# Patient Record
Sex: Female | Born: 1945 | Race: Black or African American | Hispanic: No | Smoking: Former smoker
Health system: Southern US, Community
[De-identification: ages and names within clinical notes are randomized; demographics above are authoritative.]

## PROBLEM LIST (undated history)

## (undated) DIAGNOSIS — F22 Delusional disorders: Secondary | ICD-10-CM

## (undated) DIAGNOSIS — K219 Gastro-esophageal reflux disease without esophagitis: Secondary | ICD-10-CM

## (undated) DIAGNOSIS — B370 Candidal stomatitis: Secondary | ICD-10-CM

## (undated) DIAGNOSIS — M79642 Pain in left hand: Secondary | ICD-10-CM

## (undated) DIAGNOSIS — F0392 Unspecified dementia, unspecified severity, with psychotic disturbance: Secondary | ICD-10-CM

## (undated) DIAGNOSIS — G4700 Insomnia, unspecified: Secondary | ICD-10-CM

## (undated) DIAGNOSIS — I739 Peripheral vascular disease, unspecified: Secondary | ICD-10-CM

## (undated) DIAGNOSIS — E559 Vitamin D deficiency, unspecified: Secondary | ICD-10-CM

## (undated) DIAGNOSIS — H3320 Serous retinal detachment, unspecified eye: Secondary | ICD-10-CM

## (undated) DIAGNOSIS — I10 Essential (primary) hypertension: Secondary | ICD-10-CM

## (undated) DIAGNOSIS — N3281 Overactive bladder: Secondary | ICD-10-CM

## (undated) DIAGNOSIS — F0390 Unspecified dementia without behavioral disturbance: Secondary | ICD-10-CM

## (undated) DIAGNOSIS — M1990 Unspecified osteoarthritis, unspecified site: Secondary | ICD-10-CM

## (undated) DIAGNOSIS — M549 Dorsalgia, unspecified: Secondary | ICD-10-CM

## (undated) DIAGNOSIS — E785 Hyperlipidemia, unspecified: Secondary | ICD-10-CM

## (undated) DIAGNOSIS — H547 Unspecified visual loss: Secondary | ICD-10-CM

## (undated) DIAGNOSIS — G8929 Other chronic pain: Secondary | ICD-10-CM

## (undated) DIAGNOSIS — I129 Hypertensive chronic kidney disease with stage 1 through stage 4 chronic kidney disease, or unspecified chronic kidney disease: Secondary | ICD-10-CM

## (undated) DIAGNOSIS — R0989 Other specified symptoms and signs involving the circulatory and respiratory systems: Secondary | ICD-10-CM

## (undated) DIAGNOSIS — D638 Anemia in other chronic diseases classified elsewhere: Secondary | ICD-10-CM

## (undated) DIAGNOSIS — D692 Other nonthrombocytopenic purpura: Secondary | ICD-10-CM

## (undated) DIAGNOSIS — R443 Hallucinations, unspecified: Secondary | ICD-10-CM

## (undated) HISTORY — DX: Insomnia, unspecified: G47.00

## (undated) HISTORY — DX: Morbid (severe) obesity due to excess calories: E66.01

## (undated) HISTORY — DX: Unspecified dementia, unspecified severity, with psychotic disturbance: F03.92

## (undated) HISTORY — DX: Unspecified dementia without behavioral disturbance: F03.90

## (undated) HISTORY — DX: Hypertensive chronic kidney disease with stage 1 through stage 4 chronic kidney disease, or unspecified chronic kidney disease: I12.9

## (undated) HISTORY — DX: Candidal stomatitis: B37.0

## (undated) HISTORY — DX: Dorsalgia, unspecified: M54.9

## (undated) HISTORY — DX: Serous retinal detachment, unspecified eye: H33.20

## (undated) HISTORY — DX: Gastro-esophageal reflux disease without esophagitis: K21.9

## (undated) HISTORY — DX: Other specified symptoms and signs involving the circulatory and respiratory systems: R09.89

## (undated) HISTORY — PX: CATARACT EXTRACTION, BILATERAL: SHX1313

## (undated) HISTORY — DX: Hyperlipidemia, unspecified: E78.5

## (undated) HISTORY — DX: Pain in left hand: M79.642

## (undated) HISTORY — DX: Anemia in other chronic diseases classified elsewhere: D63.8

## (undated) HISTORY — DX: Other nonthrombocytopenic purpura: D69.2

## (undated) HISTORY — DX: Hallucinations, unspecified: R44.3

## (undated) HISTORY — DX: Other chronic pain: G89.29

## (undated) HISTORY — DX: Vitamin D deficiency, unspecified: E55.9

## (undated) HISTORY — DX: Peripheral vascular disease, unspecified: I73.9

## (undated) HISTORY — DX: Unspecified osteoarthritis, unspecified site: M19.90

## (undated) HISTORY — DX: Unspecified dementia without behavioral disturbance: F22

## (undated) HISTORY — DX: Overactive bladder: N32.81

---

## 2004-12-14 ENCOUNTER — Ambulatory Visit (HOSPITAL_COMMUNITY): Admission: RE | Admit: 2004-12-14 | Discharge: 2004-12-14 | Payer: Self-pay | Admitting: Internal Medicine

## 2004-12-28 ENCOUNTER — Encounter
Admission: RE | Admit: 2004-12-28 | Discharge: 2004-12-28 | Payer: Self-pay | Admitting: Physical Medicine and Rehabilitation

## 2005-08-16 ENCOUNTER — Emergency Department (HOSPITAL_COMMUNITY): Admission: EM | Admit: 2005-08-16 | Discharge: 2005-08-17 | Payer: Self-pay | Admitting: Emergency Medicine

## 2005-08-17 ENCOUNTER — Ambulatory Visit (HOSPITAL_COMMUNITY): Admission: RE | Admit: 2005-08-17 | Discharge: 2005-08-17 | Payer: Self-pay | Admitting: Emergency Medicine

## 2006-02-10 ENCOUNTER — Inpatient Hospital Stay (HOSPITAL_COMMUNITY): Admission: EM | Admit: 2006-02-10 | Discharge: 2006-02-12 | Payer: Self-pay | Admitting: Internal Medicine

## 2006-02-11 ENCOUNTER — Ambulatory Visit: Payer: Self-pay | Admitting: Cardiology

## 2006-02-11 ENCOUNTER — Encounter: Payer: Self-pay | Admitting: Vascular Surgery

## 2006-02-11 ENCOUNTER — Encounter: Payer: Self-pay | Admitting: Cardiology

## 2007-03-08 ENCOUNTER — Inpatient Hospital Stay (HOSPITAL_COMMUNITY): Admission: EM | Admit: 2007-03-08 | Discharge: 2007-03-13 | Payer: Self-pay | Admitting: Emergency Medicine

## 2007-12-18 ENCOUNTER — Ambulatory Visit: Payer: Self-pay | Admitting: Gastroenterology

## 2008-01-18 ENCOUNTER — Encounter: Payer: Self-pay | Admitting: Gastroenterology

## 2008-01-18 ENCOUNTER — Ambulatory Visit: Payer: Self-pay | Admitting: Gastroenterology

## 2008-01-19 ENCOUNTER — Encounter: Payer: Self-pay | Admitting: Gastroenterology

## 2009-06-05 IMAGING — CR DG ELBOW COMPLETE 3+V*R*
4 series · 4 of 4 positions shown · non-contrast
Comparison: none

CLINICAL DATA: Right arm pain following a fall.

RIGHT ELBOW - 4 VIEW

[view not recorded (1 of 4)]
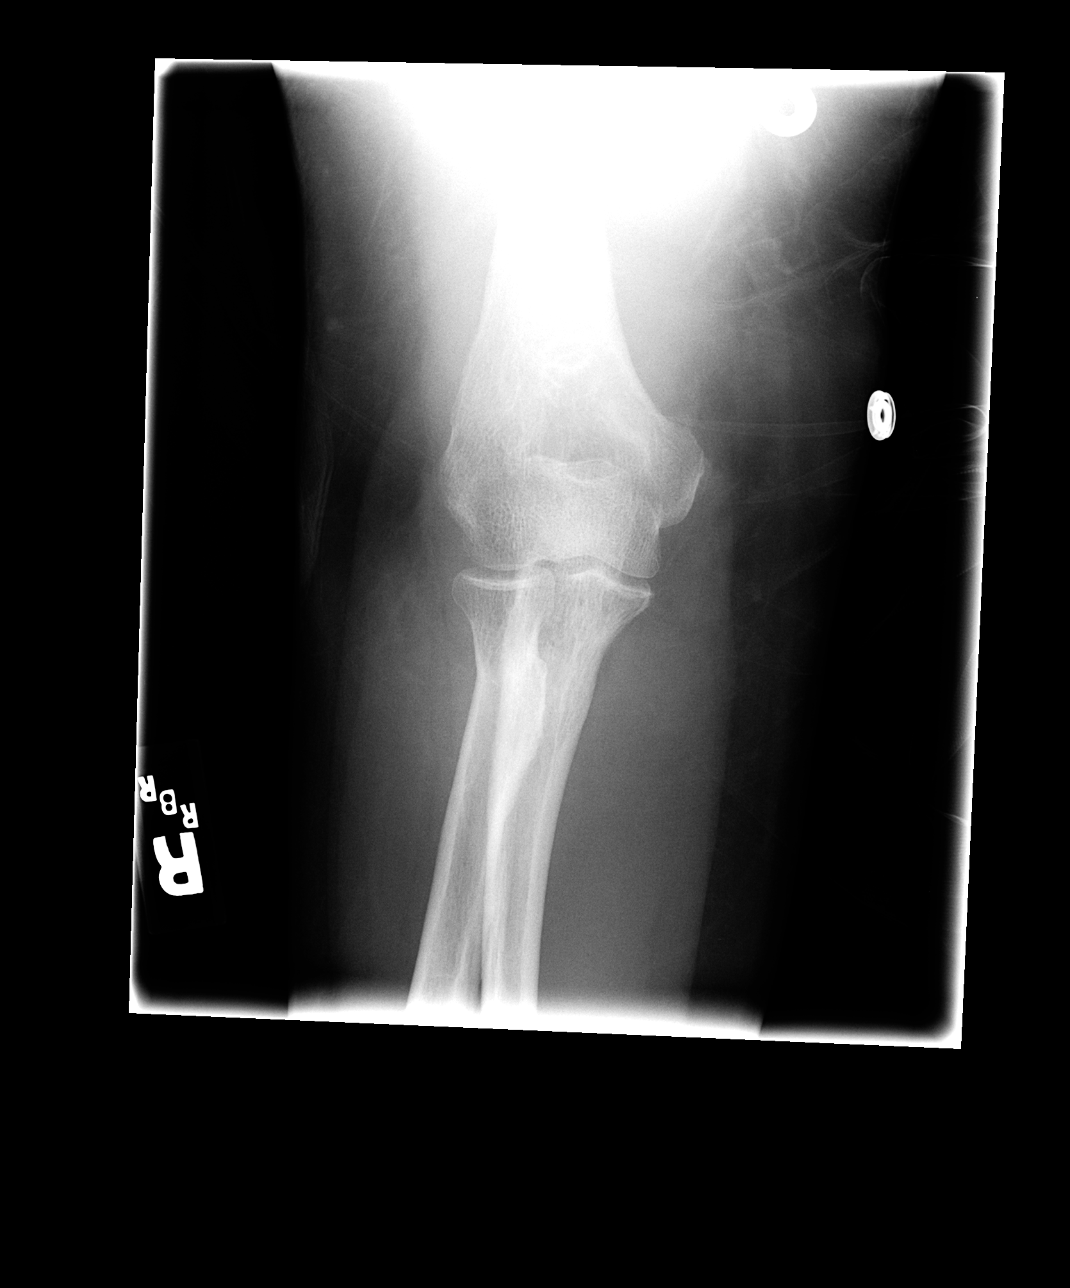

[view not recorded (2 of 4)]
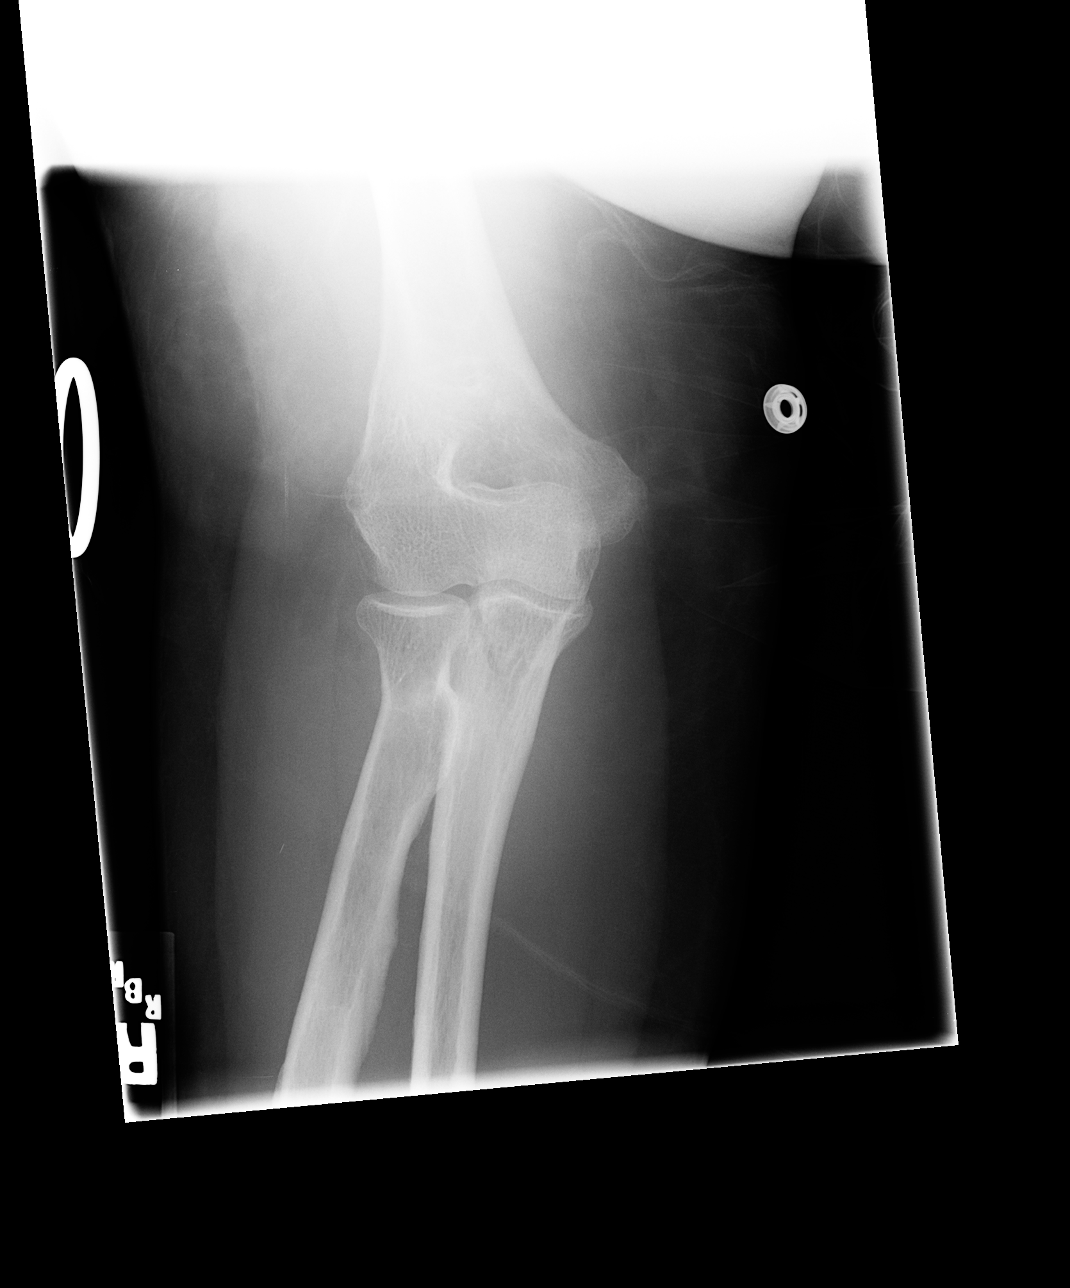

[view not recorded (3 of 4)]
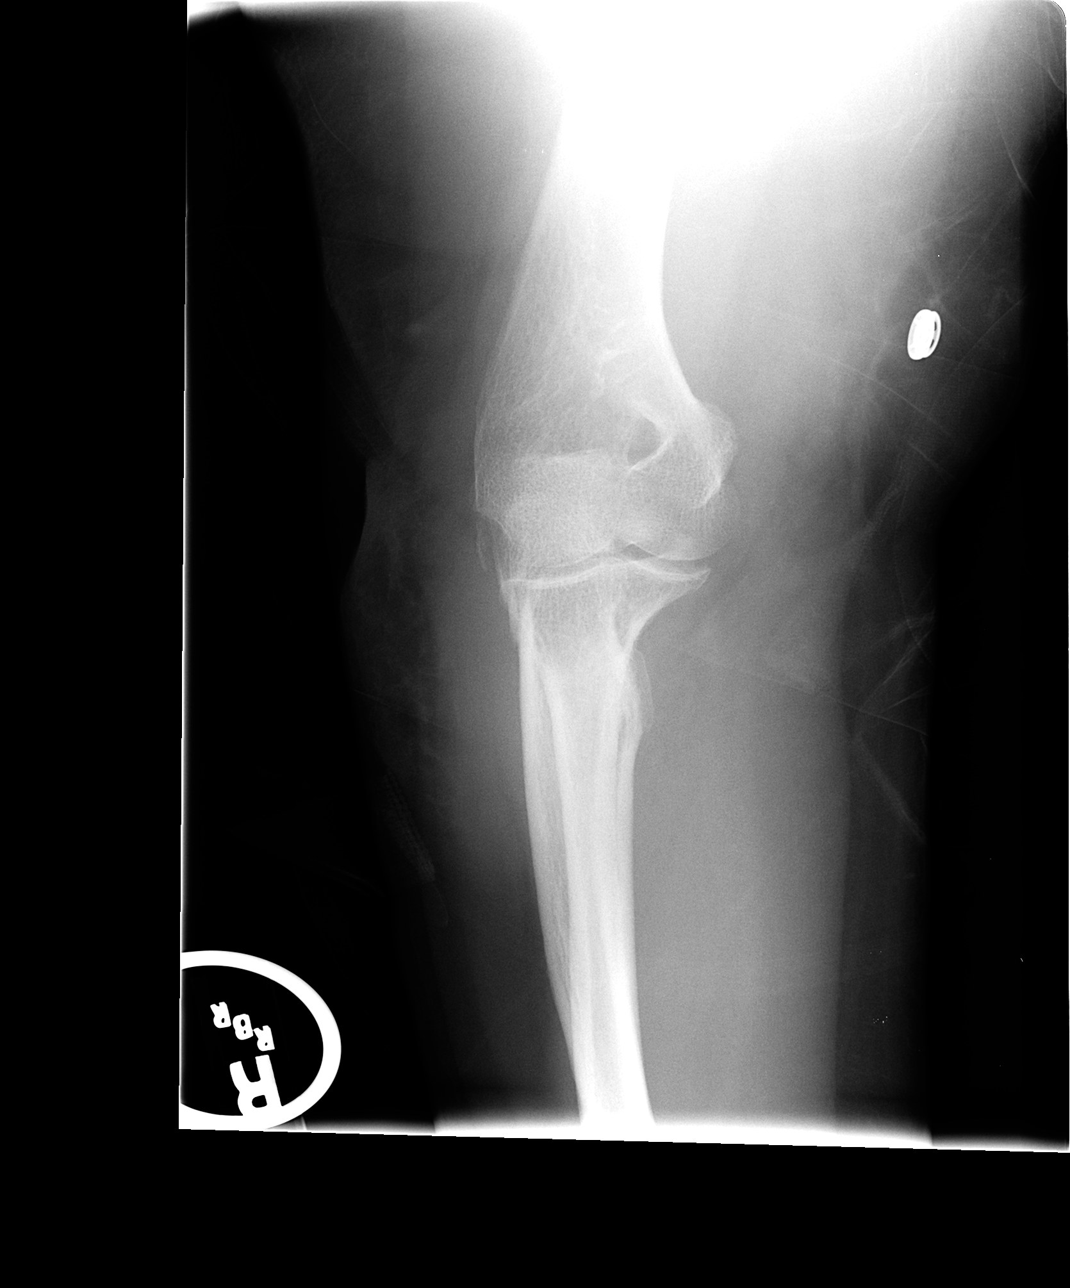

[view not recorded (4 of 4)]
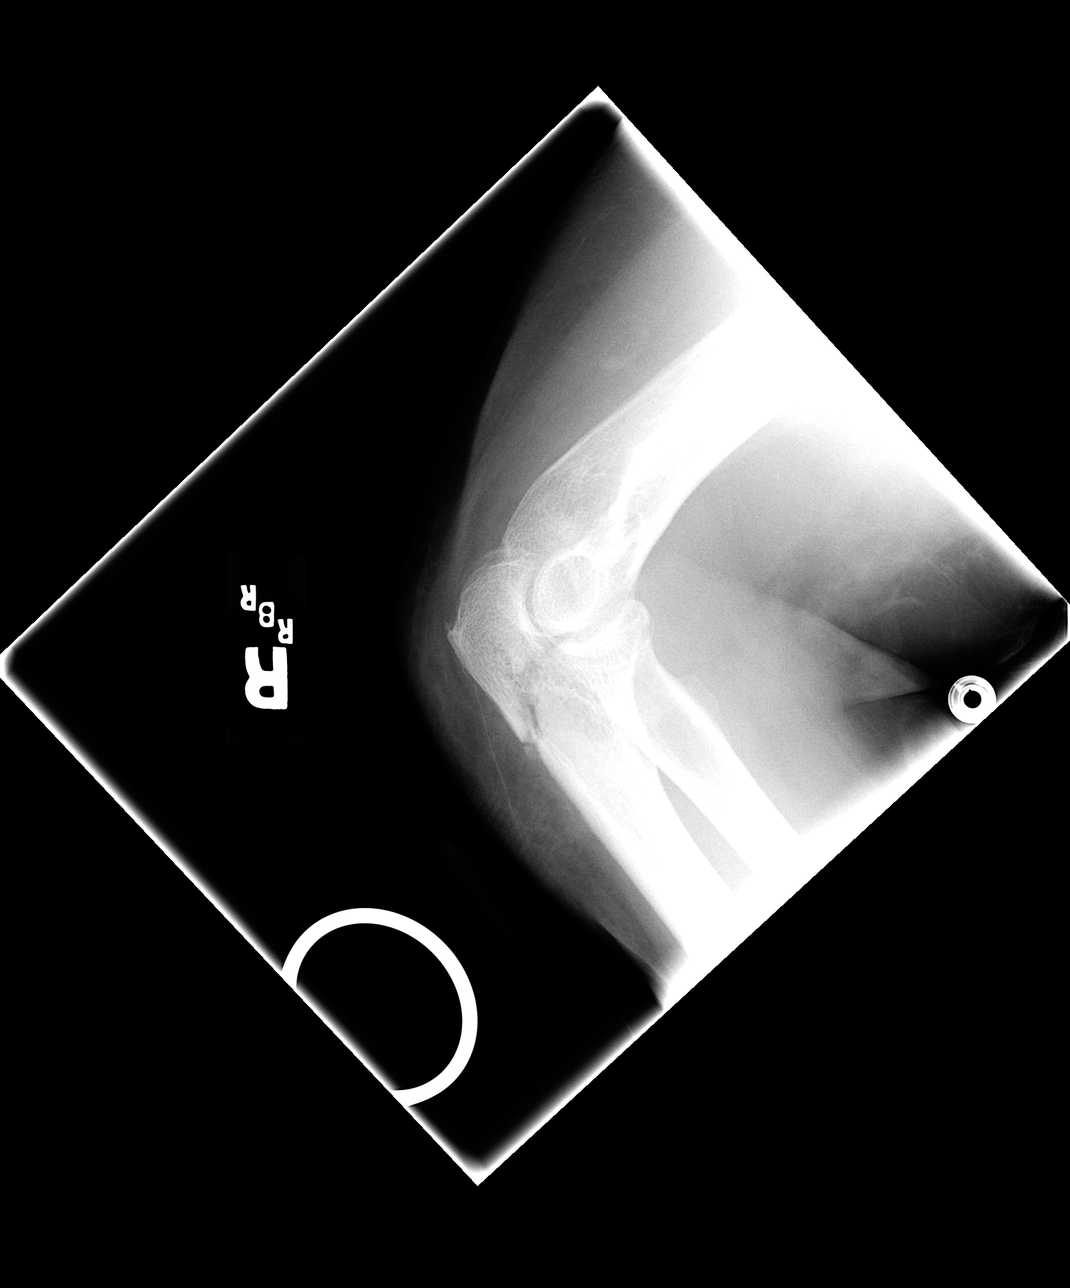

[4 of 4 positions shown; findings below may reference images not displayed]

FINDINGS: Fracture through the proximal olecranon. This is mildly comminuted
with minimal posterior displacement of the distal fragment. An associated elbow
joint effusion is noted.

IMPRESSION

Olecranon fracture with an associated elbow joint effusion.

## 2009-06-05 IMAGING — CR DG HUMERUS 2V *R*
2 series · 2 of 2 positions shown · non-contrast
Comparison: none

CLINICAL DATA: Severe right arm pain following a fall.

RIGHT HUMERUS - 2 VIEW

[view not recorded (1 of 2)]
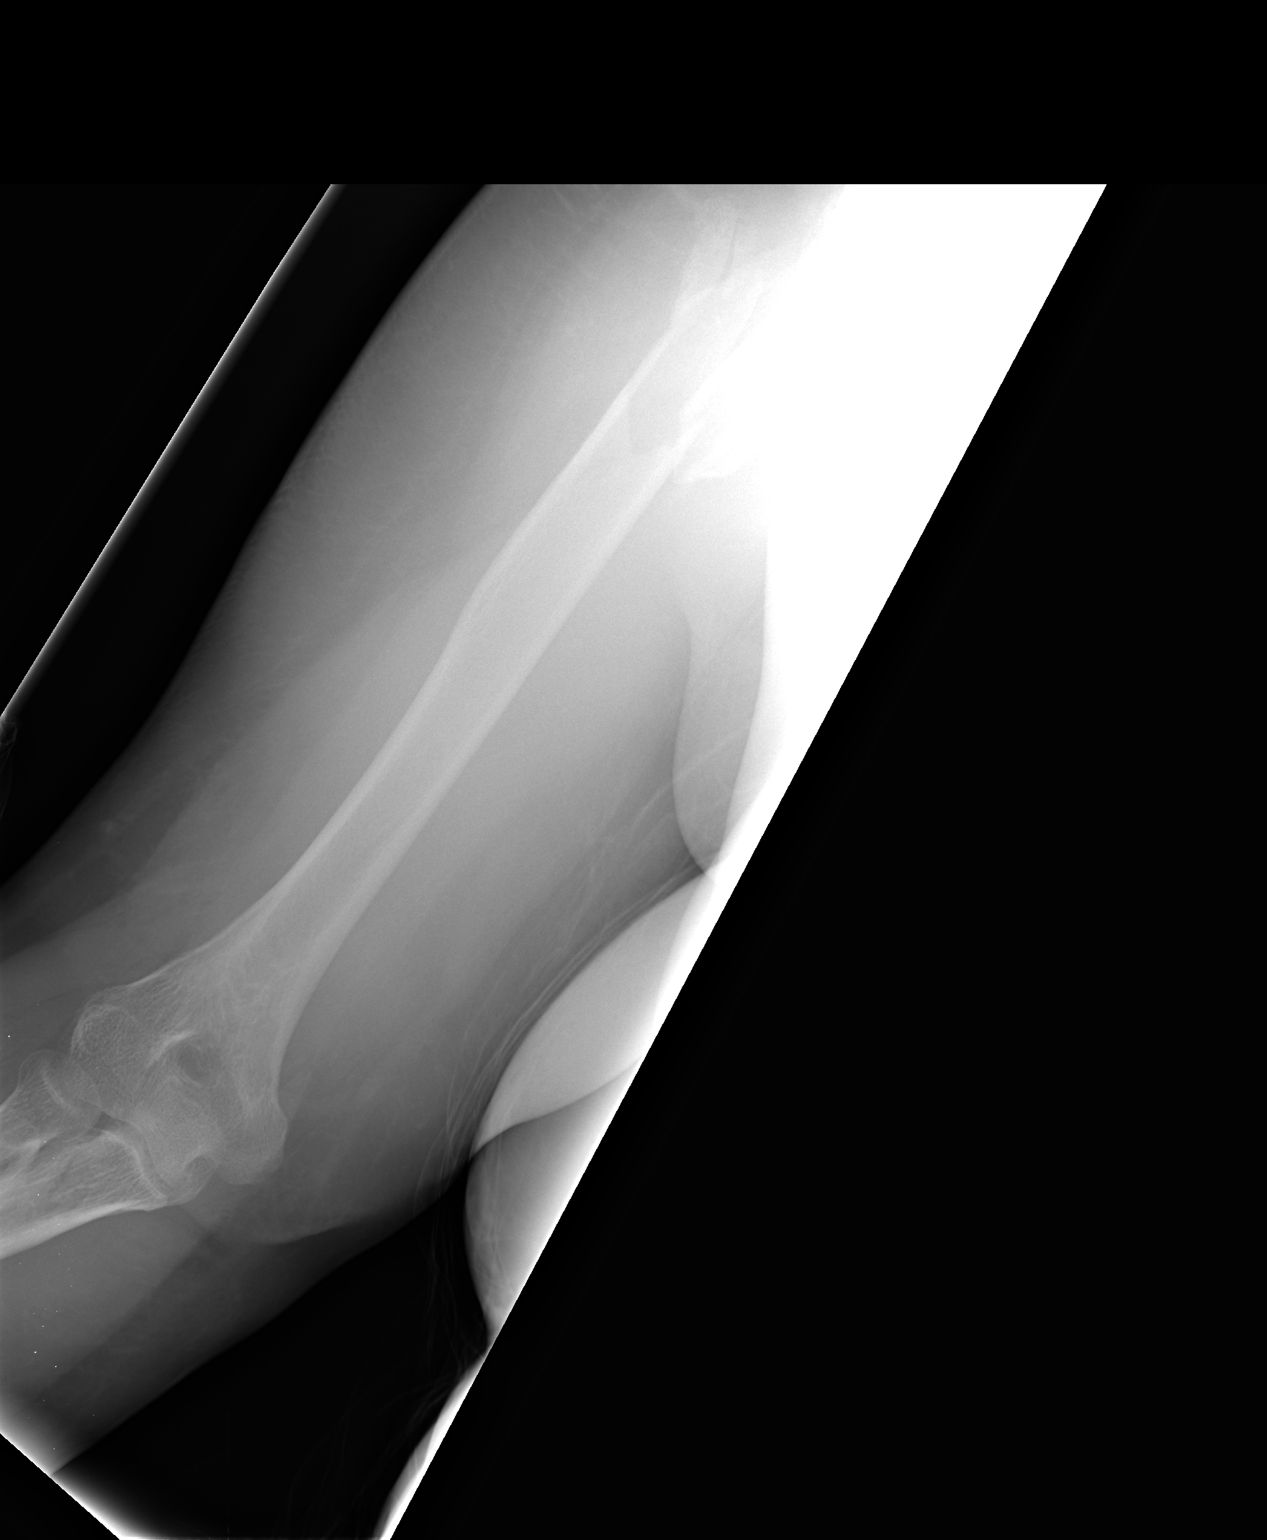

[view not recorded (2 of 2)]
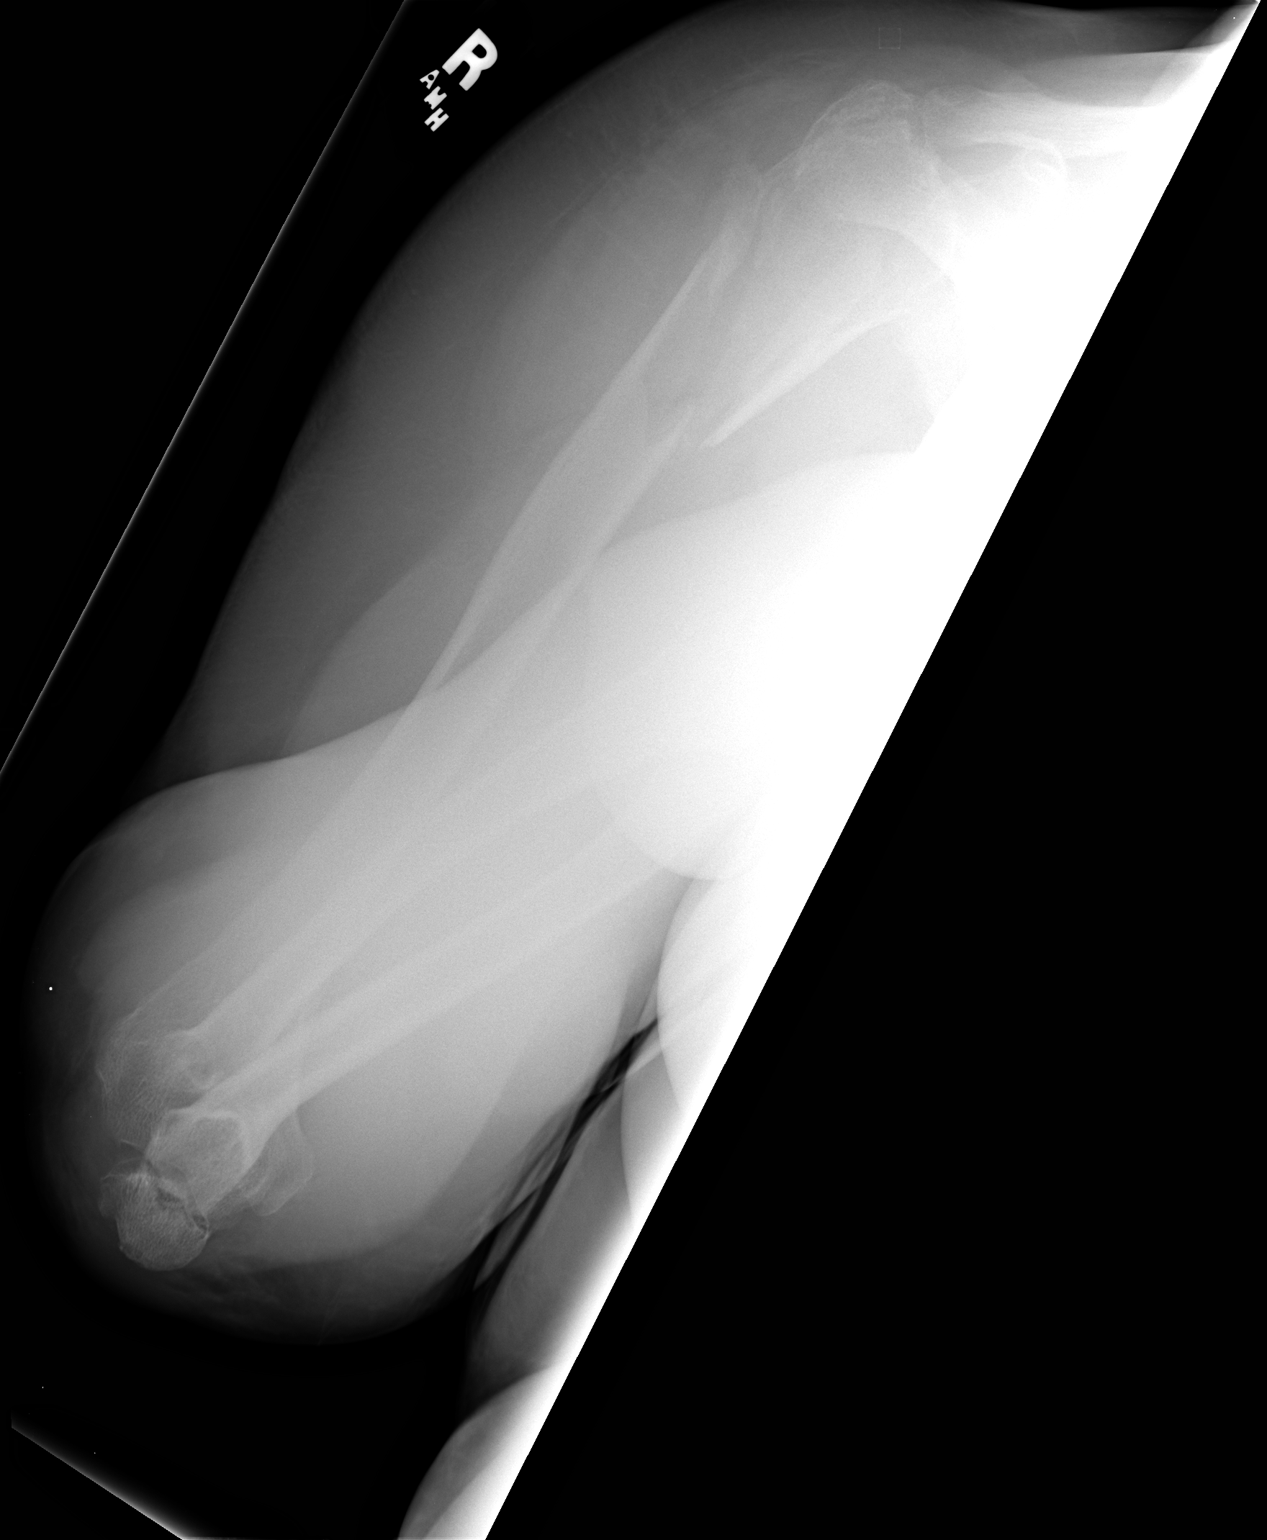

[2 of 2 positions shown; findings below may reference images not displayed]

FINDINGS: Comminuted proximal right humerus fracture, including the shaft, neck
and head of the humerus. There is three-fourths shaft width of the lateral
displacement of the distal fragment with mild medial angulation of the distal
fragment. There is also one-third shaft width of posterior displacement and mild
anterior angulation of the distal fragment.

IMPRESSION

Comminuted proximal humerus fracture, as described above.

## 2010-06-08 ENCOUNTER — Encounter: Payer: Self-pay | Admitting: Cardiovascular Disease

## 2010-06-08 DIAGNOSIS — I739 Peripheral vascular disease, unspecified: Secondary | ICD-10-CM | POA: Insufficient documentation

## 2010-06-11 ENCOUNTER — Ambulatory Visit: Payer: Self-pay

## 2010-09-11 NOTE — Miscellaneous (Signed)
Summary: Orders Update  Clinical Lists Changes  Problems: Added new problem of PVD (ICD-443.9) Orders: Added new Test order of Arterial Duplex Lower Extremity (Arterial Duplex Low) - Signed 

## 2010-12-25 NOTE — Assessment & Plan Note (Signed)
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  She is referred today through the courtesy of Dr. Wylene Simmer for screening  colonoscopy.   [name] really denies any GI complaints such as early satiety,  nausea and vomiting, reflux symptoms, bowel irregularity, melena or  hematochezia.  She actually follows a fairly regular diet, is having  normal bowel movements and no abdominal pain.  She did have a  colonoscopy some 20 years ago in [place] which was apparently  normal.  She does not give any history of food intolerances or lactose  intolerance.  Her appetite is good and her weight is stable.   PAST MEDICAL HISTORY:  Remarkable for adult onset insulin-dependent  diabetes with associated peripheral vascular disease consisting mostly  of pain in her left leg.  She is on aspirin and Pletal.  She was  hospitalized in July of 2008 with a fracture of her right upper  extremity requiring surgical intervention.  At that time she also was  noticed to have hyperlipidemia, obesity, glaucoma, chronic renal  insufficiency, and chronic anemia requiring 2 unit transfusion.  I  cannot see at that time where any specific gastrointestinal evaluation  was completed.  She is followed by Dr. Wylene Simmer and a recent hemoglobin  A1c was 6.9% with a normal metabolic profile but I do not have  a CBC  with her blood work.  Other medical problems have included obesity and  hypertensive cardiovascular disease but the patient has never had an MI  or a CVA.  She does have pain in her left thigh area related to  peripheral vascular disease.   MEDICATIONS:  1. Lantus  insulin 30 units at bedtime.  2. Catapres refills every Saturday.  3. Amaryl 4 mg twice a day.  4. Demadex 20 mg a day.  5. Exforge 10/320 one a day.  6. Pletal 50 mg at bedtime.  7. Omeprazole 20 mg a day.  8. Labetalol 100 mg twice a day.  9. Colace 100 mg twice a day.  10.Aspirin 81 mg a day.  11.Os-Cal with Vitamin D twice a day.  12.Prednisone eye drops four times a day.  13.Vitamin D 2000 mg a day.  14.Metformin 1000 mg twice a day.  15.Januvia daily.  16.She does use p.r.n. MiraLax for mild constipation.   She denies drug allergies.   FAMILY HISTORY:  Remarkable for a brother with colon cancer.  Several  family members have atherosclerosis and diabetes.   SOCIAL HISTORY:  She is single and is retired, lives with her daughter.  She denies abuse of ethanol or cigarettes.   REVIEW OF SYSTEMS:  Apparently noncontributory without any current  cardiovascular, pulmonary, genitourinary, or neuropsychiatric problems.   She is an elderly-appearing white female in no acute distress.  She is 5  feet 5 inches and weighs 275 pounds.  Blood pressure is 120/62 and pulse  was 84 and regular.  I could not appreciate definite stigmata of chronic  liver disease.  CHEST:  Clear and she appeared to be in a regular rhythm without  murmurs, gallops or rubs.  ABDOMEN:  Showed rather marked obesity without definite organomegaly,  masses or tenderness.  Bowel sounds were normal.  PERIPHERAL EXTREMITIES:  Unremarkable.  MENTAL STATUS:  Clear.  RECTAL EXAM:  Deferred.   ASSESSMENT:  1. Mild chronic constipation with a history of iron deficiency anemia      in a 65 year old diabetic female with a family history of colon      carcinoma in her brother.  Of course, need to exclude underlying      colonic polyposis or carcinoma.  2. 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  3. Marked obesity.  4. Hypertensive cardiovascular disease.  5.  Peripheral vascular disease.  6. Hyperlipidemia.  7. Chronic acid reflux on omeprazole.  8. History of chronic low back pain.  9. History of glaucoma.   RECOMMENDATIONS:  1. Outpatient colonoscopy exam with adjustment in her anticoagulants      and her diabetic medications.  2. Continue all other medications per Dr. Wylene Simmer.  3. We will order Pletal and aspirin 5 days before procedures and do      our standard diabetic medication control changes for her      procedures.    [name]. Catherine Motto, MD, Caleen Essex, FAGA  Electronically Signed   DRP/MedQ  DD: 12/18/2007  DT: 12/18/2007  Job #: [id number]   cc:   Gaspar Garbe, M.D.

## 2010-12-25 NOTE — H&P (Signed)
NAME:  [name], [name]             ACCOUNT NO.:  [id number]   MEDICAL RECORD NO.:  [id number]           PATIENT TYPE:   LOCATION:                                 FACILITY:   PHYSICIAN:  Jene Every, M.D.         DATE OF BIRTH:   DATE OF ADMISSION:  DATE OF DISCHARGE:                              HISTORY & PHYSICAL   CHIEF COMPLAINT:  Right arm pain.   HISTORY:  This is a 65 year old female who fell this morning down  stairs, no loss of consciousness, onto her right arm.  She had  complaints of arm pain.  She in the emergency room was diagnosed  initially with a fracture of the proximal humerus.  This was noted to be  comminuted.  With subsequent work-up she was found to have an intra-  articular nondisplaced fracture of the olecranon as well as an impacted  fracture of the distal radius and ulna.  The patient is a previous  patient of Dr. Ethelene Hal and therefore I was consulted for evaluation.   PAST MEDICAL HISTORY:  Her past medical history is significant for:   1. Insulin-dependent diabetes.  2. Hypertension.  3. She is legally blind.   SOCIAL HISTORY:  Tobacco negative.  Alcoholic beverages negative.   PHYSICAL EXAMINATION:  GENERAL APPEARANCE:  Morbidly obese female in no  distress.  Mood and affect are appropriate.  ARM:  Inspection of the extremity reveals some ecchymosis noted in the  medial proximal arm.  Moderate swelling is noted.  Compartments were  soft.  FOREARM:  Minimal swelling.  Compartments soft.  Some tenderness to  palpation distal radius and ulna and over the olecranon.  The skin was  intact.  She has good grip strength distally; 1+ radial and ulnar  pulses, this was confirmed with Doppler.  Good capillary refill was  noted distally.  BACK:  Nontender over the cervical spine.  Thoracic spine nontender.  ABDOMEN:  Soft, nontender.  EXTREMITIES:  Lower extremities and left upper extremity were  unremarkable.   RADIOLOGIC FINDINGS:  Radiographs of the  elbow demonstrated a  nondisplaced olecranon fracture.   Radiographs of the radius and ulna demonstrated an impacted nondisplaced  fracture of the radius and ulna.   Radiographs and CT scan of the humerus demonstrate a comminuted surgical  neck fracture with some displacement, and a fracture line extending into  the humeral head was noted as well.  The __________ was located.   IMPRESSION:  1. Closed fractures of the left upper extremity including a surgical      neck fracture with moderate overlapping and intra-articular humeral      head fracture with a located glenohumeral joint.  2. Nondisplaced intra-articular olecranon fracture.  3. Impacted fracture of the distal radius and ulna, neurologically      intact, good pulses distally, moderate swelling of the shoulder, in      an insulin-dependent diabetic with hypertension, legally blind,      morbidly obese.   PLAN AND RECOMMENDATIONS:  1. Provisionally place her into a coaptation posterior splint and a  sling.  Will elevate the extremity to her side and above her heart      for hemic control, ice to the affected area.  2. Consult Dr. Edwyna Shell for medical management and discuss the situation      with Dr. Edwyna Shell directly including medical management for possible      osteoporosis.  3. Discuss with the family and the patient the possibility of ORIF of      the fracture of the humerus and of the olecranon, though may choose      to treat this conservatively given her comorbid medical factors.      Admit her for pain control, __________  control.  4. Discuss this with Dr. Melvyn Novas with decision upon ORIF pending.      Jene Every, M.D.  Electronically Signed     JB/MEDQ  D:  03/08/2007  T:  03/08/2007  Job:  [id number]

## 2010-12-25 NOTE — Consult Note (Signed)
[name], [name]             ACCOUNT NO.:  [id number]   MEDICAL RECORD NO.:  [id number]          PATIENT TYPE:  EMS   LOCATION:  [facility]                         FACILITY:  [facility]   PHYSICIAN:  [name], M.D.   DATE OF BIRTH:  05-05-1946   DATE OF CONSULTATION:  03/08/2007  DATE OF DISCHARGE:                                 CONSULTATION   REFERRING PHYSICIAN:  Jene Walls, M.D., orthopedic surgery.   CHIEF COMPLAINT:  Right arm fracture, asked to see for medical issues.   HISTORY OF PRESENT ILLNESS:  [name] is a 65 year old female with  past medical history as listed below.  She is completely blind due to  diabetic retinopathy.  She walked out of her bedroom and instead of  turning the proper way, she stepped the wrong way and actually fell down  a flight of stairs.  She denies any syncope or neck pain or headache or  lower extremity weakness.  However, she has suffered multiple fractures  to her right upper extremity including a comminuted proximal humerus  fracture, distal, radius and ulnar fractures, proximal carpal dorsal  avulsion fracture and an olecranon fracture with an elbow joint  effusion.   PAST HISTORY:  1. Complete blindness, presumed due to diabetic retinopathy.  She has      had previous laser procedures.  2. Long standing diabetes since 1985.  3. Hypertension.  4. History of edema and cellulitis in 2007.  At that time her Norvasc      and Actos were stopped due to swelling.  Since then, her Norvasc      has been reinstituted.  5. Hyperlipidemia.  6. Obesity.  7. Peripheral arterial disease since 2002.  8. Chronic back pain.  9. Status post cataract removal and glaucoma.  10.She had a two-dimensional echocardiogram done in July 2007, showing      a normal ejection fraction of 60% with normal left ventricular      size, mild increased aortic valve thickness and trivial pulmonic      regurgitation.  She reports she has seen a cardiologist in  the      past, perhaps a year ago.  She is not exactly clear which group,      but she states that she may have had a stress test about a year ago      which was reported as okay.   ALLERGIES:  NONE.   MEDICATIONS:  1. Catapres TTS III changed once a week.  2. Exforge unknown dose.  3. Lantus 75 units twice a day.  4. Glucophage XR 2000 mg daily.  5. Amaryl 4 mg twice a day.  6. Byetta 10 mcg twice a day.  7. Pletal twice a day.  8. Demadex 20 mg daily.  9. Cosopt eye drops as well as two other eye drops which she cannot      recall at the time.  10.In the emergency room she had been given morphine 4 mg, Zofran 4      mg, Dilaudid 1 mg on two separate doses.   SOCIAL HISTORY:  She lives  in [place], [place].  She lives  at home with her son and daughter-in-law and their three children.  There is no alcohol, drug or tobacco use.  She is divorced.  She has a  high school education.  She is a retired Agricultural engineer.  She had a  remote 15 pack-year smoking history but quit in 1978.   FAMILY HISTORY:  Father died at age 90 of a lumberjack accident.  Mother  died at age 73 of diabetes and stroke.   Her last A1c is she states with 6.9 in May 2008.  She denies any chest  pains.  She does have chronic dyspnea on exertion which is unchanged.  She denies any orthopnea or paroxysmal nocturnal dyspnea.  She sleeps on  three pillows but occasionally wakes up lying flat and says that she  does not get short of breath with this.  She denies any tachy  palpitations.  Her edema is stable and basically nonexistent if she  stays on her Demadex.  She denies any blood from above or below or  recent fevers.   PHYSICAL EXAMINATION:  VITAL SIGNS:  Temperature 98.4, blood pressure  150/77, then 103/67, pulse was initially 94, now 71.  There is a 97%  saturation on 2 liters of oxygen, respiratory 16.  GENERAL APPEARANCE:  She is sitting in bed in no acute distress.  She is  alert and  oriented x4.  HEENT:  She is blind.  SKIN:  Moist.  LUNGS:  Clear to auscultation bilaterally with no wheezes, rales or  rhonchi.  HEART:  Regular rate and rhythm with a 1-2/6 murmur in systole at the  left and right sternal border.  ABDOMEN:  Soft and nontender.  There is no significant edema.  She  grossly has good sensation to her feet.  She moves extremities x3 but  her right arm is basically lying limp to her right side.  There is  bruising noted at the shoulder area and over the dorsal surface of the  hand.   LABORATORY DATA:  Chest x-ray shows stable cardiac enlargement but no  acute findings.  Again, plane film showed comminuted proximal humerus  fracture and distal radius and ulnar fracture and proximal carpal dorsal  avulsion fracture as well as an olecranon fracture with an elbow joint  effusion.   ASSESSMENT/PLAN:  Unfortunate 65 year old female with severe fractures  to her right upper extremity, admission per orthopedics.  I suggest  telemetry monitoring, would continue oxygen as well as she will be  requiring significant pain medication.  I will defer deep venous  thrombosis prophylaxis to the orthopedic team.  I will check an  electrocardiogram.  We will continue her home medications.  However, I  will suspend her Amaryl and Byetta and use sliding scale of NovoLog  insulin in place of this.  If any surgery is planned, I would want for  her to have cardiac clearance first by her cardiology group.  We will  follow with you.  Dr. Wylene Walls is out this week and I will follow this  patient.           ______________________________  [name], M.D.     MAP/MEDQ  D:  03/08/2007  T:  03/09/2007  Job:  [id number]

## 2010-12-25 NOTE — Discharge Summary (Signed)
NAMEHAYLY, [name]             ACCOUNT NO.:  [id number]   MEDICAL RECORD NO.:  [id number]          PATIENT TYPE:  INP   LOCATION:  4743                         FACILITY:  [facility]   PHYSICIAN:  Mark A. Perini, M.D.   DATE OF BIRTH:  [birth year]   DATE OF ADMISSION:  03/08/2007  DATE OF DISCHARGE:  03/13/2007                               DISCHARGE SUMMARY   DISCHARGE DIAGNOSES:  1. Closed fractures of the right upper extremity described as a      surgical neck fracture with moderate overlapping and intra-      articular humeral head fracture with a located glenohumeral joint.      Nondisplaced intra-articular olecranon fracture and impacted      fracture of the distal radius and ulna, neurologically intact.  2. Blindness.  3. Type 2 diabetes, insulin dependent.  4. Chronic edema.  5. Hyperlipidemia.  6. Morbid obesity.  7. Peripheral arterial disease.  8. Chronic back pain.  9. Glaucoma.  10.Acute renal insufficiency in the setting of chronic kidney disease,      improved with hydration.  11.Anemia requiring 2 units of packed red cells and one dose of      Aranesp this admission. She does have some iron deficiency noted on      her iron studies. No obvious GI bleeding noted.  12.Urinary retention, resolved.   PROCEDURE:  Orthopedic consultation.   CT scan of the right upper extremity showing the above noted fractures.   DISCHARGE MEDICATIONS:  1. Catapres patch 0.3 mg per day patch, change once a week.  2. Glucophage XR 2000 mg once daily with food.  3. Pletal 500 mg twice daily.  4. Protonix 40 mg daily.  5. Colace 100 mg twice a day.  6. Sliding scale of NovoLog insulin before meals; if sugar is less      than 60 - hypoglycemia protocol, if sugar is 60 to 100 - no units,      sugar 101 to 150 - 2 units, 151 to 200 - 3 units, sugar 201 to 250      - 5 units, CBG 251 to 300 - 7 units, CBG 301 to 350 - 9 units, CBG      greater than 350 - 11 units, and CBG greater than 400  - call M.D.      She is also to be on Lantus insulin 50 units subcutaneous twice a      day.  7. Aspirin 81 mg daily.  8. MiraLax 17 grams in 8 ounces of water once daily.  9. Cosopt eye drops 1 drop to right eye twice a day.  10.TobraDex eye drops 1 drop to right eye 3 times a day.  11.Prednisolone forte 1% 1 drop to right eye 4 times a day.  12.Diovan 160 mg once daily.  13.Percocet 5/325 one to two tablets every 6 hours as needed for pain.  14.Senokot S 1 tablet every night p.r.n.  15.Robaxin 500 mg p.o. q.6h. as needed for muscle pain or spasm.   At the current time, we are not continuing her Norvasc or her  Amaryl or  Byetta. Furthermore, her Demadex is on hold right now. She was somewhat  dehydrated this admission, and she has had no swelling currently. If she  starts to have increased swelling, she may need her Demadex  reinstituted.   HISTORY OF PRESENT ILLNESS:  [name] is a pleasant 65 year old  female who fell down her stairs and had multiple fractures to her right  upper extremity. She was admitted for further care.   HOSPITAL COURSE:  [name] had her right upper extremity splinted.  It was felt that it would be too difficult to do any surgical  manipulation at the current time. She may need surgery at a later point  when the swelling improves. She develop some acute renal insufficiency  which improved with some gentle hydration. She also was anemia and  required a couple of units of red cells and one dose of Aranesp. Her  sugars remained stable. In fact, she required a reduced dose of insulin  during this stay. A vitamin D level is pending. If she is found to not  have osteomalacia, Forteo may be instituted. By March 13, 2007, she was  deemed stable for discharge to a rehabilitation facility until her  function could improve where she could return home.   DISCHARGE PHYSICAL EXAMINATION:  Temperature 97.5 - afebrile, pulse 84,  respiratory rate 20, blood  pressure 123/72. Blood sugars ranged from 70  to 125, saturation 100% on room air. She was in no acute distress,  somewhat supine in bed.  LUNGS:  Were clear to auscultation bilaterally.  HEART:  Was regular rate and rhythm with no murmur.  ABDOMEN:  Was obese but soft and nontender, and there was no peripheral  edema.   DISCHARGE LABORATORY DATA:  White count 5.6, hemoglobin 10, platelet  count 244,000. There were 69% segs, 22% lymphocytes, 6% monocytes.  Sodium 140, potassium 4.6, chloride 106, CO2 27, BUN 24, creatinine  1.03, glucose 72. GFR estimated at 54 mL per minute. Albumin 2.7,  calcium 9.6, phosphorus 2.8. TSH was 1.65. Iron level was slightly low  at 25%, saturation of iron was low at 10%, transferrin was low at 182,  B12 level was normal at 371, ferritin was 146. Her lowest hemoglobin was  8.1, and her worst BUN and creatinine were 48 and 2.98 on March 11, 2007.   DISCHARGE INSTRUCTIONS:  [name] is to work with physical therapy  and occupational therapy to improve her mobility. She is to have her  right upper extremity splinted. She is to followup with Dr. Bradly Bienenstock  at [facility] in one week. Today, she is having upright x-  rays of her right shoulder and two views of her right elbow which are  not done at the time of this dictation. She is to follow up with Dr.  Wylene Simmer in two to three weeks for further medical care. She is a full  code status. Her A1c this admission was 7.4%. It is my feeling that if  she is to proceed to a surgery, she may require cardiologist's  clearance. However, she has not had any chest pains or shortness of  breath noted this admission.           ______________________________  Redge Gainer Waynard Edwards, M.D.     MAP/MEDQ  D:  03/13/2007  T:  03/13/2007  Job:  [id number]   cc:   Jene Every, M.D.  Gaspar Garbe, M.D.

## 2010-12-28 NOTE — H&P (Signed)
NAME:  [name], [name]             ACCOUNT NO.:  [id number]   MEDICAL RECORD NO.:  [id number]          PATIENT TYPE:  OUT   LOCATION:  VASC                         FACILITY:  [facility]   PHYSICIAN:  Mark A. Perini, M.D.   DATE OF BIRTH:  1946/08/09   DATE OF ADMISSION:  02/10/2006  DATE OF DISCHARGE:                                HISTORY & PHYSICAL   CHIEF COMPLAINT:  Left greater than right lower extremity swelling, weight  gain.   HISTORY OF PRESENT ILLNESS:  Aeriel is a pleasant 65 year old female with  history of long-standing diabetes with significant microvascular  complications including diabetic retinopathy for which he is legally blind  and history of peripheral artery disease dating back to 2002.  She was last  seen in the office on Dec 25, 2005.  At that time, her Lantus was increased  to 45 units b.i.d.  She did not tolerate this due to some low sugar  reactions, and she is now taking Lantus 40 units b.i.d.  She also takes  Actos and Metformin and Amaryl for her diabetes.  She reports two weeks ago  she fell and has a scrape on her left knee.  She has, over the last few  days, noted markedly increased swelling of her left lower extremity and  calf.  She denies any chest pains or stroke-like symptoms.  She is on  Demadex 80 mg daily, but only takes it every three days because it makes her  go to the bathroom too much.  She denies any fevers.  However, she reports  gaining 50 pound since she was switched to Avandia to Actos last year and  notably had gained 21 pounds in the last month and a half.  On exam, she was  found to have JVD and an S3 gallop.  She was also noted to have markedly  increased swelling of her left lower extremity versus her right with some  warmth.  She is admitted for further evaluation.   PAST MEDICAL HISTORY:  1.  Type 2 diabetes in 1985.  2.  Hypertension.  3.  Peripheral arterial disease since 2002.  4.  Chronic back pain.  5.  Legally blind due  to diabetic retinopathy for which he has had 2 or 3      laser procedures.   ALLERGIES:  None.   MEDICATIONS:  1.  Lantus insulin 40 units subcu b.i.d.  2.  Catapres 3 patch changed to every Saturday.  3.  Amaryl 4 mg b.i.d.  4.  Metformin XR 2000 mg each evening.  5.  Diovan 320 mg daily.  6.  Demadex 80 mg daily, but she only takes about every three days.  7.  Pletal 100 mg b.i.d.  8.  Loprox gel 0.77%  b.i.d.  9.  Actos 45 mg once daily.   SOCIAL HISTORY:  She is single.  She has a high school education.  She is a  retired Agricultural engineer.  She had a 15 pack year smoking history but quit  in 1978.   FAMILY HISTORY:  Father died at age 61 in a  lumbar jack accident.  Mother  died at age 53 of diabetes and stroke.   REVIEW OF SYSTEMS:  As per the history of present illness.  She denies any  blood from above or below.  She denies any chest pain.  She sleeps on two  pillows normally and has had no definite increase in orthopnea.  She has had  some trouble swallowing over the last couple of weeks.   PHYSICAL EXAMINATION:  VITAL SIGNS:  Weight 333.5 pounds which is up 21  pounds, 98% saturation on room air, blood pressure 184/66, pulse 108.  GENERAL APPEARANCE:  No acute distress.  Morbidly obese female.  She does  have JVD noted to the angle of the jaw.  She has an S3 gallop noted on heart  exam.  LUNGS:  Clear to auscultation bilaterally.  EXTREMITIES:  She has 2+ edema at the left calf and foot and 1+ tense edema  of the right foot.  She has an abrasion which seems to be healing on the  anterior portion of the left knee.  She has some warmth of the left lower  extremity but no definite redness.  ABDOMEN:  Obese, soft and nontender.   LABORATORY DATA:  Pending.   ASSESSMENT/PLAN:  A 65 year old female with long-standing diabetes with  greater than 20 pound weight gain in six weeks with worsening lower  extremity edema and S3 gallop and JVD on physical exam.  I feel as if  she  has new onset congestive heart failure which could possibly be exacerbated  by her Actos.  Furthermore, she has asymmetric lower extremity edema with  the left side more edematous than the right.  She could have a lower  extremity DVT, and she could also have some lower extremity cellulitis.  Will admit her to a telemetry bed.  Will evaluate her lab work.  Will check  a lower extremity Doppler.  We will empirically start Lovenox at treatment  doses and we will empirically place on Keflex orally for possible  cellulitis.  She may require cardiac consultation.  We will obtain a 2D  echocardiogram as well as a chest x-ray and EKG.  Will place the patient on  oxygen and telemetry bed.  We will rule out for myocardial infarction as  well.  We will discontinue Actos and Metformin indefinitely, and she may  need to be managed entirely with insulin for her diabetes.           ______________________________  Redge Gainer Waynard Edwards, M.D.     MAP/MEDQ  D:  02/10/2006  T:  02/10/2006  Job:  [id number]   cc:   Gaspar Garbe, M.D.  Fax: [phone]

## 2010-12-28 NOTE — Discharge Summary (Signed)
[name], Walls             ACCOUNT NO.:  [id number]   MEDICAL RECORD NO.:  [id number]          PATIENT TYPE:  INP   LOCATION:  3710                         FACILITY:  [facility]   PHYSICIAN:  Mark A. Perini, M.D.   DATE OF BIRTH:  08-27-1945   DATE OF ADMISSION:  02/10/2006  DATE OF DISCHARGE:  02/12/2006                                 DISCHARGE SUMMARY   DISCHARGE DIAGNOSES:  1.  Asymmetric lower extremity edema due to Norvasc and Actos with      noncompliance with diuretic pill.  2.  Left lower extremity cellulitis.  3.  Longstanding type-2 diabetes.  4.  Hypertension.  5.  Hyperlipidemia.  6.  Obesity.  7.  Blindness.   PROCEDURES:  1.  Lower extremity Doppler ultrasound showing no evidence of DVT, SVT or      Baker's cyst.  2.  Two-dimensional echocardiogram showing a normal ejection fraction of 60%      with normal left ventricular size; wall motion could not be assessed;      there was mild increased aortic valve thickness and trivial pulmonic      regurgitation.   DISCHARGE MEDICATIONS:  1.  She is to only take one-half of her 10 mg Norvasc pill daily.  2.  She is to continue Catapres-3 patch changed every Saturday.  3.  She may resume her metformin XR 2000 mg each evening.  4.  She is to continue Diovan 320 mg daily as before.  5.  She is to take Demadex 20 mg once a day.  6.  Pletal 100 mg twice a day.  7.  She is to continue her eye drops as before.  8.  She is to discontinue Actos.  9.  She is to continue Lantus 40 units subcutaneously twice daily as before.  10. She is to continue Amaryl 4 mg twice a day as before.  11. She is to take Keflex 500 mg three times a day for eight further days.  12. Tylenol as needed.   HISTORY OF PRESENT ILLNESS:  [name] is a pleasant 65 year old female who  presented with significant lower extremity edema bilaterally with markedly  worse edema of the left lower extremity.  She did fall and have a scrape on  her knee  approximately two weeks ago and has noted some redness and warmth  of her left lower extremity as well.  She was noted to have a 21-pound  weight gain in the last six weeks.  She is admitted for further evaluation.   HOSPITAL COURSE:  [name] was admitted to a telemetry bed.  Fortunately her  BNP was less than 30 and her x-ray showed no evidence of pulmonary edema, so  it was not felt that she had congestive heart failure.  She remained in  normal sinus rhythm on telemetry.  Doppler ultrasound showed no DVT, and she  was treated empirically with Keflex for a possible cellulitis.  Her Actos  was discontinued and her Norvasc dose was reduced.  She was diuresed with  Lasix gently.  By February 12, 2006, her edema was improved to  a significant  degree although she still had trace to 1+ edema of both lower extremities at  that time.  She had no other symptomatology and was felt stable for  discharge home on February 12, 2006.   DISCHARGE PHYSICAL EXAM:  Temperature 98.0, afebrile, pulse 78, respiratory  20, blood sugars range from 70 to 204, although she did have two low sugars  noted on February 11, 2006.  Blood pressure was 136/59 and 158/62 on two previous  checks before discharge, 95% saturation on room air.  She was in no acute  distress, obese.  Lungs were clear to auscultation bilaterally.  Heart was  regular rate and rhythm with no murmur.  Abdomen was soft and nontender.  There was 1+ edema of both lower extremities.  No significant redness or  warmth of the left lower extremity at the time of discharge.   DISCHARGE LABORATORY DATA:  White count 4.1, hemoglobin 11.4, platelet count  201,000; there were 43% segs, 47% lymphocytes, 6% monocytes.  Sodium was  141, potassium 4.3, chloride 109, CO2 29, BUN 22, creatinine 1.0, glucose  103, calcium 9.5.  Other notable laboratory data at the time of discharge  included two BNPs which were less than 30, three sets of troponin-I which  were all normal, CK  enzyme was slightly elevated at 497 and 515 with a  normal MB fraction.  Coagulation studies on admission were normal.   DISCHARGE INSTRUCTIONS:  [name] is to follow a low-salt diabetic diet.  She  is to check her sugars at home twice a day before breakfast and before  supper and record these numbers and bring them to Dr. Wylene Simmer.  She is to  call for a visit with Dr. Wylene Simmer in one week.  She is to call if there are  any recurrent problems.  Furthermore, she should have a BMP and CBC  rechecked at her follow-up visit.           ______________________________  Redge Gainer. Waynard Edwards, M.D.     MAP/MEDQ  D:  02/12/2006  T:  02/12/2006  Job:  [id number]   cc:   Gaspar Garbe, M.D.  Fax: [phone]

## 2011-02-21 ENCOUNTER — Ambulatory Visit: Payer: Medicare HMO | Attending: Orthopaedic Surgery | Admitting: Physical Therapy

## 2011-02-21 DIAGNOSIS — M25519 Pain in unspecified shoulder: Secondary | ICD-10-CM | POA: Insufficient documentation

## 2011-02-21 DIAGNOSIS — IMO0001 Reserved for inherently not codable concepts without codable children: Secondary | ICD-10-CM | POA: Insufficient documentation

## 2011-03-06 ENCOUNTER — Encounter: Payer: Managed Care, Other (non HMO) | Admitting: Physical Therapy

## 2011-03-07 ENCOUNTER — Ambulatory Visit: Payer: Medicare HMO | Admitting: Physical Therapy

## 2011-03-12 ENCOUNTER — Encounter: Payer: Managed Care, Other (non HMO) | Admitting: Physical Therapy

## 2011-03-14 ENCOUNTER — Ambulatory Visit: Payer: Medicare HMO | Admitting: Physical Therapy

## 2011-03-21 ENCOUNTER — Ambulatory Visit: Payer: Medicare HMO | Attending: Orthopaedic Surgery | Admitting: Physical Therapy

## 2011-03-21 DIAGNOSIS — IMO0001 Reserved for inherently not codable concepts without codable children: Secondary | ICD-10-CM | POA: Insufficient documentation

## 2011-03-21 DIAGNOSIS — M25519 Pain in unspecified shoulder: Secondary | ICD-10-CM | POA: Insufficient documentation

## 2011-04-02 ENCOUNTER — Ambulatory Visit: Payer: Medicare HMO | Admitting: Physical Therapy

## 2011-04-04 ENCOUNTER — Ambulatory Visit: Payer: Medicare HMO | Admitting: Physical Therapy

## 2011-04-10 ENCOUNTER — Ambulatory Visit: Payer: Medicare HMO | Admitting: Physical Therapy

## 2011-04-17 ENCOUNTER — Encounter: Payer: Managed Care, Other (non HMO) | Admitting: Physical Therapy

## 2011-04-17 ENCOUNTER — Ambulatory Visit: Payer: Medicare HMO | Attending: Orthopaedic Surgery | Admitting: Physical Therapy

## 2011-04-17 DIAGNOSIS — IMO0001 Reserved for inherently not codable concepts without codable children: Secondary | ICD-10-CM | POA: Insufficient documentation

## 2011-04-17 DIAGNOSIS — M25519 Pain in unspecified shoulder: Secondary | ICD-10-CM | POA: Insufficient documentation

## 2011-04-19 ENCOUNTER — Ambulatory Visit: Payer: Medicare HMO | Admitting: Physical Therapy

## 2011-05-27 LAB — BASIC METABOLIC PANEL
BUN: 34 — ABNORMAL HIGH
BUN: 43 — ABNORMAL HIGH
CO2: 24
CO2: 26
Calcium: 8.7
Creatinine, Ser: 1.9 — ABNORMAL HIGH
Creatinine, Ser: 2.96 — ABNORMAL HIGH
GFR calc Af Amer: 32 — ABNORMAL LOW
Potassium: 3.9
Potassium: 4.4
Sodium: 132 — ABNORMAL LOW
Sodium: 139

## 2011-05-27 LAB — FOLATE RBC: RBC Folate: 686 — ABNORMAL HIGH

## 2011-05-27 LAB — RENAL FUNCTION PANEL
Albumin: 2.7 — ABNORMAL LOW
Albumin: 2.7 — ABNORMAL LOW
Albumin: 2.8 — ABNORMAL LOW
CO2: 27
CO2: 26
CO2: 27
Calcium: 8.3 — ABNORMAL LOW
Chloride: 106
Chloride: 103
Creatinine, Ser: 1.72 — ABNORMAL HIGH
Creatinine, Ser: 2.98 — ABNORMAL HIGH
GFR calc Af Amer: 19 — ABNORMAL LOW
GFR calc Af Amer: 36 — ABNORMAL LOW
GFR calc non Af Amer: 54 — ABNORMAL LOW
GFR calc non Af Amer: 16 — ABNORMAL LOW
GFR calc non Af Amer: 30 — ABNORMAL LOW
Phosphorus: 2.8
Phosphorus: 5 — ABNORMAL HIGH
Potassium: 4.6
Sodium: 140
Sodium: 133 — ABNORMAL LOW
Sodium: 136

## 2011-05-27 LAB — CBC
Hemoglobin: 10 — ABNORMAL LOW
Hemoglobin: 9.5 — ABNORMAL LOW
MCHC: 33.6
MCHC: 33.8
MCV: 88
MCV: 88.4
Platelets: 191
Platelets: 207
Platelets: 232
RBC: 3.34 — ABNORMAL LOW
RBC: 3.18 — ABNORMAL LOW
RDW: 14.1 — ABNORMAL HIGH
RDW: 14.2 — ABNORMAL HIGH
RDW: 14.4 — ABNORMAL HIGH
WBC: 5.6
WBC: 6.3
WBC: 9.2

## 2011-05-27 LAB — DIFFERENTIAL
Basophils Absolute: 0
Basophils Absolute: 0
Basophils Absolute: 0
Basophils Relative: 0
Basophils Relative: 0
Eosinophils Absolute: 0.2
Eosinophils Absolute: 0.2
Eosinophils Relative: 3
Eosinophils Relative: 2
Eosinophils Relative: 2
Eosinophils Relative: 3
Lymphocytes Relative: 30
Lymphocytes Relative: 31
Lymphs Abs: 1.9
Lymphs Abs: 2.2
Monocytes Absolute: 0.3
Neutro Abs: 3.8
Neutro Abs: 3.8
Neutro Abs: 4.6
Neutrophils Relative %: 60
Neutrophils Relative %: 67

## 2011-05-27 LAB — CARDIAC PANEL(CRET KIN+CKTOT+MB+TROPI)
CK, MB: 3.5
CK, MB: 7 — ABNORMAL HIGH
Relative Index: 0.9
Relative Index: 1
Total CK: 480 — ABNORMAL HIGH
Troponin I: 0.02
Troponin I: 0.02

## 2011-05-27 LAB — TYPE AND SCREEN: ABO/RH(D): O POS

## 2011-05-27 LAB — PREPARE RBC (CROSSMATCH)

## 2011-05-27 LAB — VITAMIN B12: Vitamin B-12: 371 (ref 211–911)

## 2011-05-27 LAB — ABO/RH: ABO/RH(D): O POS

## 2011-05-27 LAB — URINE MICROSCOPIC-ADD ON

## 2011-05-27 LAB — TRANSFERRIN: Transferrin: 182 — ABNORMAL LOW

## 2011-05-27 LAB — URINALYSIS, ROUTINE W REFLEX MICROSCOPIC
Glucose, UA: NEGATIVE
Ketones, ur: NEGATIVE
Protein, ur: NEGATIVE
Urobilinogen, UA: 1

## 2011-05-27 LAB — RETICULOCYTES
Retic Count, Absolute: 95.6
Retic Ct Pct: 2.7

## 2011-05-27 LAB — IRON AND TIBC
Saturation Ratios: 10 — ABNORMAL LOW
TIBC: 253

## 2011-05-27 LAB — TSH: TSH: 1.653

## 2011-11-17 ENCOUNTER — Encounter (HOSPITAL_COMMUNITY): Payer: Self-pay | Admitting: *Deleted

## 2011-11-17 ENCOUNTER — Inpatient Hospital Stay (HOSPITAL_COMMUNITY)
Admission: EM | Admit: 2011-11-17 | Discharge: 2011-11-25 | DRG: 872 | Disposition: A | Discharge status: Home / Self Care | Payer: Medicare HMO | Attending: Internal Medicine | Admitting: Internal Medicine

## 2011-11-17 ENCOUNTER — Emergency Department (HOSPITAL_COMMUNITY): Payer: Medicare HMO

## 2011-11-17 DIAGNOSIS — Z7982 Long term (current) use of aspirin: Secondary | ICD-10-CM

## 2011-11-17 DIAGNOSIS — IMO0002 Reserved for concepts with insufficient information to code with codable children: Secondary | ICD-10-CM | POA: Diagnosis present

## 2011-11-17 DIAGNOSIS — E669 Obesity, unspecified: Secondary | ICD-10-CM | POA: Insufficient documentation

## 2011-11-17 DIAGNOSIS — H548 Legal blindness, as defined in USA: Secondary | ICD-10-CM | POA: Diagnosis present

## 2011-11-17 DIAGNOSIS — K219 Gastro-esophageal reflux disease without esophagitis: Secondary | ICD-10-CM | POA: Diagnosis present

## 2011-11-17 DIAGNOSIS — I10 Essential (primary) hypertension: Secondary | ICD-10-CM | POA: Diagnosis present

## 2011-11-17 DIAGNOSIS — E1165 Type 2 diabetes mellitus with hyperglycemia: Secondary | ICD-10-CM | POA: Diagnosis present

## 2011-11-17 DIAGNOSIS — N179 Acute kidney failure, unspecified: Secondary | ICD-10-CM | POA: Diagnosis present

## 2011-11-17 DIAGNOSIS — M25519 Pain in unspecified shoulder: Secondary | ICD-10-CM | POA: Diagnosis present

## 2011-11-17 DIAGNOSIS — A419 Sepsis, unspecified organism: Principal | ICD-10-CM | POA: Diagnosis present

## 2011-11-17 DIAGNOSIS — L03314 Cellulitis of groin: Secondary | ICD-10-CM

## 2011-11-17 DIAGNOSIS — E118 Type 2 diabetes mellitus with unspecified complications: Secondary | ICD-10-CM

## 2011-11-17 DIAGNOSIS — D649 Anemia, unspecified: Secondary | ICD-10-CM | POA: Diagnosis present

## 2011-11-17 DIAGNOSIS — M19019 Primary osteoarthritis, unspecified shoulder: Secondary | ICD-10-CM | POA: Insufficient documentation

## 2011-11-17 DIAGNOSIS — K612 Anorectal abscess: Secondary | ICD-10-CM | POA: Diagnosis present

## 2011-11-17 HISTORY — DX: Unspecified visual loss: H54.7

## 2011-11-17 HISTORY — DX: Essential (primary) hypertension: I10

## 2011-11-17 LAB — URINALYSIS, ROUTINE W REFLEX MICROSCOPIC
Bilirubin Urine: NEGATIVE
Glucose, UA: NEGATIVE mg/dL
Ketones, ur: NEGATIVE mg/dL
pH: 6 (ref 5.0–8.0)

## 2011-11-17 LAB — CBC
HCT: 34.2 % — ABNORMAL LOW (ref 36.0–46.0)
Hemoglobin: 11.6 g/dL — ABNORMAL LOW (ref 12.0–15.0)
MCH: 28.3 pg (ref 26.0–34.0)
MCHC: 33.9 g/dL (ref 30.0–36.0)
MCHC: 32.8 g/dL (ref 30.0–36.0)
MCV: 86.2 fL (ref 78.0–100.0)
Platelets: 217 10*3/uL (ref 150–400)
RBC: 3.99 MIL/uL (ref 3.87–5.11)
RDW: 14.7 % (ref 11.5–15.5)

## 2011-11-17 LAB — POCT I-STAT, CHEM 8
Calcium, Ion: 1.35 mmol/L — ABNORMAL HIGH (ref 1.12–1.32)
Creatinine, Ser: 1.2 mg/dL — ABNORMAL HIGH (ref 0.50–1.10)
Hemoglobin: 12.2 g/dL (ref 12.0–15.0)
Sodium: 141 mEq/L (ref 135–145)
TCO2: 25 mmol/L (ref 0–100)

## 2011-11-17 LAB — GLUCOSE, CAPILLARY: Glucose-Capillary: 171 mg/dL — ABNORMAL HIGH (ref 70–99)

## 2011-11-17 LAB — DIFFERENTIAL
Basophils Relative: 0 % (ref 0–1)
Lymphocytes Relative: 14 % (ref 12–46)
Monocytes Absolute: 0.4 10*3/uL (ref 0.1–1.0)
Monocytes Relative: 4 % (ref 3–12)
Neutro Abs: 8.2 10*3/uL — ABNORMAL HIGH (ref 1.7–7.7)

## 2011-11-17 LAB — HEMOGLOBIN A1C: Hgb A1c MFr Bld: 7.6 % — ABNORMAL HIGH (ref <5.7)

## 2011-11-17 MED ORDER — CALCIUM CARBONATE 1250 (500 CA) MG PO TABS
1.0000 | ORAL_TABLET | Freq: Every evening | ORAL | Status: DC
  Administered 2011-11-17 – 2011-11-24 (×7): 500 mg via ORAL
  Filled 2011-11-17 (×9): qty 1

## 2011-11-17 MED ORDER — ACETAMINOPHEN 325 MG PO TABS
ORAL_TABLET | ORAL | Status: AC
  Administered 2011-11-17: 650 mg via ORAL
  Filled 2011-11-17: qty 2

## 2011-11-17 MED ORDER — METFORMIN HCL 500 MG PO TABS
1000.0000 mg | ORAL_TABLET | Freq: Two times a day (BID) | ORAL | Status: DC
  Administered 2011-11-17 – 2011-11-18 (×3): 1000 mg via ORAL
  Filled 2011-11-17 (×6): qty 2

## 2011-11-17 MED ORDER — FENTANYL CITRATE 0.05 MG/ML IJ SOLN
50.0000 ug | INTRAMUSCULAR | Status: DC | PRN
  Administered 2011-11-17 (×2): 50 ug via INTRAVENOUS
  Filled 2011-11-17 (×2): qty 2

## 2011-11-17 MED ORDER — MORPHINE SULFATE 2 MG/ML IJ SOLN
1.0000 mg | INTRAMUSCULAR | Status: DC | PRN
Start: 2011-11-17 — End: 2011-11-25

## 2011-11-17 MED ORDER — INSULIN ASPART 100 UNIT/ML ~~LOC~~ SOLN
0.0000 [IU] | Freq: Three times a day (TID) | SUBCUTANEOUS | Status: DC
  Administered 2011-11-17: 8 [IU] via SUBCUTANEOUS
  Administered 2011-11-17: 3 [IU] via SUBCUTANEOUS
  Administered 2011-11-18: 13:00:00 via SUBCUTANEOUS
  Administered 2011-11-18: 3 [IU] via SUBCUTANEOUS
  Administered 2011-11-18: 2 [IU] via SUBCUTANEOUS
  Administered 2011-11-20 – 2011-11-22 (×2): 3 [IU] via SUBCUTANEOUS
  Administered 2011-11-22: 2 [IU] via SUBCUTANEOUS
  Administered 2011-11-22 – 2011-11-23 (×2): 5 [IU] via SUBCUTANEOUS
  Administered 2011-11-23: 3 [IU] via SUBCUTANEOUS
  Administered 2011-11-23: 8 [IU] via SUBCUTANEOUS
  Administered 2011-11-24: 3 [IU] via SUBCUTANEOUS
  Administered 2011-11-24: 5 [IU] via SUBCUTANEOUS
  Administered 2011-11-24: 3 [IU] via SUBCUTANEOUS
  Administered 2011-11-25: 2 [IU] via SUBCUTANEOUS
  Administered 2011-11-25: 3 [IU] via SUBCUTANEOUS

## 2011-11-17 MED ORDER — CILOSTAZOL 100 MG PO TABS
100.0000 mg | ORAL_TABLET | Freq: Every day | ORAL | Status: DC
  Administered 2011-11-17 – 2011-11-25 (×9): 100 mg via ORAL
  Filled 2011-11-17 (×10): qty 1

## 2011-11-17 MED ORDER — ENOXAPARIN SODIUM 40 MG/0.4ML ~~LOC~~ SOLN
40.0000 mg | SUBCUTANEOUS | Status: DC
  Administered 2011-11-17: 40 mg via SUBCUTANEOUS
  Filled 2011-11-17 (×2): qty 0.4

## 2011-11-17 MED ORDER — PREDNISOLONE ACETATE 1 % OP SUSP
1.0000 [drp] | Freq: Two times a day (BID) | OPHTHALMIC | Status: DC
  Administered 2011-11-17 – 2011-11-25 (×16): 1 [drp] via OPHTHALMIC
  Filled 2011-11-17 (×2): qty 1

## 2011-11-17 MED ORDER — GLIMEPIRIDE 2 MG PO TABS
2.0000 mg | ORAL_TABLET | Freq: Two times a day (BID) | ORAL | Status: DC
  Administered 2011-11-17 – 2011-11-20 (×5): 2 mg via ORAL
  Filled 2011-11-17 (×8): qty 1

## 2011-11-17 MED ORDER — ACETAMINOPHEN 325 MG PO TABS
650.0000 mg | ORAL_TABLET | Freq: Four times a day (QID) | ORAL | Status: DC | PRN
  Administered 2011-11-19 – 2011-11-21 (×2): 650 mg via ORAL
  Filled 2011-11-17 (×2): qty 2

## 2011-11-17 MED ORDER — VANCOMYCIN HCL 1000 MG IV SOLR
1250.0000 mg | Freq: Two times a day (BID) | INTRAVENOUS | Status: DC
  Administered 2011-11-18 – 2011-11-19 (×3): 1250 mg via INTRAVENOUS
  Filled 2011-11-17 (×4): qty 1250

## 2011-11-17 MED ORDER — CALCIUM CARBONATE 1250 (500 CA) MG PO TABS
2.0000 | ORAL_TABLET | Freq: Every day | ORAL | Status: DC
  Administered 2011-11-18 – 2011-11-25 (×8): 1000 mg via ORAL
  Filled 2011-11-17 (×8): qty 2

## 2011-11-17 MED ORDER — AMLODIPINE BESYLATE-VALSARTAN 10-320 MG PO TABS: 1.0000 | ORAL_TABLET | Freq: Every day | ORAL | Status: DC

## 2011-11-17 MED ORDER — ACETAMINOPHEN 650 MG RE SUPP: 650.0000 mg | Freq: Four times a day (QID) | RECTAL | Status: DC | PRN

## 2011-11-17 MED ORDER — VANCOMYCIN HCL 1000 MG IV SOLR
1250.0000 mg | Freq: Once | INTRAVENOUS | Status: AC
  Administered 2011-11-17: 1250 mg via INTRAVENOUS
  Filled 2011-11-17: qty 1250

## 2011-11-17 MED ORDER — IRBESARTAN 300 MG PO TABS
300.0000 mg | ORAL_TABLET | Freq: Every day | ORAL | Status: DC
  Administered 2011-11-17 – 2011-11-18 (×2): 300 mg via ORAL
  Filled 2011-11-17 (×3): qty 1

## 2011-11-17 MED ORDER — LINAGLIPTIN 5 MG PO TABS
5.0000 mg | ORAL_TABLET | Freq: Every day | ORAL | Status: DC
  Administered 2011-11-17 – 2011-11-20 (×4): 5 mg via ORAL
  Filled 2011-11-17 (×4): qty 1

## 2011-11-17 MED ORDER — VITAMIN D3 25 MCG (1000 UNIT) PO TABS
1000.0000 [IU] | ORAL_TABLET | Freq: Every day | ORAL | Status: DC
  Administered 2011-11-17 – 2011-11-25 (×9): 1000 [IU] via ORAL
  Filled 2011-11-17 (×9): qty 1

## 2011-11-17 MED ORDER — DOCUSATE SODIUM 100 MG PO CAPS
100.0000 mg | ORAL_CAPSULE | Freq: Two times a day (BID) | ORAL | Status: DC | PRN
Start: 2011-11-17 — End: 2011-11-25
  Filled 2011-11-17: qty 1

## 2011-11-17 MED ORDER — INSULIN ASPART 100 UNIT/ML ~~LOC~~ SOLN
0.0000 [IU] | Freq: Every day | SUBCUTANEOUS | Status: DC
  Administered 2011-11-18: 3 [IU] via SUBCUTANEOUS
  Administered 2011-11-21: 2 [IU] via SUBCUTANEOUS
  Administered 2011-11-23: 3 [IU] via SUBCUTANEOUS
  Administered 2011-11-24: 2 [IU] via SUBCUTANEOUS

## 2011-11-17 MED ORDER — AMLODIPINE BESYLATE 10 MG PO TABS
10.0000 mg | ORAL_TABLET | Freq: Every day | ORAL | Status: DC
  Administered 2011-11-17 – 2011-11-18 (×2): 10 mg via ORAL
  Filled 2011-11-17 (×3): qty 1

## 2011-11-17 MED ORDER — SODIUM CHLORIDE 0.45 % IV SOLN
INTRAVENOUS | Status: DC
  Administered 2011-11-17: 11:00:00 via INTRAVENOUS

## 2011-11-17 MED ORDER — CALCIUM 1500 MG PO TABS: 1500.0000 mg | ORAL_TABLET | Freq: Two times a day (BID) | ORAL | Status: DC

## 2011-11-17 MED ORDER — ACETAMINOPHEN 325 MG PO TABS
650.0000 mg | ORAL_TABLET | Freq: Once | ORAL | Status: AC
  Administered 2011-11-17: 650 mg via ORAL

## 2011-11-17 MED ORDER — ASPIRIN EC 81 MG PO TBEC
81.0000 mg | DELAYED_RELEASE_TABLET | Freq: Every day | ORAL | Status: DC
  Administered 2011-11-17 – 2011-11-25 (×9): 81 mg via ORAL
  Filled 2011-11-17 (×9): qty 1

## 2011-11-17 MED ORDER — VANCOMYCIN HCL IN DEXTROSE 1-5 GM/200ML-% IV SOLN
1000.0000 mg | Freq: Once | INTRAVENOUS | Status: AC
  Administered 2011-11-17: 1000 mg via INTRAVENOUS
  Filled 2011-11-17: qty 200

## 2011-11-17 MED ORDER — GABAPENTIN 300 MG PO CAPS
300.0000 mg | ORAL_CAPSULE | Freq: Every day | ORAL | Status: DC
  Administered 2011-11-17 – 2011-11-24 (×8): 300 mg via ORAL
  Filled 2011-11-17 (×9): qty 1

## 2011-11-17 MED ORDER — SODIUM CHLORIDE 0.9 % IV SOLN
INTRAVENOUS | Status: AC
  Administered 2011-11-17 – 2011-11-20 (×5): via INTRAVENOUS

## 2011-11-17 MED ORDER — INSULIN GLARGINE 100 UNIT/ML ~~LOC~~ SOLN
40.0000 [IU] | Freq: Every day | SUBCUTANEOUS | Status: DC
  Administered 2011-11-17 – 2011-11-20 (×4): 40 [IU] via SUBCUTANEOUS

## 2011-11-17 MED ORDER — HYDROCODONE-ACETAMINOPHEN 5-325 MG PO TABS
1.0000 | ORAL_TABLET | ORAL | Status: DC | PRN
  Administered 2011-11-17 – 2011-11-18 (×4): 2 via ORAL
  Filled 2011-11-17 (×4): qty 2

## 2011-11-17 MED ORDER — PANTOPRAZOLE SODIUM 40 MG PO TBEC
40.0000 mg | DELAYED_RELEASE_TABLET | Freq: Every day | ORAL | Status: DC
  Administered 2011-11-17 – 2011-11-25 (×9): 40 mg via ORAL
  Filled 2011-11-17 (×8): qty 1

## 2011-11-17 MED ORDER — TORSEMIDE 10 MG PO TABS
10.0000 mg | ORAL_TABLET | Freq: Every day | ORAL | Status: DC
  Administered 2011-11-17 – 2011-11-18 (×2): 10 mg via ORAL
  Filled 2011-11-17 (×3): qty 1

## 2011-11-17 NOTE — ED Notes (Signed)
Pt comes to the ED complaining of pain in upper right inner thigh.  Pt states she has been coughing and having difficulty breathing as well.  Pain has persisted for the past three days.  The area on the inner thigh is tender, red, hard, and warm to touch.

## 2011-11-17 NOTE — H&P (Signed)
PCP:    Dr. Guerry Bruin  Chief Complaint:  Right groin pain-progressive over 3 days  HPI: This is a 66 year old morbidly obese blind African American female with type 2 diabetes, poorly controlled historically complicated by reflux, legal blindness, peripheral vascular disease, hypertension and arthritis who resides at home with 2 children, and is able to ambulate around the house with known pathways and fixtures. She presents with a three-day history of progressive pain in the right groin area, no history of injury, trauma, or history of similar situations. Pain with range of motion in the groin area but mostly superficial, associated with some chills once here in the emergency room. Patient denies any chest pain, shortness of breath, see review systems below.   Review of Systems:  Patient denies any decrease in appetite until the recent one-2 days, denies fevers, chills until approaching the emergency room, denies chest pain, shortness of breath, new focal neurological deficits, does ambulate around her house with assistance and a laying furniture pathways in her house, denies any palpitations, nausea, vomiting, change in bowel habits, no blood in stool or urine. Denies any lower extremity edema. Denies night sweats. Denies any recurrent issues with hypoglycemia. Capillary blood glucose measurements all less than 200 within the last 24-36 hours. Pain chronic in the shoulder however patient has received steroid injections from orthopedics with fairly good results. Schedule does have office visit with primary care physician later this week. Patient is not taking labetalol or Catapres patches.  Past medical history Type 2 diabetes being diagnosed in 1985 complicated by retinopathy Hypertension 2002 Peripheral vascular disease 2002 Chronic back pain Legally blind Right shoulder pain followed by Dr. 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  Medications: Prior to Admission medications   Medication Sig Start Date End Date Taking? Authorizing Provider  amLODipine-valsartan (EXFORGE) 10-320 MG per tablet Take 1 tablet by mouth daily.   Yes Historical Provider, MD  aspirin EC 81 MG tablet Take 81 mg by mouth daily.   Yes Historical Provider, MD  Calcium 1500 MG tablet Take 1,500-3,000 mg by mouth 2 (two) times daily. Takes 2 in the morning and 1 at night   Yes Historical Provider, MD  cholecalciferol (VITAMIN D) 1000 UNITS tablet Take 1,000 Units by mouth daily.   Yes Historical Provider, MD  cilostazol (PLETAL) 100 MG tablet Take 100 mg by mouth daily.   Yes Historical Provider, MD  Dexlansoprazole (DEXILANT) 30 MG capsule Take 30 mg by mouth daily.   Yes Historical Provider, MD  docusate sodium (COLACE) 100 MG capsule Take 100 mg by mouth 2 (two) times daily as needed. Stool softener   Yes Historical Provider, MD  gabapentin (NEURONTIN) 300 MG capsule Take 300 mg by mouth at bedtime.   Yes Historical Provider, MD  glimepiride (AMARYL) 4 MG tablet Take 2 mg by mouth 2 (two) times daily before a meal.   Yes Historical Provider, MD  insulin glargine (LANTUS) 100 UNIT/ML injection Inject 40 Units into the skin at bedtime.   Yes Historical Provider, MD  metFORMIN (GLUCOPHAGE) 1000 MG tablet Take 1,000 mg by mouth daily with breakfast.   Yes Historical Provider, MD  PREDNISOLONE ACETATE PO Take 5 mLs  by mouth daily. 1%   Yes Historical Provider, MD  saxagliptin HCl (ONGLYZA) 5 MG TABS tablet Take 5 mg by mouth daily.   Yes Historical Provider, MD  torsemide (DEMADEX) 20 MG tablet Take 10 mg by mouth daily.   Yes Historical Provider, MD    Allergies:  No Known Allergies   Physical  Exam: Filed Vitals:   11/17/11 0338 11/17/11 0624 11/17/11 0807  BP: 192/51 175/67   Pulse: 111 100   Temp: 101.1 F (38.4 C) 100.1 F (37.8 C) 99 F (37.2 C)  TempSrc: Oral Oral Oral  Resp: 14 20   SpO2: 91% 100%    Physical exam General appearance morbidly obese no apparent distress lying flat in bed answering all questions appropriately Head normocephalic/atraumatic Blind, right corneal opacity with dilated left pupil Neck is supple, no cervical lymphadenopathy Cardiovascular reveals a regular rate and rhythm Lungs clear to auscultation bilaterally Abdomen is soft morbidly obese, nontender generally with bowel sounds present Evaluation of the right groin reveals significant pannus, induration and erythema above the right groin area and below the right groin area but no evidence of skin excoriation, skin breakdown overtly however there is significant erythema and moderate tenderness. Per emergency room physician, bedside ultrasound did not reveal any pus pockets Neurologic exam, patient move all 4 extremities, tone intact, no resting tremors, alert and oriented x3, no facial asymmetries   Labs on Admission:   [facility] 11/17/11 0509  NA 141  K 4.1  CL 108  CO2 --  GLUCOSE 187*  BUN 16  CREATININE 1.20*  CALCIUM --  MG --  PHOS --   No results found for this basename: AST:2,ALT:2,ALKPHOS:2,BILITOT:2,PROT:2,ALBUMIN:2 in the last 72 hours No results found for this basename: LIPASE:2,AMYLASE:2 in the last 72 hours  Basename 11/17/11 0509 11/17/11 0454  WBC -- 10.1  NEUTROABS -- 8.2*  HGB 12.2 11.6*  HCT 36.0 34.2*  MCV -- 86.1  PLT -- 222   No results found for this basename: CKTOTAL:3,CKMB:3,CKMBINDEX:3,TROPONINI:3 in the last 72 hours No results found for this basename: TSH,T4TOTAL,FREET3,T3FREE,THYROIDAB in the last 72 hours No results found for this basename: VITAMINB12:2,FOLATE:2,FERRITIN:2,TIBC:2,IRON:2,RETICCTPCT:2 in the last 72 hours  Radiological Exams on  Admission: Dg Chest Port 1 View  11/17/2011  *RADIOLOGY REPORT*  Clinical Data: Fever  PORTABLE CHEST - 1 VIEW  Comparison: 03/08/2007  Findings: Lungs are essentially clear. No pleural effusion or pneumothorax.  Stable mild cardiomegaly.  IMPRESSION: No evidence of acute cardiopulmonary disease.  Stable mild cardiomegaly.  Original Report Authenticated By: Charline Bills, M.D.   No orders found for this or any previous visit.  Assessment/Plan Principal Problem:  *Cellulitis of left groin-associated with fever in excess of 101F, urinalysis unremarkable, chest x-ray unremarkable and physical exam unremarkable for any evidence of other infection, associated with progressive pain in the right groin, we'll presume that this is related to the cellulitis, apparently no evidence of pus pockets based on bedside ultrasound per emergency room physician, will treat and continue empiric treatment with vancomycin, blood cultures obtained in the emergency room will followup and hopefully transition to oral medications in the next 24-48 hours for hopeful transfer and disposition home. Active Problems:  Legal blindness, as defined in [place]-supported by daughter and son, apparently coping extremely well at home in the setting, has assistive devices including a speaking meter for CBG checks  Type II or unspecified type diabetes mellitus with unspecified complication, uncontrolled-no evidence of hypoglycemia, caloric intake may be decreased in the setting of infection, will  monitor closely, will also check hemoglobin A1c however last hemoglobin A1c in January was 8.7%.  Essential hypertension, benign-uncontrolled, will resume home medications there is a question of whether or not the patient should be on labetalol however the patient has not taken this for at least a few years since placement in a skilled nursing facility.  Esophageal reflux-will continue proton pump inhibitor, no epigastric discomfort and no significant  issues with anemia at this time.   Maveric Debono R 11/17/2011, 9:21 AM

## 2011-11-17 NOTE — ED Notes (Signed)
Boil rt upper thigh since Thursday.  No previous history

## 2011-11-17 NOTE — ED Provider Notes (Signed)
History     CSN: [id number]  Arrival date & time 11/17/11  8469   First MD Initiated Contact with Patient 11/17/11 [phone]      Chief Complaint  Patient presents with  . Boil     (Consider location/radiation/quality/duration/timing/severity/associated sxs/prior treatment) HPI Is a 66 year old black female with a history of diabetes. She is here with a three-day history of pain in her right groin fold. She thought at first that this was a simple boil but it has increased in size. There is moderate to severe pain associated with it, worse with palpation or movement. She feels the pain now moving into her lower abdomen on that side. There is associated erythema and she was noted to have a fever and tachycardia in triage. She states she has chronic shortness of breath and occasional cough but there's been no acute changes. She denies acute urinary symptoms. She is blind. She has known poorly healing fractures of the right upper extremity.  Past Medical History  Diagnosis Date  . Hypertension   . Diabetes mellitus   . Blind     History reviewed. No pertinent past surgical history.  No family history on file.  History  Substance Use Topics  . Smoking status: Never Smoker   . Smokeless tobacco: Not on file  . Alcohol Use: No    OB History    Grav Para Term Preterm Abortions TAB SAB Ect Mult Living                  Review of Systems  All other systems reviewed and are negative.    Allergies  Review of patient's allergies indicates no known allergies.  Home Medications   Current Outpatient Rx  Name Route Sig Dispense Refill  . AMLODIPINE BESYLATE-VALSARTAN 10-320 MG PO TABS Oral Take 1 tablet by mouth daily.    . ASPIRIN EC 81 MG PO TBEC Oral Take 81 mg by mouth daily.    Marland Kitchen CALCIUM 1500 MG PO TABS Oral Take 1,500-3,000 mg by mouth 2 (two) times daily. Takes 2 in the morning and 1 at night    . VITAMIN D 1000 UNITS PO TABS Oral Take 1,000 Units by mouth daily.    Marland Kitchen  CILOSTAZOL 100 MG PO TABS Oral Take 100 mg by mouth daily.    . DEXLANSOPRAZOLE 30 MG PO CPDR Oral Take 30 mg by mouth daily.    Marland Kitchen DOCUSATE SODIUM 100 MG PO CAPS Oral Take 100 mg by mouth 2 (two) times daily as needed. Stool softener    . GABAPENTIN 300 MG PO CAPS Oral Take 300 mg by mouth at bedtime.    Marland Kitchen GLIMEPIRIDE 4 MG PO TABS Oral Take 2 mg by mouth 2 (two) times daily before a meal.    . INSULIN GLARGINE 100 UNIT/ML [place] SOLN Subcutaneous Inject 40 Units into the skin at bedtime.    Marland Kitchen METFORMIN HCL 1000 MG PO TABS Oral Take 1,000 mg by mouth daily with breakfast.    . PREDNISOLONE ACETATE PO Oral Take 5 mLs by mouth daily. 1%    . SAXAGLIPTIN HCL 5 MG PO TABS Oral Take 5 mg by mouth daily.    . TORSEMIDE 20 MG PO TABS Oral Take 10 mg by mouth daily.      BP 175/67  Pulse 100  Temp(Src) 100.1 F (37.8 C) (Oral)  Resp 20  SpO2 100%  Physical Exam General: Well-developed, obese female in no acute distress; appearance consistent with age of record  HENT: normocephalic, atraumatic Eyes: Blind; right corneal opacity; fixed, dilated left pupil Neck: supple Heart: regular rate and rhythm Lungs: clear to auscultation bilaterally Abdomen: soft; obese; tenderness underneath right side of tenderness above the groin fold Extremities: Chronic deformities of right upper extremity; pulses normal; trace edema of lower legs Neurologic: Awake, alert and oriented; motor function intact in all extremities; no facial droop Skin: Tender, indurated, erythematous region just inferior to right groin fold; no pus pockets seen on bedside ultrasound; there is surrounding erythema Psychiatric: Normal mood and affect    ED Course  Procedures (including critical care time)     MDM   Nursing notes and vitals signs, including pulse oximetry, reviewed.  Summary of this visit's results, reviewed by myself:  Labs:  Results for orders placed during the hospital encounter of 11/17/11  CBC      Component  Value Range   WBC 10.1  4.0 - 10.5 (K/uL)   RBC 3.97  3.87 - 5.11 (MIL/uL)   Hemoglobin 11.6 (*) 12.0 - 15.0 (g/dL)   HCT 16.1 (*) 09.6 - 46.0 (%)   MCV 86.1  78.0 - 100.0 (fL)   MCH 29.2  26.0 - 34.0 (pg)   MCHC 33.9  30.0 - 36.0 (g/dL)   RDW 04.5  40.9 - 81.1 (%)   Platelets 222  150 - 400 (K/uL)  DIFFERENTIAL      Component Value Range   Neutrophils Relative 81 (*) 43 - 77 (%)   Neutro Abs 8.2 (*) 1.7 - 7.7 (K/uL)   Lymphocytes Relative 14  12 - 46 (%)   Lymphs Abs 1.4  0.7 - 4.0 (K/uL)   Monocytes Relative 4  3 - 12 (%)   Monocytes Absolute 0.4  0.1 - 1.0 (K/uL)   Eosinophils Relative 1  0 - 5 (%)   Eosinophils Absolute 0.1  0.0 - 0.7 (K/uL)   Basophils Relative 0  0 - 1 (%)   Basophils Absolute 0.0  0.0 - 0.1 (K/uL)  URINALYSIS, ROUTINE W REFLEX MICROSCOPIC      Component Value Range   Color, Urine YELLOW  YELLOW    APPearance CLEAR  CLEAR    Specific Gravity, Urine 1.021  1.005 - 1.030    pH 6.0  5.0 - 8.0    Glucose, UA NEGATIVE  NEGATIVE (mg/dL)   Hgb urine dipstick NEGATIVE  NEGATIVE    Bilirubin Urine NEGATIVE  NEGATIVE    Ketones, ur NEGATIVE  NEGATIVE (mg/dL)   Protein, ur NEGATIVE  NEGATIVE (mg/dL)   Urobilinogen, UA 1.0  0.0 - 1.0 (mg/dL)   Nitrite NEGATIVE  NEGATIVE    Leukocytes, UA NEGATIVE  NEGATIVE   POCT I-STAT, CHEM 8      Component Value Range   Sodium 141  135 - 145 (mEq/L)   Potassium 4.1  3.5 - 5.1 (mEq/L)   Chloride 108  96 - 112 (mEq/L)   BUN 16  6 - 23 (mg/dL)   Creatinine, Ser 9.14 (*) 0.50 - 1.10 (mg/dL)   Glucose, Bld 782 (*) 70 - 99 (mg/dL)   Calcium, Ion 9.56 (*) 1.12 - 1.32 (mmol/L)   TCO2 25  0 - 100 (mmol/L)   Hemoglobin 12.2  12.0 - 15.0 (g/dL)   HCT 21.3  08.6 - 57.8 (%)    Imaging Studies: Dg Chest Port 1 View  11/17/2011  *RADIOLOGY REPORT*  Clinical Data: Fever  PORTABLE CHEST - 1 VIEW  Comparison: 03/08/2007  Findings: Lungs are essentially clear. No pleural  effusion or pneumothorax.  Stable mild cardiomegaly.  IMPRESSION:  No evidence of acute cardiopulmonary disease.  Stable mild cardiomegaly.  Original Report Authenticated By: Charline Bills, M.D.   6:50 AM Patient given vancomycin 1 g IV. Suspect early abscess without frank pus formation at this time.           Hanley Seamen, MD 11/17/11 [phone]

## 2011-11-17 NOTE — Progress Notes (Signed)
ANTIBIOTIC CONSULT NOTE - INITIAL  Pharmacy Consult for Vancomycin Indication: Groin Infection  No Known Allergies  Patient Measurements:  Vital Signs: Temp: 100.1 F (37.8 C) (04/07 1045) Temp src: Oral (04/07 0807) BP: 141/84 mmHg (04/07 1045) Pulse Rate: 110  (04/07 1045) Intake/Output from previous day: 04/06 0701 - 04/07 0700 In: -  Out: 250 [Urine:250] Intake/Output from this shift:    Labs:  Basename 11/17/11 0509 11/17/11 0454  WBC -- 10.1  HGB 12.2 11.6*  PLT -- 222  LABCREA -- --  CREATININE 1.20* --   CrCl is unknown because there is no height on file for the current visit. No results found for this basename: VANCOTROUGH:2,VANCOPEAK:2,VANCORANDOM:2,GENTTROUGH:2,GENTPEAK:2,GENTRANDOM:2,TOBRATROUGH:2,TOBRAPEAK:2,TOBRARND:2,AMIKACINPEAK:2,AMIKACINTROU:2,AMIKACIN:2, in the last 72 hours   Microbiology: No results found for this or any previous visit (from the past 720 hour(s)).  Medical History: Past Medical History  Diagnosis Date  . Hypertension   . Diabetes mellitus   . Blind     Medications:  Prescriptions prior to admission  Medication Sig Dispense Refill  . amLODipine-valsartan (EXFORGE) 10-320 MG per tablet Take 1 tablet by mouth daily.      Marland Kitchen aspirin EC 81 MG tablet Take 81 mg by mouth daily.      . Calcium 1500 MG tablet Take 1,500-3,000 mg by mouth 2 (two) times daily. Takes 2 in the morning and 1 at night      . cholecalciferol (VITAMIN D) 1000 UNITS tablet Take 1,000 Units by mouth daily.      . cilostazol (PLETAL) 100 MG tablet Take 100 mg by mouth daily.      Marland Kitchen Dexlansoprazole (DEXILANT) 30 MG capsule Take 30 mg by mouth daily.      Marland Kitchen docusate sodium (COLACE) 100 MG capsule Take 100 mg by mouth 2 (two) times daily as needed. Stool softener      . gabapentin (NEURONTIN) 300 MG capsule Take 300 mg by mouth at bedtime.      Marland Kitchen glimepiride (AMARYL) 4 MG tablet Take 2 mg by mouth 2 (two) times daily before a meal.      . insulin glargine  (LANTUS) 100 UNIT/ML injection Inject 40 Units into the skin at bedtime.      . metFORMIN (GLUCOPHAGE) 1000 MG tablet Take 1,000 mg by mouth daily with breakfast.      . PREDNISOLONE ACETATE PO Take 5 mLs by mouth daily. 1%      . saxagliptin HCl (ONGLYZA) 5 MG TABS tablet Take 5 mg by mouth daily.      Marland Kitchen torsemide (DEMADEX) 20 MG tablet Take 10 mg by mouth daily.       Admit Complaint: 66 y.o.  female  admitted 11/17/2011 with groin pain.  Pharmacy consulted to start vancomycin for groin cellulitis  Weight: 136.8kg, Height 46ft 5in   Assessment: Anticoagulation: DVT Prohylaxis: Enoxaparin Infectious Disease: Groin cellulitis, febrile, WBC up slightly, starting vancomycin Cardiovascular: HTN, PVD: ASA, amlodipine, valsartan, torsemide, pletal, BP above goal, sinus tachycardia Endocrinology: DM: glimeperide, SSI, lantus, metformin, saxagliptan, on home regimen, glucose < 200 Gastrointestinal / Nutrition: GERD: protonix Neurology: Neuropathy: gabapentin,  PTA Medication Issues: Home Meds Not Ordered: Prednisolone,  Best Practices: DVT Prophylaxis:  Enoxaparin 40 q24h -> change to 60 mg sq q24h as BMI over 30   Goal of Therapy:  Vancomycin trough 10-15 mcg/ml  Plan:  1. Vancomycin 1250g IV x 1 load then 1250 mg IV q12h. 2. Follow up SCr, UOP, cultures, clinical course and adjust as clinically indicated. 3. Follow up with  patient regarding prenisolone eyedrops and order.   [name] [name] 11/17/2011,11:03 AM

## 2011-11-18 LAB — COMPREHENSIVE METABOLIC PANEL
Albumin: 2.7 g/dL — ABNORMAL LOW (ref 3.5–5.2)
Alkaline Phosphatase: 68 U/L (ref 39–117)
BUN: 20 mg/dL (ref 6–23)
Creatinine, Ser: 1.05 mg/dL (ref 0.50–1.10)
Potassium: 4.3 mEq/L (ref 3.5–5.1)
Total Protein: 6.5 g/dL (ref 6.0–8.3)

## 2011-11-18 LAB — URINE CULTURE
Colony Count: NO GROWTH
Culture  Setup Time: 201304071138

## 2011-11-18 LAB — GLUCOSE, CAPILLARY
Glucose-Capillary: 128 mg/dL — ABNORMAL HIGH (ref 70–99)
Glucose-Capillary: 191 mg/dL — ABNORMAL HIGH (ref 70–99)

## 2011-11-18 LAB — CBC
HCT: 32.5 % — ABNORMAL LOW (ref 36.0–46.0)
MCV: 87.6 fL (ref 78.0–100.0)
RBC: 3.71 MIL/uL — ABNORMAL LOW (ref 3.87–5.11)
WBC: 10.4 10*3/uL (ref 4.0–10.5)

## 2011-11-18 MED ORDER — OXYCODONE HCL 10 MG PO TB12
10.0000 mg | ORAL_TABLET | Freq: Two times a day (BID) | ORAL | Status: DC
  Administered 2011-11-18 – 2011-11-23 (×10): 10 mg via ORAL
  Filled 2011-11-18 (×10): qty 1

## 2011-11-18 MED ORDER — AMOXICILLIN-POT CLAVULANATE 875-125 MG PO TABS
1.0000 | ORAL_TABLET | Freq: Two times a day (BID) | ORAL | Status: DC
  Administered 2011-11-18 (×2): 1 via ORAL
  Filled 2011-11-18 (×4): qty 1

## 2011-11-18 MED ORDER — ENOXAPARIN SODIUM 80 MG/0.8ML ~~LOC~~ SOLN
70.0000 mg | SUBCUTANEOUS | Status: DC
  Administered 2011-11-18 – 2011-11-19 (×2): 70 mg via SUBCUTANEOUS
  Filled 2011-11-18 (×3): qty 0.8

## 2011-11-18 NOTE — Progress Notes (Signed)
Subjective: Still pain in her right groin.  Tender, needing morphine every couple of house.  Wound care consult put in for this AM.  No issues with antibiotics such as flushing or rash.  Feels ok otherwise.  Objective: Vital signs in last 24 hours: Temp:  [98.4 F (36.9 C)-102.7 F (39.3 C)] 99.5 F (37.5 C) (04/08 0522) Pulse Rate:  [99-114] 100  (04/08 0522) Resp:  [20] 20  (04/08 0522) BP: (95-148)/(58-84) 95/65 mmHg (04/08 0522) SpO2:  [94 %-98 %] 94 % (04/08 0522) Weight:  [136.533 kg (301 lb)] 136.533 kg (301 lb) (04/07 1040) Weight change:  Last BM Date: 11/16/11  Intake/Output from previous day: 04/07 0701 - 04/08 0700 In: 240 [P.O.:240] Out: 725 [Urine:725] Intake/Output this shift:    General appearance: alert, cooperative and appears stated age Neck: no adenopathy, no carotid bruit, no JVD, supple, symmetrical, trachea midline and thyroid not enlarged, symmetric, no tenderness/mass/nodules Resp: clear to auscultation bilaterally Cardio: regular rate and rhythm, S1, S2 normal, no murmur, click, rub or gallop GI: soft, non-tender; bowel sounds normal; no masses,  no organomegaly Extremities: extremities normal, atraumatic, no cyanosis, trace edema Skin: Skin color, texture, turgor normal. No rashes or lesions Lymph nodes: Inguinal adenopathy: R groin nodes positive  Lab Results:  Basename 11/18/11 0545 11/17/11 1129  WBC 10.4 11.5*  HGB 10.6* 11.3*  HCT 32.5* 34.4*  PLT 199 217   BMET  Basename 11/18/11 0545 11/17/11 1129 11/17/11 0509  NA 135 -- 141  K 4.3 -- 4.1  CL 101 -- 108  CO2 25 -- --  GLUCOSE 163* -- 187*  BUN 20 -- 16  CREATININE 1.05 0.97 --  CALCIUM 10.1 -- --    Studies/Results: Dg Chest Port 1 View  11/17/2011  *RADIOLOGY REPORT*  Clinical Data: Fever  PORTABLE CHEST - 1 VIEW  Comparison: 03/08/2007  Findings: Lungs are essentially clear. No pleural effusion or pneumothorax.  Stable mild cardiomegaly.  IMPRESSION: No evidence of acute  cardiopulmonary disease.  Stable mild cardiomegaly.  Original Report Authenticated By: Charline Bills, M.D.    Medications:  I have reviewed the patient's current medications. Scheduled:   . amLODipine  10 mg Oral Daily   And  . irbesartan  300 mg Oral Daily  . aspirin EC  81 mg Oral Daily  . calcium carbonate  2 tablet Oral Daily   And  . calcium carbonate  1 tablet Oral QPM  . cholecalciferol  1,000 Units Oral Daily  . cilostazol  100 mg Oral Daily  . gabapentin  300 mg Oral QHS  . glimepiride  2 mg Oral BID AC  . insulin aspart  0-15 Units Subcutaneous TID WC  . insulin aspart  0-5 Units Subcutaneous QHS  . insulin glargine  40 Units Subcutaneous QHS  . linagliptin  5 mg Oral Daily  . metFORMIN  1,000 mg Oral BID WC  . oxyCODONE  10 mg Oral Q12H  . pantoprazole  40 mg Oral Q1200  . prednisoLONE acetate  1 drop Both Eyes BID  . torsemide  10 mg Oral Daily  . vancomycin  1,250 mg Intravenous Once  . vancomycin  1,250 mg Intravenous Q12H  . DISCONTD: amLODipine-valsartan  1 tablet Oral Daily  . DISCONTD: Calcium  1,500-3,000 mg Oral BID  . DISCONTD: enoxaparin  40 mg Subcutaneous Q24H   Continuous:   . sodium chloride 125 mL/hr at 11/17/11 0611  . DISCONTD: sodium chloride 50 mL/hr at 11/17/11 1104   ZOX:[id number], acetaminophen, docusate  sodium, fentaNYL, HYDROcodone-acetaminophen, morphine  Assessment/Plan: 1) Groin abscess- boil.  Wound care consult pending, on Vancomycin.  I would prefer that she be on some sort of gram negative coverage considering her diabetes.  Will add PO Augmentin today 2) DM2 controlled, SSI.  Her A1C was 7.6, which is good for her.  We have concerns with hypos given her lack of vision. 3)  HTN  Controlled 4) Hyperlipidemia- Cont meds. 5)  Added OxyContin 10mg  bid standing for pain control.  Has tolerated this in the past when she broke her shoulder.  Hopefully will limit PRN use. 6)  Dispo-  Should return home in 1-2 days.  Has excellent  family support.   LOS: 1 day   Quanda Pavlicek W 11/18/2011, 7:57 AM

## 2011-11-18 NOTE — Progress Notes (Signed)
Lab notified RN of gram negative blood cultures in clusters, notified MD on call no new orders received. [name]

## 2011-11-18 NOTE — Plan of Care (Signed)
Problem: Phase I Progression Outcomes Goal: Voiding-avoid urinary catheter unless indicated Outcome: Not Met (add Reason) Catheter needed to protect wound area from getting in contact with urine.

## 2011-11-18 NOTE — Progress Notes (Signed)
ANTICOAGULATION CONSULT NOTE - Initial Consult  Pharmacy Consult for lovenox Indication: VTE prophylaxis  No Known Allergies  Patient Measurements: Height: 5\' 5"  (165.1 cm) Weight: 301 lb (136.533 kg) IBW/kg (Calculated) : 57   Vital Signs: Temp: 99.5 F (37.5 C) (04/08 0522) Temp src: Oral (04/08 0522) BP: 95/65 mmHg (04/08 0522) Pulse Rate: 100  (04/08 0522)  Labs:  Alvira Philips 11/18/11 0545 11/17/11 1129 11/17/11 0509 11/17/11 0454  HGB 10.6* 11.3* -- --  HCT 32.5* 34.4* 36.0 --  PLT 199 217 -- 222  APTT -- -- -- --  LABPROT -- -- -- --  INR -- -- -- --  HEPARINUNFRC -- -- -- --  CREATININE 1.05 0.97 1.20* --  CKTOTAL -- -- -- --  CKMB -- -- -- --  TROPONINI -- -- -- --   Estimated Creatinine Clearance: 72.9 ml/min (by C-G formula based on Cr of 1.05).  Medical History: Past Medical History  Diagnosis Date  . Hypertension   . Diabetes mellitus   . Blind     Medications:  Prescriptions prior to admission  Medication Sig Dispense Refill  . amLODipine-valsartan (EXFORGE) 10-320 MG per tablet Take 1 tablet by mouth daily.      Marland Kitchen aspirin EC 81 MG tablet Take 81 mg by mouth daily.      . Calcium 1500 MG tablet Take 1,500-3,000 mg by mouth 2 (two) times daily. Takes 2 in the morning and 1 at night      . cholecalciferol (VITAMIN D) 1000 UNITS tablet Take 1,000 Units by mouth daily.      . cilostazol (PLETAL) 100 MG tablet Take 100 mg by mouth daily.      Marland Kitchen Dexlansoprazole (DEXILANT) 30 MG capsule Take 30 mg by mouth daily.      Marland Kitchen docusate sodium (COLACE) 100 MG capsule Take 100 mg by mouth 2 (two) times daily as needed. Stool softener      . gabapentin (NEURONTIN) 300 MG capsule Take 300 mg by mouth at bedtime.      Marland Kitchen glimepiride (AMARYL) 4 MG tablet Take 2 mg by mouth 2 (two) times daily before a meal.      . insulin glargine (LANTUS) 100 UNIT/ML injection Inject 40 Units into the skin at bedtime.      . metFORMIN (GLUCOPHAGE) 1000 MG tablet Take 1,000 mg by mouth  daily with breakfast.      . prednisoLONE acetate (PRED FORTE) 1 % ophthalmic suspension Place 1 drop into both eyes 2 (two) times daily.      . saxagliptin HCl (ONGLYZA) 5 MG TABS tablet Take 5 mg by mouth daily.      Marland Kitchen torsemide (DEMADEX) 20 MG tablet Take 10 mg by mouth daily.        Assessment: 66 yo female here with cellulitis on antibiotics. Patient also noted with significant obesity and need for enoxaparin adjustment (last dose 11/17/11 at about 5:00pm). No adjustments needed for renal function  Plan:  -Will give enoxaparin 70mg  [place] q24hr (0.5mg /kg [place] q24hr) -Daily CBC ordered per MD  Benny Lennert 11/18/2011,10:12 AM

## 2011-11-18 NOTE — Consult Note (Signed)
WOC consult Note Reason for Consult: Requested to assess right groin.  Pt has cellulitis but no open wound.     Wound type: Right groin with generalized erythremia  And edema.  No open wound but mod yellow drainage and skin denuded in bilat groin folds from constant moisture.  Area hard and painful to palpation; no abscess noted for possible drainage on diagnostic exam. Dressing procedure/placement/frequency: Interdry will provide antimicrobial benefits and wick drainage away from skin.  This product will only treat denuded areas and moisture; it will not assist with resolving cellulitis or if abscess develops.  Instructions placed in room.  This product should remain in place for 5 days.  Will not plan to follow further unless re-consulted.  [id number] RN, MSN, [employer]  [phone]

## 2011-11-19 ENCOUNTER — Inpatient Hospital Stay (HOSPITAL_COMMUNITY): Payer: Medicare HMO

## 2011-11-19 DIAGNOSIS — A419 Sepsis, unspecified organism: Principal | ICD-10-CM

## 2011-11-19 DIAGNOSIS — N179 Acute kidney failure, unspecified: Secondary | ICD-10-CM

## 2011-11-19 DIAGNOSIS — L02219 Cutaneous abscess of trunk, unspecified: Secondary | ICD-10-CM

## 2011-11-19 DIAGNOSIS — E1165 Type 2 diabetes mellitus with hyperglycemia: Secondary | ICD-10-CM

## 2011-11-19 DIAGNOSIS — IMO0002 Reserved for concepts with insufficient information to code with codable children: Secondary | ICD-10-CM

## 2011-11-19 LAB — BASIC METABOLIC PANEL
BUN: 37 mg/dL — ABNORMAL HIGH (ref 6–23)
BUN: 39 mg/dL — ABNORMAL HIGH (ref 6–23)
CO2: 24 mEq/L (ref 19–32)
CO2: 20 mEq/L (ref 19–32)
Calcium: 10.1 mg/dL (ref 8.4–10.5)
Chloride: 102 mEq/L (ref 96–112)
Creatinine, Ser: 2.37 mg/dL — ABNORMAL HIGH (ref 0.50–1.10)
Glucose, Bld: 138 mg/dL — ABNORMAL HIGH (ref 70–99)
Potassium: 4.6 mEq/L (ref 3.5–5.1)
Sodium: 132 mEq/L — ABNORMAL LOW (ref 135–145)

## 2011-11-19 LAB — COMPREHENSIVE METABOLIC PANEL
Alkaline Phosphatase: 69 U/L (ref 39–117)
BUN: 35 mg/dL — ABNORMAL HIGH (ref 6–23)
CO2: 24 mEq/L (ref 19–32)
Chloride: 102 mEq/L (ref 96–112)
GFR calc Af Amer: 27 mL/min — ABNORMAL LOW (ref >90)
GFR calc non Af Amer: 24 mL/min — ABNORMAL LOW (ref >90)
Glucose, Bld: 133 mg/dL — ABNORMAL HIGH (ref 70–99)
Potassium: 4.6 mEq/L (ref 3.5–5.1)
Total Bilirubin: 0.2 mg/dL — ABNORMAL LOW (ref 0.3–1.2)

## 2011-11-19 LAB — MRSA PCR SCREENING: MRSA by PCR: NEGATIVE

## 2011-11-19 LAB — CBC
HCT: 30.9 % — ABNORMAL LOW (ref 36.0–46.0)
Hemoglobin: 10.1 g/dL — ABNORMAL LOW (ref 12.0–15.0)
WBC: 11.3 10*3/uL — ABNORMAL HIGH (ref 4.0–10.5)

## 2011-11-19 LAB — GLUCOSE, CAPILLARY
Glucose-Capillary: 111 mg/dL — ABNORMAL HIGH (ref 70–99)
Glucose-Capillary: 82 mg/dL (ref 70–99)
Glucose-Capillary: 153 mg/dL — ABNORMAL HIGH (ref 70–99)
Glucose-Capillary: 178 mg/dL — ABNORMAL HIGH (ref 70–99)

## 2011-11-19 LAB — URINALYSIS, ROUTINE W REFLEX MICROSCOPIC
Glucose, UA: NEGATIVE mg/dL
Specific Gravity, Urine: 1.028 (ref 1.005–1.030)

## 2011-11-19 LAB — URINE MICROSCOPIC-ADD ON

## 2011-11-19 LAB — VANCOMYCIN, RANDOM: Vancomycin Rm: 32.6 ug/mL

## 2011-11-19 MED ORDER — AMLODIPINE BESYLATE 10 MG PO TABS
10.0000 mg | ORAL_TABLET | Freq: Every day | ORAL | Status: DC
  Administered 2011-11-19: 10 mg via ORAL
  Filled 2011-11-19 (×2): qty 1

## 2011-11-19 MED ORDER — DOCUSATE SODIUM 100 MG PO CAPS
100.0000 mg | ORAL_CAPSULE | Freq: Two times a day (BID) | ORAL | Status: DC
  Administered 2011-11-19 – 2011-11-25 (×13): 100 mg via ORAL
  Filled 2011-11-19 (×14): qty 1

## 2011-11-19 MED ORDER — POLYETHYLENE GLYCOL 3350 17 G PO PACK
17.0000 g | PACK | Freq: Every day | ORAL | Status: DC | PRN
  Filled 2011-11-19: qty 1

## 2011-11-19 MED ORDER — PIPERACILLIN-TAZOBACTAM 3.375 G IVPB
3.3750 g | Freq: Three times a day (TID) | INTRAVENOUS | Status: DC
  Administered 2011-11-19 – 2011-11-25 (×17): 3.375 g via INTRAVENOUS
  Filled 2011-11-19 (×23): qty 50

## 2011-11-19 MED ORDER — SODIUM CHLORIDE 0.9 % IV BOLUS (SEPSIS)
1000.0000 mL | Freq: Once | INTRAVENOUS | Status: AC
  Administered 2011-11-19: 1000 mL via INTRAVENOUS

## 2011-11-19 NOTE — Progress Notes (Signed)
ANTIBIOTIC CONSULT NOTE - FOLLOW UP  Pharmacy Consult for Vancomycin & Zosyn Indication: right groin cellulitis/abscess, sepsis coverage  No Known Allergies  Patient Measurements: Height: 5\' 5"  (165.1 cm) Weight: 301 lb (136.533 kg) IBW/kg (Calculated) : 57   Vital Signs: Temp: 100.1 F (37.8 C) (04/09 1350) Temp src: Oral (04/09 1350) BP: 139/74 mmHg (04/09 1350) Pulse Rate: 124  (04/09 1350) Intake/Output from previous day: 04/08 0701 - 04/09 0700 In: -  Out: 900 [Urine:900] Intake/Output from this shift:    Labs:  Basename 11/19/11 1119 11/19/11 0530 11/18/11 0545 11/17/11 1129  WBC -- 11.3* 10.4 11.5*  HGB -- 10.1* 10.6* 11.3*  PLT -- 213 199 217  LABCREA -- -- -- --  CREATININE 2.37* 2.08* 1.05 --   Estimated Creatinine Clearance: 32.3 ml/min (by C-G formula based on Cr of 2.37).  Basename 11/19/11 1119  VANCOTROUGH --  VANCOPEAK --  VANCORANDOM 32.6  GENTTROUGH --  GENTPEAK --  GENTRANDOM --  TOBRATROUGH --  TOBRAPEAK --  TOBRARND --  AMIKACINPEAK --  AMIKACINTROU --  AMIKACIN --     Microbiology: Recent Results (from the past 720 hour(s))  CULTURE, BLOOD (ROUTINE X 2)     Status: Normal (Preliminary result)   Collection Time   11/17/11  5:32 AM      Component Value Range Status Comment   Specimen Description BLOOD RIGHT ARM   Final    Special Requests BOTTLES DRAWN AEROBIC AND ANAEROBIC 5CC EA   Final    Culture  Setup Time [id number]   Final    Culture     Final    Value: GRAM POSITIVE COCCI IN CLUSTERS     Note: Gram Stain Report Called to,Read Back By and Verified With: MIKE GALLIGAN @ 1900 ON 11/18/11 BY GOLLD   Report Status PENDING   Incomplete   CULTURE, BLOOD (ROUTINE X 2)     Status: Normal (Preliminary result)   Collection Time   11/17/11  5:43 AM      Component Value Range Status Comment   Specimen Description BLOOD RIGHT HAND   Final    Special Requests     Final    Value: BOTTLES DRAWN AEROBIC AND ANAEROBIC 5CC BLUE 4CC RED    Culture  Setup Time [id number]   Final    Culture     Final    Value:        BLOOD CULTURE RECEIVED NO GROWTH TO DATE CULTURE WILL BE HELD FOR 5 DAYS BEFORE ISSUING A FINAL NEGATIVE REPORT   Report Status PENDING   Incomplete   URINE CULTURE     Status: Normal   Collection Time   11/17/11  5:49 AM      Component Value Range Status Comment   Specimen Description URINE, CLEAN CATCH   Final    Special Requests NONE   Final    Culture  Setup Time [id number]   Final    Colony Count NO GROWTH   Final    Culture NO GROWTH   Final    Report Status 11/18/2011 FINAL   Final    Assessment:    Scr up to 2.36.  Random vancomycin level = 32.6 mcg/ml. Level drawn ~ 12 hrs after last dose of Vancomycin 1250 mg IV q12h regimen.  Therapeutic, but too high to resume further doses.  Last Vanc dose given ~12:30 am today.   Gram + cocci in clusters in 1 of 2 blood cultures. Tmax 100.1.  WBC 11.3.  Acute renal insufficiency.  Diuretic, ARB and Metformin now on hold.  Goal of Therapy:  Vancomycin trough level 10-15 mcg/ml; Appropriate Zosyn dose for renal function and infection.  Plan:   No further Vancomycin doses until level falls into target trough range. Will re-check random Vanc level in am. Will follow renal status with you, as well as final culture results.   Zosyn dose remains appropriate.  Dennie Fetters, [place] Pager: [phone] 11/19/2011,2:28 PM

## 2011-11-19 NOTE — Progress Notes (Addendum)
Called to assist with patient with BP 80/50 HR 118 RR 24 increased O2 needs from RA to 4 liter n/c with O2 sat at 93 and oral temp 102.4.  CBG 82.   Patient admitted 2 days ago with cellulits right groin.  Patient is legally blind - arouses to nam-e  oriented- denies pain or SOB - skin cool moist - has ice packs in axillary.  Bil BS with some exp wheeze - RR 24 - no distress.  Abd soft no pain.  Right groin area with silver pad dressing - foul smelling odor noted.  NS infusing bolus - increased rate to 999 cc/hr.  Foley patent dark amber urine - 120 cc TOTAL this 12 hour shift.  Labs noted.  After 500 cc NS IV fluid - bp 102/54  HR 108 RR 22 O2 sats remain 92%.  Also noted decreased BP last pm with fluid bolus treatment.  RN Morrie Sheldon spoke with Dr. Wylene Simmer - reported above.  Requested patient be transferred to higher level of care for hypotension.  Orders given.  Dr. Marchelle Gearing called and spoke with Morrie Sheldon, RN.  For transfer to ICU.   Zoysyn started.  1745  BP 92/64 HR 110 RR 26  O2 sat 91% on 4 liters.  NS bolus continuing to infuse.

## 2011-11-19 NOTE — Consult Note (Signed)
Name: [name] MRN: [id number] DOB: [birth year]  LOS: 2  CRITICAL CARE CONSULT NOTE  History of Present Illness: This is a pleasant 66 y/o female with DM2 who was admitted on 4/7 with R groin pain.  A bedside ultrasound performed by the EDP did not reveal a frank pocket of pus.  She was treated with augmentin initially but her blood cultures became positive in the evening of 4/8.  On 4/9 she became more hypotensive and oliguric despite fluid boluses so she was transferred to the ICU for further monitoring.  She notes that her groin is feeling better.  Lines / Drains:   Cultures / Sepsis markers: 4/7 Blood culture >> positive for GPC in 1/2 4/7 urine culture >> ngtd  Antibiotics: 4/7 vanc >> 4/9 zosyn >> 4/7 augmentin >> 4/9  Tests / Events:      Past Medical History  Diagnosis Date  . Hypertension   . Diabetes mellitus   . Blind    History reviewed. No pertinent past surgical history. Prior to Admission medications   Medication Sig Start Date End Date Taking? Authorizing Provider  amLODipine-valsartan (EXFORGE) 10-320 MG per tablet Take 1 tablet by mouth daily.   Yes Historical Provider, MD  aspirin EC 81 MG tablet Take 81 mg by mouth daily.   Yes Historical Provider, MD  Calcium 1500 MG tablet Take 1,500-3,000 mg by mouth 2 (two) times daily. Takes 2 in the morning and 1 at night   Yes Historical Provider, MD  cholecalciferol (VITAMIN D) 1000 UNITS tablet Take 1,000 Units by mouth daily.   Yes Historical Provider, MD  cilostazol (PLETAL) 100 MG tablet Take 100 mg by mouth daily.   Yes Historical Provider, MD  Dexlansoprazole (DEXILANT) 30 MG capsule Take 30 mg by mouth daily.   Yes Historical Provider, MD  docusate sodium (COLACE) 100 MG capsule Take 100 mg by mouth 2 (two) times daily as needed. Stool softener   Yes Historical Provider, MD  gabapentin (NEURONTIN) 300 MG capsule Take 300 mg by mouth at bedtime.   Yes Historical Provider, MD  glimepiride (AMARYL) 4 MG  tablet Take 2 mg by mouth 2 (two) times daily before a meal.   Yes Historical Provider, MD  insulin glargine (LANTUS) 100 UNIT/ML injection Inject 40 Units into the skin at bedtime.   Yes Historical Provider, MD  metFORMIN (GLUCOPHAGE) 1000 MG tablet Take 1,000 mg by mouth daily with breakfast.   Yes Historical Provider, MD  prednisoLONE acetate (PRED FORTE) 1 % ophthalmic suspension Place 1 drop into both eyes 2 (two) times daily.   Yes Historical Provider, MD  saxagliptin HCl (ONGLYZA) 5 MG TABS tablet Take 5 mg by mouth daily.   Yes Historical Provider, MD  torsemide (DEMADEX) 20 MG tablet Take 10 mg by mouth daily.   Yes Historical Provider, MD   No Known Allergies No family history on file. Social History  reports that she has never smoked. She does not have any smokeless tobacco history on file. She reports that she does not drink alcohol. Her drug history not on file.  Review Of Systems   Gen: Noted fever, chills on admission, none now, denies weight change, fatigue, night sweats HEENT: Denies blurred vision, double vision, hearing loss, tinnitus, sinus congestion, rhinorrhea, sore throat, neck stiffness, dysphagia PULM: Denies shortness of breath, cough, sputum production, hemoptysis, wheezing CV: Denies chest pain, edema, orthopnea, paroxysmal nocturnal dyspnea, palpitations GI: Denies abdominal pain, nausea, vomiting, diarrhea, hematochezia, melena, constipation, change in bowel  habits GU: Denies dysuria, hematuria, polyuria, oliguria, urethral discharge Endocrine: Denies hot or cold intolerance, polyuria, polyphagia or appetite change Derm:per hpi Heme: Denies easy bruising, bleeding, bleeding gums Neuro: Denies headache, numbness, weakness, slurred speech, loss of memory or consciousness  Vital Signs:   Filed Vitals:   11/19/11 1654 11/19/11 1830 11/19/11 1845 11/19/11 1942  BP: 82/52 105/76 95/67   Pulse: 103 107 101   Temp: 100.3 F (37.9 C)   97.2 F (36.2 C)  TempSrc:     Oral  Resp:   16   Height:      Weight:      SpO2: 95% 96% 96%     Physical Examination: Gen: well appearing, no acute distress HEENT: NCAT,scleritis noted, EOMi, OP clear,  Neck: supple without masses PULM: few insp rales in bases, otherwise clear CV: Tachycardic, no mgr, no JVD AB: BS+, soft, nontender, no hsm Ext: warm, no edema, no clubbing, no cyanosis Derm: slight erythema and foul smell from right groin but no pus or skin breakdown, no tenderness on my exam Neuro: A&Ox4, CN III-XII intact, MAEW Psyche: Normal mood and affect  Labs and Imaging:   CBC    Component Value Date/Time   WBC 11.3* 11/19/2011 0530   RBC 3.58* 11/19/2011 0530   HGB 10.1* 11/19/2011 0530   HCT 30.9* 11/19/2011 0530   PLT 213 11/19/2011 0530   MCV 86.3 11/19/2011 0530   MCH 28.2 11/19/2011 0530   MCHC 32.7 11/19/2011 0530   RDW 14.4 11/19/2011 0530   LYMPHSABS 1.4 11/17/2011 0454   MONOABS 0.4 11/17/2011 0454   EOSABS 0.1 11/17/2011 0454   BASOSABS 0.0 11/17/2011 0454    BMET    Component Value Date/Time   NA 136 11/19/2011 1119   K 5.0 11/19/2011 1119   CL 102 11/19/2011 1119   CO2 24 11/19/2011 1119   GLUCOSE 116* 11/19/2011 1119   BUN 37* 11/19/2011 1119   CREATININE 2.37* 11/19/2011 1119   CALCIUM 10.5 11/19/2011 1119   GFRNONAA 20* 11/19/2011 1119   GFRAA 23* 11/19/2011 1119     Assessment and Plan:  66 y/o female with poorly controlled diabetes presented on 11/17/2011 with cellulitis of the right groin.  She became hypotensive with low urine output and a rising WBC count on 4/9 and was transferred to 3100 for further management.  After receiving a one liter bolus here her BP and HR improved markedly.  It does appear that she has sepsis, although paradoxically her skin infection appears to be improving (less tender, less swollen per her).  DDx of infectious etiology is either an infection not covered by vanc/augmentin and now improving with zosyn vs. a second new infection.    Cellulitis of left groin (11/17/2011)    Assessment: ddx includes MRSA vs. Polymicrobial based on diabetes vs. Less likely Fornier's.  She does not have crepitance and her exam is improving so Fornier's seems less likely.  Seems to be improving now on Zosyn.   Plan:  -will repeat cultures and a bmet now -agree with antibiotics -if deteriorates again, would consider imaging of groin (CT)  Sepsis (11/19/2011)   Assessment: likely due groin infection, but consider other cause (persistent bacteremia? Vs. Less likely pneumonia or uti)   Plan:  -repeat u/a and cxr -repeat blood culture  Acute renal failure (11/19/2011)   Assessment: oliguric/prerenal due to sepsis   Plan:  -follow UOP closely -increase IVF -repeat BMET  Type II or unspecified type diabetes mellitus with unspecified complication,  uncontrolled (11/17/2011)   Assessment: well controlled in hospital   Plan: -continue current insulin plan  Best practices / Disposition: Keep on SDU status as not hypotensive and improving, PCCM consulting and not primary service  Heber [place], M.D. Pulmonary and Critical Care Medicine [facility] Pager: [phone]  11/19/2011, 7:36 PM

## 2011-11-19 NOTE — Progress Notes (Signed)
Subjective: Doing well overnight.  Has not had a BM, low grade fever. Blood cultures now positive for Gram Negative rods.  Objective: Vital signs in last 24 hours: Temp:  [98 F (36.7 C)-99.8 F (37.7 C)] 99.8 F (37.7 C) (04/09 0600) Pulse Rate:  [103-117] 112  (04/09 0600) Resp:  [20] 20  (04/09 0600) BP: (94-138)/(42-68) 94/60 mmHg (04/09 0600) SpO2:  [93 %-96 %] 94 % (04/09 0600) Weight change:  Last BM Date: 11/16/11  Intake/Output from previous day: 04/08 0701 - 04/09 0700 In: -  Out: 900 [Urine:900] Intake/Output this shift:  General appearance: alert, cooperative and appears stated age  Neck: no adenopathy, no carotid bruit, no JVD, supple, symmetrical, trachea midline and thyroid not enlarged, symmetric, no tenderness/mass/nodules  Resp: clear to auscultation bilaterally  Cardio: regular rate and rhythm, S1, S2 normal, no murmur, click, rub or gallop  GI: soft, non-tender; bowel sounds normal; no masses, no organomegaly  Extremities: extremities normal, atraumatic, no cyanosis, trace edema  Skin: Skin color, texture, turgor normal. No rashes or lesions  Lymph nodes: Inguinal adenopathy: R groin nodes positive   Lab Results:  Basename 11/19/11 0530 11/18/11 0545  WBC 11.3* 10.4  HGB 10.1* 10.6*  HCT 30.9* 32.5*  PLT 213 199   BMET  Basename 11/19/11 0530 11/18/11 0545  NA 135 135  K 4.6 4.3  CL 102 101  CO2 24 25  GLUCOSE 133* 163*  BUN 35* 20  CREATININE 2.08* 1.05  CALCIUM 10.3 10.1    Studies/Results: No results found.  Medications:  I have reviewed the patient's current medications. Scheduled:   . amLODipine  10 mg Oral Daily  . aspirin EC  81 mg Oral Daily  . calcium carbonate  2 tablet Oral Daily   And  . calcium carbonate  1 tablet Oral QPM  . cholecalciferol  1,000 Units Oral Daily  . cilostazol  100 mg Oral Daily  . docusate sodium  100 mg Oral BID  . enoxaparin (LOVENOX) injection  70 mg Subcutaneous Q24H  . gabapentin  300 mg Oral  QHS  . glimepiride  2 mg Oral BID AC  . insulin aspart  0-15 Units Subcutaneous TID WC  . insulin aspart  0-5 Units Subcutaneous QHS  . insulin glargine  40 Units Subcutaneous QHS  . linagliptin  5 mg Oral Daily  . oxyCODONE  10 mg Oral Q12H  . pantoprazole  40 mg Oral Q1200  . prednisoLONE acetate  1 drop Both Eyes BID  . vancomycin  1,250 mg Intravenous Q12H  . DISCONTD: amLODipine  10 mg Oral Daily  . DISCONTD: amoxicillin-clavulanate  1 tablet Oral Q12H  . DISCONTD: enoxaparin  40 mg Subcutaneous Q24H  . DISCONTD: irbesartan  300 mg Oral Daily  . DISCONTD: metFORMIN  1,000 mg Oral BID WC  . DISCONTD: torsemide  10 mg Oral Daily   Continuous:   . sodium chloride 75 mL/hr at 11/18/11 0814  . DISCONTD: sodium chloride 50 mL/hr at 11/17/11 1104   HYQ:MVHQIONGEXBMW, acetaminophen, docusate sodium, fentaNYL, HYDROcodone-acetaminophen, morphine, polyethylene glycol  Assessment/Plan: 1) Gram negative sepsis from Groin abscess- boil. Wound care consult.on Vancomycin and Zosyn in place of PO Augmentin.  Will await ID/Sensitivities. 2) DM2 controlled, SSI. Her A1C was 7.6, which is good for her. We have concerns with hypos given her lack of vision. Held Metformin due to increase in Creatinine 3) HTN Controlled, slightly low, see adjustments below 4) Hyperlipidemia- Cont meds.  5) Added OxyContin 10mg  bid standing  for pain control. Has tolerated this in the past when she broke her shoulder. Hopefully will limit PRN use.  6) Acute Renal Insufficiency.  Likely due to sepsis.  Held diuretic and Irbesartan, encouraged PO fluids, keep IVF going.  Will renally dose meds.  Metformin held as above.  If trends lower, will need to adjust insulin as well. 7) Dispo-  Has excellent family support.   LOS: 2 days   Armenta Erskin W 11/19/2011, 7:24 AM

## 2011-11-19 NOTE — Progress Notes (Signed)
PHARMACY - ANTIBIOTIC CONSULT  Initial Consult Note  Pharmacy Consult for: Zosyn  Indication: Empiric   Patient Data:   Allergies: No Known Allergies  Patient Measurements: Height: 5\' 5"  (165.1 cm) Weight: 301 lb (136.533 kg) IBW/kg (Calculated) : 57   Vital Signs: Temp:  [98 F (36.7 C)-99.8 F (37.7 C)] 99.8 F (37.7 C) (04/09 0600) Pulse Rate:  [103-117] 112  (04/09 0600) Resp:  [20] 20  (04/09 0600) BP: (94-138)/(42-68) 94/60 mmHg (04/09 0600) SpO2:  [93 %-96 %] 94 % (04/09 0600)  Intake/Output from previous day:  Intake/Output Summary (Last 24 hours) at 11/19/11 0718 Last data filed at 11/19/11 0500  Gross per 24 hour  Intake      0 ml  Output    900 ml  Net   -900 ml    Labs:  Basename 11/19/11 0530 11/18/11 0545 11/17/11 1129  WBC 11.3* 10.4 11.5*  HGB 10.1* 10.6* 11.3*  PLT 213 199 217  LABCREA -- -- --  CREATININE 2.08* 1.05 0.97   Estimated Creatinine Clearance: 36.8 ml/min (by C-G formula based on Cr of 2.08). No results found for this basename: VANCOTROUGH:2,VANCOPEAK:2,VANCORANDOM:2,GENTTROUGH:2,GENTPEAK:2,GENTRANDOM:2,TOBRATROUGH:2,TOBRAPEAK:2,TOBRARND:2,AMIKACINPEAK:2,AMIKACINTROU:2,AMIKACIN:2, in the last 72 hours   Microbiology: Recent Results (from the past 720 hour(s))  CULTURE, BLOOD (ROUTINE X 2)     Status: Normal (Preliminary result)   Collection Time   11/17/11  5:32 AM      Component Value Range Status Comment   Specimen Description BLOOD RIGHT ARM   Final    Special Requests BOTTLES DRAWN AEROBIC AND ANAEROBIC 5CC EA   Final    Culture  Setup Time [id number]   Final    Culture     Final    Value: GRAM POSITIVE COCCI IN CLUSTERS     Note: Gram Stain Report Called to,Read Back By and Verified With: MIKE GALLIGAN @ 1900 ON 11/18/11 BY GOLLD   Report Status PENDING   Incomplete   CULTURE, BLOOD (ROUTINE X 2)     Status: Normal (Preliminary result)   Collection Time   11/17/11  5:43 AM      Component Value Range Status Comment   Specimen Description BLOOD RIGHT HAND   Final    Special Requests     Final    Value: BOTTLES DRAWN AEROBIC AND ANAEROBIC 5CC BLUE 4CC RED   Culture  Setup Time [id number]   Final    Culture     Final    Value:        BLOOD CULTURE RECEIVED NO GROWTH TO DATE CULTURE WILL BE HELD FOR 5 DAYS BEFORE ISSUING A FINAL NEGATIVE REPORT   Report Status PENDING   Incomplete   URINE CULTURE     Status: Normal   Collection Time   11/17/11  5:49 AM      Component Value Range Status Comment   Specimen Description URINE, CLEAN CATCH   Final    Special Requests NONE   Final    Culture  Setup Time [id number]   Final    Colony Count NO GROWTH   Final    Culture NO GROWTH   Final    Report Status 11/18/2011 FINAL   Final     Medical History: Past Medical History  Diagnosis Date  . Hypertension   . Diabetes mellitus   . Blind     Scheduled Medications:     . amLODipine  10 mg Oral Daily  . aspirin EC  81 mg Oral Daily  . calcium  carbonate  2 tablet Oral Daily   And  . calcium carbonate  1 tablet Oral QPM  . cholecalciferol  1,000 Units Oral Daily  . cilostazol  100 mg Oral Daily  . docusate sodium  100 mg Oral BID  . enoxaparin (LOVENOX) injection  70 mg Subcutaneous Q24H  . gabapentin  300 mg Oral QHS  . glimepiride  2 mg Oral BID AC  . insulin aspart  0-15 Units Subcutaneous TID WC  . insulin aspart  0-5 Units Subcutaneous QHS  . insulin glargine  40 Units Subcutaneous QHS  . linagliptin  5 mg Oral Daily  . oxyCODONE  10 mg Oral Q12H  . pantoprazole  40 mg Oral Q1200  . prednisoLONE acetate  1 drop Both Eyes BID  . vancomycin  1,250 mg Intravenous Q12H  . DISCONTD: amLODipine  10 mg Oral Daily  . DISCONTD: amoxicillin-clavulanate  1 tablet Oral Q12H  . DISCONTD: enoxaparin  40 mg Subcutaneous Q24H  . DISCONTD: irbesartan  300 mg Oral Daily  . DISCONTD: metFORMIN  1,000 mg Oral BID WC  . DISCONTD: torsemide  10 mg Oral Daily    Assessment:  66 y.o. female admitted on  11/17/2011, with groin infection. Patient is on D3 of vancomycin, and pharmacy now consulted to manage Zosyn. SrCr increased 1.05 --> 2.08 overnight. Patient with 1/2 blood cultures with GPC in clusters.   Goal of Therapy:  1. Vancomycin level 10-20 mcg/mL   Plan:  1. Zosyn 3.375gm IV Q8H (4 hr infusion).  2. D/c current vancomycin dosing due to SrCr increase.  3. Vancomycin level, BMET at 11:30 today.   Dineen Kid Thad Ranger, PharmD 11/19/2011, 7:18 AM

## 2011-11-19 NOTE — Clinical Documentation Improvement (Signed)
RENAL FAILURE DOCUMENTATION CLARIFICATION QUERY  THIS DOCUMENT IS NOT A PERMANENT PART OF THE MEDICAL RECORD   Please update your documentation within the medical record to reflect your response to this query.                                                                                    11/19/11  Dear Dr.Eliora Nienhuis  In a better effort to capture your patient's severity of illness, reflect appropriate length of stay and utilization of resources, a review of the patient medical record has revealed the following indicators.   Based on your clinical judgment, please clarify and document in a progress note and/or discharge summary the clinical condition associated with the following supporting information: In responding to this query please exercise your independent judgment.  The fact that a query is asked, does not imply that any particular answer is desired or expected.   "Acute Renal Insufficiency" was documented in the 4/9 progress note. Please consider the below (if your clinical findings/judgment agree) as you document the patient's diagnosis/condition(s) in the progress note and discharge summary. Thank you!  Possible Clinical Conditions?  - Acute Renal Failure  - Acute Kidney Injury  - Other condition (please document in the progress notes and/or discharge summary)  - Cannot Clinically determine at this time   Supporting Information:  - "Acute Renal Insufficiency. Likely due to sepsis. Held diuretic and Irbesartan, encouraged PO fluids, keep IVF going. Will renally dose meds. Metformin held as above. If trends lower, will need to adjust insulin as well." progress note 4/9  Component GFR calc non Af Amer  Latest Ref Rng >90 mL/min  11/17/2011 59 (L)  11/18/2011 54 (L)  11/19/2011 24 (L)   Component BUN Creat  Latest Ref Rng 6 - 23 mg/dL 1.61 - 0.96 mg/dL  0/11/5407 16 8.11 (H)  11/17/2011  0.97  11/18/2011 20 1.05  11/19/2011 35 (H) 2.08 (H)    ** You may use possible, probable,  or suspect with inpatient documentation. possible, probable, suspected diagnoses MUST be documented at the time of discharge.    Reviewed:  no additional documentation provided  Thank You,  Saul Fordyce  Clinical Documentation Specialist: [phone] Pager  Health Information Management [place]    TO RESPOND TO THE THIS QUERY, FOLLOW THE INSTRUCTIONS BELOW:  1. If needed, update documentation for the patient's encounter via the notes activity.  2. Access this query again and click edit on the In [employer].  3. After updating, or not, click F2 to complete all highlighted (required) fields concerning your review. Select "additional documentation in the medical record" OR "no additional documentation provided".  4. Click Sign note button.  5. The deficiency will fall out of your In Basket *Please let us know if you are not able to complete this workflow by phone or e-mail (listed below).

## 2011-11-20 ENCOUNTER — Inpatient Hospital Stay (HOSPITAL_COMMUNITY): Payer: Medicare HMO | Admitting: Anesthesiology

## 2011-11-20 ENCOUNTER — Encounter (HOSPITAL_COMMUNITY): Payer: Self-pay | Admitting: Anesthesiology

## 2011-11-20 ENCOUNTER — Encounter (HOSPITAL_COMMUNITY): Payer: Self-pay | Admitting: General Surgery

## 2011-11-20 ENCOUNTER — Encounter (HOSPITAL_COMMUNITY)
Admission: EM | Disposition: A | Discharge status: Home / Self Care | Payer: Self-pay | Source: Home / Self Care | Attending: Internal Medicine

## 2011-11-20 DIAGNOSIS — E119 Type 2 diabetes mellitus without complications: Secondary | ICD-10-CM

## 2011-11-20 DIAGNOSIS — K219 Gastro-esophageal reflux disease without esophagitis: Secondary | ICD-10-CM

## 2011-11-20 DIAGNOSIS — L03319 Cellulitis of trunk, unspecified: Secondary | ICD-10-CM

## 2011-11-20 DIAGNOSIS — L02219 Cutaneous abscess of trunk, unspecified: Secondary | ICD-10-CM

## 2011-11-20 HISTORY — PX: INCISION AND DRAINAGE PERIRECTAL ABSCESS: SHX1804

## 2011-11-20 LAB — COMPREHENSIVE METABOLIC PANEL
ALT: 10 U/L (ref 0–35)
AST: 20 U/L (ref 0–37)
Albumin: 2.5 g/dL — ABNORMAL LOW (ref 3.5–5.2)
Alkaline Phosphatase: 64 U/L (ref 39–117)
BUN: 39 mg/dL — ABNORMAL HIGH (ref 6–23)
Chloride: 104 mEq/L (ref 96–112)
Potassium: 4.4 mEq/L (ref 3.5–5.1)
Total Bilirubin: 0.1 mg/dL — ABNORMAL LOW (ref 0.3–1.2)

## 2011-11-20 LAB — GLUCOSE, CAPILLARY
Glucose-Capillary: 157 mg/dL — ABNORMAL HIGH (ref 70–99)
Glucose-Capillary: 88 mg/dL (ref 70–99)

## 2011-11-20 LAB — VANCOMYCIN, RANDOM: Vancomycin Rm: 21.7 ug/mL

## 2011-11-20 LAB — CULTURE, BLOOD (ROUTINE X 2): Culture  Setup Time: 201304071139

## 2011-11-20 LAB — CBC
HCT: 27.2 % — ABNORMAL LOW (ref 36.0–46.0)
RDW: 14.8 % (ref 11.5–15.5)
WBC: 8 10*3/uL (ref 4.0–10.5)

## 2011-11-20 LAB — LACTIC ACID, PLASMA: Lactic Acid, Venous: 0.6 mmol/L (ref 0.5–2.2)

## 2011-11-20 SURGERY — INCISION AND DRAINAGE, ABSCESS, PERIRECTAL
Anesthesia: General | Site: Groin | Laterality: Right | Wound class: Dirty or Infected

## 2011-11-20 MED ORDER — LACTATED RINGERS IV SOLN: INTRAVENOUS | Status: DC

## 2011-11-20 MED ORDER — LACTATED RINGERS IV SOLN
INTRAVENOUS | Status: DC | PRN
  Administered 2011-11-20: 15:00:00 via INTRAVENOUS

## 2011-11-20 MED ORDER — FENTANYL CITRATE 0.05 MG/ML IJ SOLN
INTRAMUSCULAR | Status: DC | PRN
  Administered 2011-11-20: 50 ug via INTRAVENOUS
  Administered 2011-11-20: 100 ug via INTRAVENOUS

## 2011-11-20 MED ORDER — VANCOMYCIN HCL 1000 MG IV SOLR
1250.0000 mg | INTRAVENOUS | Status: DC
  Administered 2011-11-20 – 2011-11-24 (×5): 1250 mg via INTRAVENOUS
  Filled 2011-11-20 (×6): qty 1250

## 2011-11-20 MED ORDER — ENOXAPARIN SODIUM 80 MG/0.8ML ~~LOC~~ SOLN
70.0000 mg | SUBCUTANEOUS | Status: DC
  Administered 2011-11-21 – 2011-11-25 (×5): 70 mg via SUBCUTANEOUS
  Filled 2011-11-20 (×5): qty 0.8

## 2011-11-20 MED ORDER — EPHEDRINE SULFATE 50 MG/ML IJ SOLN
INTRAMUSCULAR | Status: DC | PRN
  Administered 2011-11-20 (×2): 10 mg via INTRAVENOUS

## 2011-11-20 MED ORDER — 0.9 % SODIUM CHLORIDE (POUR BTL) OPTIME
TOPICAL | Status: DC | PRN
  Administered 2011-11-20: 1000 mL

## 2011-11-20 MED ORDER — ONDANSETRON HCL 4 MG/2ML IJ SOLN: 4.0000 mg | Freq: Four times a day (QID) | INTRAMUSCULAR | Status: DC | PRN

## 2011-11-20 MED ORDER — PROPOFOL 10 MG/ML IV EMUL
INTRAVENOUS | Status: DC | PRN
  Administered 2011-11-20: 180 mg via INTRAVENOUS

## 2011-11-20 MED ORDER — SODIUM CHLORIDE 0.9 % IV SOLN: INTRAVENOUS | Status: DC

## 2011-11-20 MED ORDER — HYDROMORPHONE HCL PF 1 MG/ML IJ SOLN: 0.2500 mg | INTRAMUSCULAR | Status: DC | PRN

## 2011-11-20 MED ORDER — SODIUM CHLORIDE 0.9 % IV SOLN
INTRAVENOUS | Status: DC | PRN
  Administered 2011-11-20: 16:00:00 via INTRAVENOUS

## 2011-11-20 MED ORDER — ONDANSETRON HCL 4 MG/2ML IJ SOLN
INTRAMUSCULAR | Status: DC | PRN
  Administered 2011-11-20: 4 mg via INTRAVENOUS

## 2011-11-20 MED ORDER — SUCCINYLCHOLINE CHLORIDE 20 MG/ML IJ SOLN
INTRAMUSCULAR | Status: DC | PRN
  Administered 2011-11-20: 100 mg via INTRAVENOUS

## 2011-11-20 MED ORDER — SODIUM CHLORIDE 0.9 % IV SOLN
INTRAVENOUS | Status: DC
  Administered 2011-11-20: 05:00:00 via INTRAVENOUS
  Administered 2011-11-21: 50 mL/h via INTRAVENOUS
  Administered 2011-11-23 – 2011-11-24 (×2): via INTRAVENOUS

## 2011-11-20 SURGICAL SUPPLY — 36 items
BANDAGE GAUZE ELAST BULKY 4 IN (GAUZE/BANDAGES/DRESSINGS) ×1 IMPLANT
BLADE SURG 15 STRL LF DISP TIS (BLADE) ×1 IMPLANT
BLADE SURG 15 STRL SS (BLADE) ×2
BRIEF STRETCH FOR OB PAD LRG (UNDERPADS AND DIAPERS) ×1 IMPLANT
CANISTER SUCTION 2500CC (MISCELLANEOUS) ×2 IMPLANT
CLEANER TIP ELECTROSURG 2X2 (MISCELLANEOUS) IMPLANT
CLOTH BEACON ORANGE TIMEOUT ST (SAFETY) ×2 IMPLANT
COVER SURGICAL LIGHT HANDLE (MISCELLANEOUS) ×2 IMPLANT
DRAPE LAPAROTOMY T 102X78X121 (DRAPES) ×1 IMPLANT
DRAPE UTILITY 15X26 W/TAPE STR (DRAPE) ×4 IMPLANT
DRSG PAD ABDOMINAL 8X10 ST (GAUZE/BANDAGES/DRESSINGS) ×2 IMPLANT
ELECT REM PT RETURN 9FT ADLT (ELECTROSURGICAL)
ELECTRODE REM PT RTRN 9FT ADLT (ELECTROSURGICAL) IMPLANT
GAUZE PACKING IODOFORM 1 (PACKING) ×1 IMPLANT
GAUZE SPONGE 4X4 16PLY XRAY LF (GAUZE/BANDAGES/DRESSINGS) ×2 IMPLANT
GLOVE BIO SURGEON STRL SZ8 (GLOVE) ×2 IMPLANT
GLOVE BIOGEL PI IND STRL 8 (GLOVE) ×1 IMPLANT
GLOVE BIOGEL PI INDICATOR 8 (GLOVE) ×1
GOWN PREVENTION PLUS XLARGE (GOWN DISPOSABLE) ×2 IMPLANT
GOWN STRL NON-REIN LRG LVL3 (GOWN DISPOSABLE) ×4 IMPLANT
KIT BASIN OR (CUSTOM PROCEDURE TRAY) ×2 IMPLANT
KIT ROOM TURNOVER OR (KITS) ×2 IMPLANT
NS IRRIG 1000ML POUR BTL (IV SOLUTION) ×2 IMPLANT
PACK LITHOTOMY IV (CUSTOM PROCEDURE TRAY) ×2 IMPLANT
PAD ARMBOARD 7.5X6 YLW CONV (MISCELLANEOUS) ×4 IMPLANT
PENCIL BUTTON HOLSTER BLD 10FT (ELECTRODE) IMPLANT
SPONGE GAUZE 4X4 12PLY (GAUZE/BANDAGES/DRESSINGS) ×2 IMPLANT
SWAB COLLECTION DEVICE MRSA (MISCELLANEOUS) ×2 IMPLANT
TAPE CLOTH SURG 4X10 WHT LF (GAUZE/BANDAGES/DRESSINGS) ×1 IMPLANT
TOWEL OR 17X24 6PK STRL BLUE (TOWEL DISPOSABLE) ×2 IMPLANT
TOWEL OR 17X26 10 PK STRL BLUE (TOWEL DISPOSABLE) ×2 IMPLANT
TUBE ANAEROBIC SPECIMEN COL (MISCELLANEOUS) ×2 IMPLANT
TUBE CONNECTING 12X1/4 (SUCTIONS) ×2 IMPLANT
UNDERPAD 30X30 INCONTINENT (UNDERPADS AND DIAPERS) ×2 IMPLANT
WATER STERILE IRR 1000ML POUR (IV SOLUTION) ×2 IMPLANT
YANKAUER SUCT BULB TIP NO VENT (SUCTIONS) ×3 IMPLANT

## 2011-11-20 NOTE — Consult Note (Signed)
Name: [name] MRN: [id number] DOB: [birth year]  LOS: 3  CRITICAL CARE CONSULT NOTE  History of Present Illness: This is a pleasant 66 y/o female with DM2 who was admitted on 4/7 with R groin pain.  A bedside ultrasound performed by the EDP did not reveal a frank pocket of pus.  She was treated with augmentin initially but her blood cultures became positive in the evening of 4/8.  On 4/9 she became more hypotensive and oliguric despite fluid boluses so she was transferred to the ICU for further monitoring.  She notes that her groin is feeling better.  Lines / Drains:   Cultures / Sepsis markers: 4/7 Blood culture >> positive for GPC in 1/2>>>coag neg staph 4/7 urine culture >> ngtd  Antibiotics: 4/7 vanc >> 4/9 zosyn >> 4/7 augmentin >> 4/9  Tests / Events: 4/10- no pressors required    Vital Signs:   Filed Vitals:   11/20/11 0800 11/20/11 0830 11/20/11 0900 11/20/11 1000  BP: 123/54 61/44 124/44 124/54  Pulse: 91 104 108 103  Temp:      TempSrc:      Resp: 14 20 15 16   Height:      Weight:      SpO2: 97% 97% 96% 93%    General: no distress Neuro: nonfocal HEENT: obese, jvd wnl PULM: reduced CV: s1 s2 RRR no m, distant GI: soft, BS wnl, no r/g Extremities: no edema Groin: rt  Dressed, min drainage, erythema, indurated, hard tender, warm   Labs and Imaging:   CBC    Component Value Date/Time   WBC 8.0 11/20/2011 0525   RBC 3.10* 11/20/2011 0525   HGB 8.9* 11/20/2011 0525   HCT 27.2* 11/20/2011 0525   PLT 216 11/20/2011 0525   MCV 87.7 11/20/2011 0525   MCH 28.7 11/20/2011 0525   MCHC 32.7 11/20/2011 0525   RDW 14.8 11/20/2011 0525   LYMPHSABS 1.4 11/17/2011 0454   MONOABS 0.4 11/17/2011 0454   EOSABS 0.1 11/17/2011 0454   BASOSABS 0.0 11/17/2011 0454    BMET    Component Value Date/Time   NA 135 11/20/2011 0525   K 4.4 11/20/2011 0525   CL 104 11/20/2011 0525   CO2 23 11/20/2011 0525   GLUCOSE 72 11/20/2011 0525   BUN 39* 11/20/2011 0525   CREATININE 2.08*  11/20/2011 0525   CALCIUM 10.1 11/20/2011 0525   GFRNONAA 24* 11/20/2011 0525   GFRAA 27* 11/20/2011 0525     Assessment and Plan:  66 y/o female with poorly controlled diabetes presented on 11/17/2011 with cellulitis of the right groin.  She became hypotensive with low urine output and a rising WBC count on 4/9 and was transferred to 3100 for further management.  After receiving a one liter bolus here her BP and HR improved markedly.  It does appear that she has sepsis, although paradoxically her skin infection appears to be improving (less tender, less swollen per her).  DDx of infectious etiology is either an infection not covered by vanc/augmentin and now improving with zosyn vs. a second new infection.    Cellulitis of left groin (11/17/2011)   Assessment: ddx includes MRSA vs. Polymicrobial based on diabetes vs. Less likely Fornier's. abscess   Plan:  continue polymicrobial coverage -low threshold add clindamycin for toxin inhibition if drops BP again -lactic acid x 1 -Will call surgery to consider I and D  Sepsis (11/19/2011)   Assessment: likely due groin infection   Plan:  See above Cortisol if  drops BP lacitc  Pulse pressure still wide, may need more volume May need cvl some concern edema on PCXR, bnp, pcxr in am   Acute renal failure (11/19/2011)   Assessment: oliguric/prerenal due to sepsis / ATN   Plan:  -follow [facility]OP closely -increase IVF -repeat BMET Treat abscess  Type II or unspecified type diabetes mellitus with unspecified complication, uncontrolled (11/17/2011)   Assessment: well controlled in hospital   Plan: -continue current insulin plan Dc oral meds  Mcarthur Rossetti. Tyson Alias, MD, FACP Pgr: [phone] [facility]

## 2011-11-20 NOTE — Consult Note (Signed)
Consent obtained after thorough explanation.  Will proceed emergently. Patient examined and I agree with the assessment and plan  [name], MD, MPH, FACS Pager: [phone]  11/20/2011 3:33 PM

## 2011-11-20 NOTE — Consult Note (Signed)
[name] [birth year]  [id number].   Primary Care MD: Dr. Wylene Simmer Requesting MD: Dr. Tyson Alias Chief Complaint/Reason for Consult: right thigh abscess HPI: This is a 66 yo black female who noticed last Thursday a quarter size abscess in her right groin.  She let it be and did nothing for it.  No fevers, chills, or sweats at home.  Due to pain and continued enlargement of this area, she presented to the [facility] on Sunday.  She was admitted and put on IV abx therapy.  Over the last 2 days, she has developed tachycardia and hypotension which have been responsive to fluid boluses.  She has run a fever as high as 102.6 in the last 24 hours.  We have been asked today to evaluate for possible need for incision and drainage.  Review of Systems: Please see HPI, otherwise all other systems have been reviewed and are negative.  No family history on file.  Past Medical History  Diagnosis Date  . Hypertension   . Diabetes mellitus   . Blind     History reviewed. No pertinent past surgical history.  Social History:  reports that she has never smoked. She does not have any smokeless tobacco history on file. She reports that she does not drink alcohol. Her drug history not on file.  Allergies: No Known Allergies  Medications Prior to Admission  Medication Dose Route Frequency Provider Last Rate Last Dose  . 0.9 %  sodium chloride infusion   Intravenous Continuous Lupita Leash, MD 250 mL/hr at 11/20/11 0030    . 0.9 %  sodium chloride infusion   Intravenous Continuous Zigmund Gottron, MD 50 mL/hr at 11/20/11 0800    . acetaminophen (TYLENOL) tablet 650 mg  650 mg Oral Q6H PRN Ravisankar R Avva, MD   650 mg at 11/19/11 1433   Or  . acetaminophen (TYLENOL) suppository 650 mg  650 mg Rectal Q6H PRN Ravisankar R Avva, MD      . acetaminophen (TYLENOL) tablet 650 mg  650 mg Oral Once Ravisankar R Avva, MD   650 mg at 11/17/11 0655  . aspirin EC tablet 81 mg  81 mg Oral Daily Ravisankar R  Avva, MD   81 mg at 11/20/11 1013  . calcium carbonate (OS-CAL - dosed in mg of elemental calcium) tablet 1,000 mg of elemental calcium  2 tablet Oral Daily Gaspar Garbe, MD   1,000 mg of elemental calcium at 11/20/11 1012   And  . calcium carbonate (OS-CAL - dosed in mg of elemental calcium) tablet 500 mg of elemental calcium  1 tablet Oral QPM Gaspar Garbe, MD   500 mg of elemental calcium at 11/19/11 1845  . cholecalciferol (VITAMIN D) tablet 1,000 Units  1,000 Units Oral Daily Ravisankar R Avva, MD   1,000 Units at 11/20/11 1010  . cilostazol (PLETAL) tablet 100 mg  100 mg Oral Daily Ravisankar R Avva, MD   100 mg at 11/20/11 1010  . docusate sodium (COLACE) capsule 100 mg  100 mg Oral BID PRN Ravisankar R Avva, MD      . docusate sodium (COLACE) capsule 100 mg  100 mg Oral BID Gaspar Garbe, MD   100 mg at 11/20/11 1013  . enoxaparin (LOVENOX) injection 70 mg  70 mg Subcutaneous Q24H Benny Lennert, PHARMD   70 mg at 11/19/11 1843  . fentaNYL (SUBLIMAZE) injection 50 mcg  50 mcg Intravenous Q2H PRN Hanley Seamen, MD   50 mcg  at 11/17/11 1105  . gabapentin (NEURONTIN) capsule 300 mg  300 mg Oral QHS Ravisankar R Avva, MD   300 mg at 11/19/11 2215  . HYDROcodone-acetaminophen (NORCO) 5-325 MG per tablet 1-2 tablet  1-2 tablet Oral Q4H PRN Ravisankar R Avva, MD   2 tablet at 11/18/11 1446  . insulin aspart (novoLOG) injection 0-15 Units  0-15 Units Subcutaneous TID WC Ravisankar R Avva, MD   3 Units at 11/20/11 1149  . insulin aspart (novoLOG) injection 0-5 Units  0-5 Units Subcutaneous QHS Ravisankar R Avva, MD   3 Units at 11/18/11 2344  . insulin glargine (LANTUS) injection 40 Units  40 Units Subcutaneous QHS Ravisankar R Avva, MD   40 Units at 11/19/11 2215  . morphine 2 MG/ML injection 1 mg  1 mg Intravenous Q2H PRN Ravisankar R Avva, MD      . oxyCODONE (OXYCONTIN) 12 hr tablet 10 mg  10 mg Oral Q12H Gaspar Garbe, MD   10 mg at 11/20/11 1010  . pantoprazole (PROTONIX)  EC tablet 40 mg  40 mg Oral Q1200 Ravisankar R Avva, MD   40 mg at 11/20/11 1149  . piperacillin-tazobactam (ZOSYN) IVPB 3.375 g  3.375 g Intravenous Q8H Gaspar Garbe, MD   3.375 g at 11/20/11 0910  . polyethylene glycol (MIRALAX / GLYCOLAX) packet 17 g  17 g Oral Daily PRN Gaspar Garbe, MD      . prednisoLONE acetate (PRED FORTE) 1 % ophthalmic suspension 1 drop  1 drop Both Eyes BID Juliette [name], [place]   1 drop at 11/20/11 1013  . sodium chloride 0.9 % bolus 1,000 mL  1,000 mL Intravenous Once Gaspar Garbe, MD   1,000 mL at 11/19/11 1445  . vancomycin (VANCOCIN) 1,250 mg in sodium chloride 0.9 % 250 mL IVPB  1,250 mg Intravenous Once [employer], PHARMD   1,250 mg at 11/17/11 1205  . vancomycin (VANCOCIN) 1,250 mg in sodium chloride 0.9 % 250 mL IVPB  1,250 mg Intravenous Q24H Benny Lennert, PHARMD   1,250 mg at 11/20/11 1000  . vancomycin (VANCOCIN) IVPB 1000 mg/200 mL premix  1,000 mg Intravenous Once Carlisle Beers Molpus, MD   1,000 mg at 11/17/11 0612  . DISCONTD: 0.45 % sodium chloride infusion   Intravenous Continuous Ravisankar R Avva, MD 50 mL/hr at 11/17/11 1104    . DISCONTD: amLODipine (NORVASC) tablet 10 mg  10 mg Oral Daily Ravisankar R Avva, MD   10 mg at 11/18/11 1036  . DISCONTD: amLODipine (NORVASC) tablet 10 mg  10 mg Oral Daily Gaspar Garbe, MD   10 mg at 11/19/11 1132  . DISCONTD: amLODipine-valsartan (EXFORGE) 10-320 MG per tablet 1 tablet  1 tablet Oral Daily Ravisankar R Avva, MD      . DISCONTD: amoxicillin-clavulanate (AUGMENTIN) 875-125 MG per tablet 1 tablet  1 tablet Oral Q12H Gaspar Garbe, MD   1 tablet at 11/18/11 2338  . DISCONTD: Calcium 1,500-3,000 mg  1,500-3,000 mg Oral BID Ravisankar R Avva, MD      . DISCONTD: enoxaparin (LOVENOX) injection 40 mg  40 mg Subcutaneous Q24H Ravisankar R Avva, MD   40 mg at 11/17/11 1621  . DISCONTD: glimepiride (AMARYL) tablet 2 mg  2 mg Oral BID AC Ravisankar R Avva, MD    2 mg at 11/20/11 0830  . DISCONTD: irbesartan (AVAPRO) tablet 300 mg  300 mg Oral Daily Ravisankar R Avva, MD   300 mg at 11/18/11  1029  . DISCONTD: linagliptin (TRADJENTA) tablet 5 mg  5 mg Oral Daily Ravisankar R Avva, MD   5 mg at 11/20/11 1010  . DISCONTD: metFORMIN (GLUCOPHAGE) tablet 1,000 mg  1,000 mg Oral BID WC Ravisankar R Avva, MD   1,000 mg at 11/18/11 1653  . DISCONTD: torsemide (DEMADEX) tablet 10 mg  10 mg Oral Daily Ravisankar R Avva, MD   10 mg at 11/18/11 1029  . DISCONTD: vancomycin (VANCOCIN) 1,250 mg in sodium chloride 0.9 % 250 mL IVPB  1,250 mg Intravenous Q12H [address] [place]   1,250 mg at 11/19/11 0028   No current outpatient prescriptions on file as of 11/20/2011.    Blood pressure 120/87, pulse 93, temperature 99 F (37.2 C), temperature source Oral, resp. rate 17, height 5\' 5"  (1.651 m), weight 301 lb (136.533 kg), SpO2 98.00%. Physical Exam: General: pleasant, obese black female who is laying in bed in NAD Heart: regular, rate, and rhythm.  Normal s1,s2. No obvious murmurs, gallops, or rubs noted.  Palpable radial and pedal pulses bilaterally Lungs: CTAB, no wheezes, rhonchi, or rales noted.  Respiratory effort nonlabored Skin: right groin reveals an area of induration and raw surface.  She does have some purulent drainage however specific site of drainage is unable to be found.  She does have some cellulitic changes noted tracking down her inner thigh, but significant induration just in groin right next to right labia.  Very tender.  No labial swelling or tenderness.  Does not extend to her rectum.  There is some edema compared to the left side. Psych: A&Ox3 with an appropriate affect.    Results for orders placed during the hospital encounter of 11/17/11 (from the past 48 hour(s))  GLUCOSE, CAPILLARY     Status: Abnormal   Collection Time   11/18/11  4:18 PM      Component Value Range Comment   Glucose-Capillary 191 (*) 70 - 99 (mg/dL)     GLUCOSE, CAPILLARY     Status: Abnormal   Collection Time   11/18/11  9:42 PM      Component Value Range Comment   Glucose-Capillary 256 (*) 70 - 99 (mg/dL)    Comment 1 Documented in Chart      Comment 2 Notify RN     CBC     Status: Abnormal   Collection Time   11/19/11  5:30 AM      Component Value Range Comment   WBC 11.3 (*) 4.0 - 10.5 (K/uL)    RBC 3.58 (*) 3.87 - 5.11 (MIL/uL)    Hemoglobin 10.1 (*) 12.0 - 15.0 (g/dL)    HCT 40.9 (*) 81.1 - 46.0 (%)    MCV 86.3  78.0 - 100.0 (fL)    MCH 28.2  26.0 - 34.0 (pg)    MCHC 32.7  30.0 - 36.0 (g/dL)    RDW 91.4  78.2 - 95.6 (%)    Platelets 213  150 - 400 (K/uL)   COMPREHENSIVE METABOLIC PANEL     Status: Abnormal   Collection Time   11/19/11  5:30 AM      Component Value Range Comment   Sodium 135  135 - 145 (mEq/L)    Potassium 4.6  3.5 - 5.1 (mEq/L)    Chloride 102  96 - 112 (mEq/L)    CO2 24  19 - 32 (mEq/L)    Glucose, Bld 133 (*) 70 - 99 (mg/dL)    BUN 35 (*) 6 - 23 (  mg/dL) DELTA CHECK NOTED   Creatinine, Ser 2.08 (*) 0.50 - 1.10 (mg/dL) DELTA CHECK NOTED   Calcium 10.3  8.4 - 10.5 (mg/dL)    Total Protein 6.6  6.0 - 8.3 (g/dL)    Albumin 2.5 (*) 3.5 - 5.2 (g/dL)    AST 13  0 - 37 (U/L)    ALT 8  0 - 35 (U/L)    Alkaline Phosphatase 69  39 - 117 (U/L)    Total Bilirubin 0.2 (*) 0.3 - 1.2 (mg/dL)    GFR calc non Af Amer 24 (*) >90 (mL/min)    GFR calc Af Amer 27 (*) >90 (mL/min)   GLUCOSE, CAPILLARY     Status: Abnormal   Collection Time   11/19/11  7:05 AM      Component Value Range Comment   Glucose-Capillary 111 (*) 70 - 99 (mg/dL)   GLUCOSE, CAPILLARY     Status: Abnormal   Collection Time   11/19/11 11:04 AM      Component Value Range Comment   Glucose-Capillary 107 (*) 70 - 99 (mg/dL)    Comment 1 Notify RN      Comment 2 Documented in Chart     VANCOMYCIN, RANDOM     Status: Normal   Collection Time   11/19/11 11:19 AM      Component Value Range Comment   Vancomycin Rm 32.6     BASIC METABOLIC PANEL      Status: Abnormal   Collection Time   11/19/11 11:19 AM      Component Value Range Comment   Sodium 136  135 - 145 (mEq/L)    Potassium 5.0  3.5 - 5.1 (mEq/L)    Chloride 102  96 - 112 (mEq/L)    CO2 24  19 - 32 (mEq/L)    Glucose, Bld 116 (*) 70 - 99 (mg/dL)    BUN 37 (*) 6 - 23 (mg/dL)    Creatinine, Ser 1.61 (*) 0.50 - 1.10 (mg/dL)    Calcium 09.6  8.4 - 10.5 (mg/dL)    GFR calc non Af Amer 20 (*) >90 (mL/min)    GFR calc Af Amer 23 (*) >90 (mL/min)   GLUCOSE, CAPILLARY     Status: Normal   Collection Time   11/19/11  4:34 PM      Component Value Range Comment   Glucose-Capillary 79  70 - 99 (mg/dL)    Comment 1 Documented in Chart      Comment 2 Notify RN     GLUCOSE, CAPILLARY     Status: Normal   Collection Time   11/19/11  5:08 PM      Component Value Range Comment   Glucose-Capillary 82  70 - 99 (mg/dL)   GLUCOSE, CAPILLARY     Status: Normal   Collection Time   11/19/11  6:38 PM      Component Value Range Comment   Glucose-Capillary 76  70 - 99 (mg/dL)    Comment 1 Notify RN      Comment 2 Documented in Chart     MRSA PCR SCREENING     Status: Normal   Collection Time   11/19/11  6:44 PM      Component Value Range Comment   MRSA by PCR NEGATIVE  NEGATIVE    BASIC METABOLIC PANEL     Status: Abnormal   Collection Time   11/19/11  9:21 PM      Component Value Range Comment   Sodium 132 (*)  135 - 145 (mEq/L)    Potassium 4.6  3.5 - 5.1 (mEq/L)    Chloride 101  96 - 112 (mEq/L)    CO2 20  19 - 32 (mEq/L)    Glucose, Bld 138 (*) 70 - 99 (mg/dL)    BUN 39 (*) 6 - 23 (mg/dL)    Creatinine, Ser 1.61 (*) 0.50 - 1.10 (mg/dL)    Calcium 09.6  8.4 - 10.5 (mg/dL)    GFR calc non Af Amer 18 (*) >90 (mL/min)    GFR calc Af Amer 21 (*) >90 (mL/min)   GLUCOSE, CAPILLARY     Status: Abnormal   Collection Time   11/19/11 10:22 PM      Component Value Range Comment   Glucose-Capillary 153 (*) 70 - 99 (mg/dL)    Comment 1 Notify RN      Comment 2 Documented in Chart     GLUCOSE,  CAPILLARY     Status: Abnormal   Collection Time   11/19/11 10:27 PM      Component Value Range Comment   Glucose-Capillary 178 (*) 70 - 99 (mg/dL)   URINALYSIS, ROUTINE W REFLEX MICROSCOPIC     Status: Abnormal   Collection Time   11/19/11 10:45 PM      Component Value Range Comment   Color, Urine AMBER (*) YELLOW  BIOCHEMICALS MAY BE AFFECTED BY COLOR   APPearance TURBID (*) CLEAR     Specific Gravity, Urine 1.028  1.005 - 1.030     pH 5.0  5.0 - 8.0     Glucose, UA NEGATIVE  NEGATIVE (mg/dL)    Hgb urine dipstick LARGE (*) NEGATIVE     Bilirubin Urine SMALL (*) NEGATIVE     Ketones, ur 15 (*) NEGATIVE (mg/dL)    Protein, ur 30 (*) NEGATIVE (mg/dL)    Urobilinogen, UA 1.0  0.0 - 1.0 (mg/dL)    Nitrite NEGATIVE  NEGATIVE     Leukocytes, UA MODERATE (*) NEGATIVE    URINE MICROSCOPIC-ADD ON     Status: Abnormal   Collection Time   11/19/11 10:45 PM      Component Value Range Comment   Squamous Epithelial / LPF RARE  RARE     WBC, UA 3-6  <3 (WBC/hpf)    RBC / HPF 11-20  <3 (RBC/hpf)    Bacteria, UA MANY (*) RARE    CBC     Status: Abnormal   Collection Time   11/20/11  5:25 AM      Component Value Range Comment   WBC 8.0  4.0 - 10.5 (K/uL)    RBC 3.10 (*) 3.87 - 5.11 (MIL/uL)    Hemoglobin 8.9 (*) 12.0 - 15.0 (g/dL)    HCT 04.5 (*) 40.9 - 46.0 (%)    MCV 87.7  78.0 - 100.0 (fL)    MCH 28.7  26.0 - 34.0 (pg)    MCHC 32.7  30.0 - 36.0 (g/dL)    RDW 81.1  91.4 - 78.2 (%)    Platelets 216  150 - 400 (K/uL)   COMPREHENSIVE METABOLIC PANEL     Status: Abnormal   Collection Time   11/20/11  5:25 AM      Component Value Range Comment   Sodium 135  135 - 145 (mEq/L)    Potassium 4.4  3.5 - 5.1 (mEq/L)    Chloride 104  96 - 112 (mEq/L)    CO2 23  19 - 32 (mEq/L)    Glucose, Bld 72  70 - 99 (mg/dL)    BUN 39 (*) 6 - 23 (mg/dL)    Creatinine, Ser 1.61 (*) 0.50 - 1.10 (mg/dL)    Calcium 09.6  8.4 - 10.5 (mg/dL)    Total Protein 6.3  6.0 - 8.3 (g/dL)    Albumin 2.5 (*) 3.5 - 5.2  (g/dL)    AST 20  0 - 37 (U/L)    ALT 10  0 - 35 (U/L)    Alkaline Phosphatase 64  39 - 117 (U/L)    Total Bilirubin 0.1 (*) 0.3 - 1.2 (mg/dL)    GFR calc non Af Amer 24 (*) >90 (mL/min)    GFR calc Af Amer 27 (*) >90 (mL/min)   VANCOMYCIN, RANDOM     Status: Normal   Collection Time   11/20/11  5:25 AM      Component Value Range Comment   Vancomycin Rm 21.7     GLUCOSE, CAPILLARY     Status: Abnormal   Collection Time   11/20/11  8:07 AM      Component Value Range Comment   Glucose-Capillary 48 (*) 70 - 99 (mg/dL)    Comment 1 Notify RN     GLUCOSE, CAPILLARY     Status: Normal   Collection Time   11/20/11  8:38 AM      Component Value Range Comment   Glucose-Capillary 83  70 - 99 (mg/dL)   GLUCOSE, CAPILLARY     Status: Abnormal   Collection Time   11/20/11 11:22 AM      Component Value Range Comment   Glucose-Capillary 157 (*) 70 - 99 (mg/dL)    Dg Chest Port 1 View  11/20/2011  *RADIOLOGY REPORT*  Clinical Data: Drop in blood pressure.  Sepsis.  PORTABLE CHEST - 1 VIEW  Comparison: 11/17/2011.  Findings: Cardiomegaly.  Central pulmonary vascular prominence/pulmonary edema.  Elevated right hemidiaphragm.  Limited for excluding infectious infiltrate.  Limited evaluation of mediastinal structures and aorta.  Gas filled prominent size stomach.  IMPRESSION: Cardiomegaly.  Central pulmonary vascular prominence/pulmonary edema.  Please see above.  Original Report Authenticated By: Fuller Canada, M.D.       Assessment/Plan 1. Right groin abscess, cellulitis 2. DM 3. HTN 4. Blindness  Plan: 1. Dr. Janee Morn and I have evaluated the patient together.  We agree the patient needs surgical incision and drainage in order to help this area get better.  We have discussed this with the patient who is agreeable to the I&D.  She has been made NPO and we will plan to take her to the OR shortly.  Thank you for this consultation.  Carrol Bondar E 11/20/2011, 1:09 PM

## 2011-11-20 NOTE — Anesthesia Preprocedure Evaluation (Addendum)
Anesthesia Evaluation  Patient identified by MRN, date of birth, ID band Patient awake    Reviewed: Allergy & Precautions, H&P , NPO status , Patient's Chart, lab work & pertinent test results  Airway Mallampati: II  Neck ROM: full    Dental   Pulmonary neg pulmonary ROS,  breath sounds clear to auscultation        Cardiovascular hypertension, Rhythm:Regular Rate:Normal     Neuro/Psych    GI/Hepatic Neg liver ROS, GERD-  ,  Endo/Other  Diabetes mellitus-Morbid obesity  Renal/GU ARFRenal disease     Musculoskeletal negative musculoskeletal ROS (+)   Abdominal   Peds  Hematology   Anesthesia Other Findings   Reproductive/Obstetrics                          Anesthesia Physical Anesthesia Plan  ASA: III  Anesthesia Plan: General   Post-op Pain Management:    Induction: Intravenous  Airway Management Planned: LMA  Additional Equipment:   Intra-op Plan:   Post-operative Plan:   Informed Consent: I have reviewed the patients History and Physical, chart, labs and discussed the procedure including the risks, benefits and alternatives for the proposed anesthesia with the patient or authorized representative who has indicated his/her understanding and acceptance.     Plan Discussed with: CRNA and Surgeon  Anesthesia Plan Comments:        Anesthesia Quick Evaluation

## 2011-11-20 NOTE — Anesthesia Postprocedure Evaluation (Signed)
  Anesthesia Post-op Note  Patient: [name]  Procedure(s) Performed: Procedure(s) (LRB): IRRIGATION AND DEBRIDEMENT PERIRECTAL ABSCESS (Right)  Patient Location: PACU  Anesthesia Type: General  Level of Consciousness: awake  Airway and Oxygen Therapy: Patient Spontanous Breathing  Post-op Pain: mild  Post-op Assessment: Post-op Vital signs reviewed  Post-op Vital Signs: Reviewed  Complications: No apparent anesthesia complications

## 2011-11-20 NOTE — Preoperative (Signed)
Beta Blockers   Reason not to administer Beta Blockers:Not Applicable 

## 2011-11-20 NOTE — Transfer of Care (Signed)
Immediate Anesthesia Transfer of Care Note  Patient: [name]  Procedure(s) Performed: Procedure(s) (LRB): IRRIGATION AND DEBRIDEMENT PERIRECTAL ABSCESS (Right)  Patient Location: PACU  Anesthesia Type: General  Level of Consciousness: awake, oriented and patient cooperative  Airway & Oxygen Therapy: Patient Spontanous Breathing and Patient connected to face mask oxygen  Post-op Assessment: Report given to PACU RN, Post -op Vital signs reviewed and stable and Patient moving all extremities X 4  Post vital signs: Reviewed and stable  Complications: No apparent anesthesia complications

## 2011-11-20 NOTE — Progress Notes (Signed)
ANTIBIOTIC CONSULT NOTE - FOLLOW UP  Pharmacy Consult for Vancomycin & Zosyn Indication: right groin cellulitis/abscess, sepsis coverage  No Known Allergies  Patient Measurements: Height: 5\' 5"  (165.1 cm) Weight: 301 lb (136.533 kg) IBW/kg (Calculated) : 57   Vital Signs: Temp: 99 F (37.2 C) (04/10 0400) Temp src: Oral (04/10 0400) BP: 112/50 mmHg (04/10 0700) Pulse Rate: 96  (04/10 0700) Intake/Output from previous day: 04/09 0701 - 04/10 0700 In: 7333.3 [P.O.:400; I.V.:6783.3; IV Piggyback:150] Out: 490 [Urine:490] Intake/Output from this shift:    Labs:  Basename 11/20/11 0525 11/19/11 2121 11/19/11 1119 11/19/11 0530 11/18/11 0545  WBC 8.0 -- -- 11.3* 10.4  HGB 8.9* -- -- 10.1* 10.6*  PLT 216 -- -- 213 199  LABCREA -- -- -- -- --  CREATININE 2.08* 2.56* 2.37* -- --   Estimated Creatinine Clearance: 36.8 ml/min (by C-G formula based on Cr of 2.08).  Basename 11/20/11 0525 11/19/11 1119  VANCOTROUGH -- --  Leodis Binet -- --  VANCORANDOM 21.7 32.6  GENTTROUGH -- --  GENTPEAK -- --  GENTRANDOM -- --  TOBRATROUGH -- --  TOBRAPEAK -- --  TOBRARND -- --  AMIKACINPEAK -- --  AMIKACINTROU -- --  AMIKACIN -- --      Assessment: 66 yo female with left groin cellulitis and sepsis (GPC 1/2 on 4/7) on vancomycin and zosyn. Patient also noted with acute renal failure and initial vancomycin level of 32.6 on vancomycin 1250mg  q12h and random today= 21.7. Estimated half life is about 30.6hrs.  Goal of Therapy:  Vancomycin trough= 15-20  Plan:  -Will change vancomycin to 1250mg  IV q24hr -Will follow renal function and cultures  Harland German, Pharm D 11/20/2011 8:01 AM

## 2011-11-20 NOTE — Interval H&P Note (Signed)
History and Physical Interval Note: Please refer to the consult note from my service today  11/20/2011 3:35 PM  [name]  has presented today for surgery, with the diagnosis of Right Groin Abscess  The various methods of treatment have been discussed with the patient and family. After consideration of risks, benefits and other options for treatment, the patient has consented to  Procedure(s) (LRB): IRRIGATION AND DEBRIDEMENT PERIRECTAL ABSCESS (N/A) as a surgical intervention .  The patients' history has been reviewed, patient examined, no change in status, stable for surgery.  I have reviewed the patients' chart and labs.  Questions were answered to the patient's satisfaction.     [name]

## 2011-11-20 NOTE — Op Note (Signed)
11/17/2011 - 11/20/2011  4:26 PM  PATIENT:  [name]  66 y.o. female  PRE-OPERATIVE DIAGNOSIS:  Right Groin Abscess  POST-OPERATIVE DIAGNOSIS:  Right Groin Abscess  PROCEDURE:  Procedure(s): IRRIGATION AND DEBRIDEMENT PERIRECTAL ABSCESS  SURGEON:  Surgeon(s): Liz Malady, MD  PHYSICIAN ASSISTANT:   ASSISTANTS: none   ANESTHESIA:   general  EBL:  Total I/O In: 840 [P.O.:190; I.V.:350; IV Piggyback:300] Out: 900 [Urine:900]  BLOOD ADMINISTERED:none  DRAINS: none   SPECIMEN:  Excision  DISPOSITION OF SPECIMEN:  PATHOLOGY  COUNTS:  YES  DICTATION: .Dragon Dictation patient was admitted and has been treated for right groin cellulitis. This has worsened despite antibiotic treatment and she is formed a more localized abscess. She also had some element of sepsis today. We saw her in consultation today. She is brought for emergent incision and drainage of right groin abscess. The patient was identified in the preop holding area. Her site was marked. She is receiving intravenous antibiotics. She was brought to the operative room and general endotracheal anesthesia was administered by the anesthesia staff. She was placed in lithotomy position. Her right thigh and bilateral groin areas were prepped and draped in sterile fashion. We did time out procedure. There is a punctate area of frank pus draining from high in the right groin crease lateral to the labia. This purulence was sent for culture. There was a surrounding area of the amount 9 cm of the induration. This area was widely excised including all underlying skin changes. Subcutaneous fat and the fascia was viable. Specimen was sent to pathology. Area was copiously irrigated. Hemostasis was obtained. All loculations were broken up. After good hemostasis, the wound was packed with saline soaked Kerlix. A sterile dressing was applied. all counts were correct patient tolerated procedure well without apparent complications taken  recovery in stable condition.  PATIENT DISPOSITION:  PACU - hemodynamically stable.   Delay start of Pharmacological VTE agent (>24hrs) due to surgical blood loss or risk of bleeding:  no  [name], MD, MPH, FACS Pager: [phone]  4/10/20134:26 PM

## 2011-11-20 NOTE — Progress Notes (Signed)
Subjective: Developed worsening signs of sepsis yesterday afternoon.  Initially BP meds held except amlodipine earlier in the day due to renal failure (Cr 1->2)  Required fluid resuscitation and was transferred to the ICU due to acuity.  I discussed all of this with the patient's family who was present this AM.  She is doing a lot better, BP acceptable, Cr levelling off, UOP picking up.  2nd set of cultures drawn, awaiting initial culture results.  Initial U/A and culture were negative.  Post cath U/A is dirty.  Objective: Vital signs in last 24 hours: Temp:  [97.2 F (36.2 C)-102.6 F (39.2 C)] 99 F (37.2 C) (04/10 0700) Pulse Rate:  [93-124] 96  (04/10 0700) Resp:  [10-20] 16  (04/10 0700) BP: (82-139)/(50-119) 112/50 mmHg (04/10 0700) SpO2:  [89 %-98 %] 97 % (04/10 0700) FiO2 (%):  [4 %] 4 % (04/09 1845) Weight change:  Last BM Date: 11/16/11  Intake/Output from previous day: 04/09 0701 - 04/10 0700 In: 7333.3 [P.O.:400; I.V.:6783.3; IV Piggyback:150] Out: 490 [Urine:490] Intake/Output this shift:   General appearance: alert, cooperative and appears stated age  Neck: no adenopathy, no carotid bruit, no JVD, supple, symmetrical, trachea midline and thyroid not enlarged, symmetric, no tenderness/mass/nodules  Resp: clear to auscultation bilaterally  Cardio: regular rate and rhythm, S1, S2 normal, no murmur, click, rub or gallop  GI: soft, non-tender; bowel sounds normal; no masses, no organomegaly  Extremities: extremities normal, atraumatic, no cyanosis, trace edema  Skin: Skin color, texture, turgor normal. No rashes or lesions  Lymph nodes: Inguinal adenopathy: R groin nodes positive      Lab Results:  Basename 11/20/11 0525 11/19/11 0530  WBC 8.0 11.3*  HGB 8.9* 10.1*  HCT 27.2* 30.9*  PLT 216 213   BMET  Basename 11/20/11 0525 11/19/11 2121  NA 135 132*  K 4.4 4.6  CL 104 101  CO2 23 20  GLUCOSE 72 138*  BUN 39* 39*  CREATININE 2.08* 2.56*  CALCIUM 10.1  10.1    Studies/Results: Dg Chest Port 1 View  11/20/2011  *RADIOLOGY REPORT*  Clinical Data: Drop in blood pressure.  Sepsis.  PORTABLE CHEST - 1 VIEW  Comparison: 11/17/2011.  Findings: Cardiomegaly.  Central pulmonary vascular prominence/pulmonary edema.  Elevated right hemidiaphragm.  Limited for excluding infectious infiltrate.  Limited evaluation of mediastinal structures and aorta.  Gas filled prominent size stomach.  IMPRESSION: Cardiomegaly.  Central pulmonary vascular prominence/pulmonary edema.  Please see above.  Original Report Authenticated By: Fuller Canada, M.D.    Medications:  I have reviewed the patient's current medications. Scheduled:   . aspirin EC  81 mg Oral Daily  . calcium carbonate  2 tablet Oral Daily   And  . calcium carbonate  1 tablet Oral QPM  . cholecalciferol  1,000 Units Oral Daily  . cilostazol  100 mg Oral Daily  . docusate sodium  100 mg Oral BID  . enoxaparin (LOVENOX) injection  70 mg Subcutaneous Q24H  . gabapentin  300 mg Oral QHS  . glimepiride  2 mg Oral BID AC  . insulin aspart  0-15 Units Subcutaneous TID WC  . insulin aspart  0-5 Units Subcutaneous QHS  . insulin glargine  40 Units Subcutaneous QHS  . linagliptin  5 mg Oral Daily  . oxyCODONE  10 mg Oral Q12H  . pantoprazole  40 mg Oral Q1200  . piperacillin-tazobactam (ZOSYN)  IV  3.375 g Intravenous Q8H  . prednisoLONE acetate  1 drop Both Eyes BID  .  sodium chloride  1,000 mL Intravenous Once  . vancomycin  1,250 mg Intravenous Q24H  . DISCONTD: amLODipine  10 mg Oral Daily   Continuous:   . sodium chloride 250 mL/hr at 11/20/11 0030  . sodium chloride 50 mL/hr at 11/20/11 4098   JXB:[id number], acetaminophen, docusate sodium, fentaNYL, HYDROcodone-acetaminophen, morphine, polyethylene glycol  Assessment/Plan: 1) Gram negative sepsis? Gram positive as well.  Initial call from nurse as documented indicates gram negatives, nothing has grown as of now.  On broad spectrum  abx continuing. from Groin abscess- boil. Wound care consult.on Vancomycin and Zosyn . Will await ID/Sensitivities. Nidus of infection actually looks better and her WBC is down to normal this AM. 2) DM2 controlled, SSI. Her A1C was 7.6, which is good for her. We have concerns with hypos given her lack of vision. Held Metformin due to increase in Creatinine  3) HTN Controlled, was hypotensive yesterday.  BP meds held another day. 4) Hyperlipidemia- Cont meds.  5) Added OxyContin 10mg  bid standing for pain control. Has tolerated this in the past when she broke her shoulder. Hopefully will limit PRN use.  6) Acute Renal failure. Likely due to sepsis. Leveling off with IVF boluses, now backed off some due to ? Fluid overload on CXR.  Clinically satting well and lungs clear. 7)  Hypotension- Resolved as above. Continue to await cultures and if remains stable next 24 hours will move back to the floor.   LOS: 3 days   Anjelo Pullman W 11/20/2011, 8:14 AM

## 2011-11-21 ENCOUNTER — Inpatient Hospital Stay (HOSPITAL_COMMUNITY): Payer: Medicare HMO

## 2011-11-21 ENCOUNTER — Encounter (HOSPITAL_COMMUNITY): Payer: Self-pay | Admitting: General Surgery

## 2011-11-21 LAB — COMPREHENSIVE METABOLIC PANEL
Alkaline Phosphatase: 56 U/L (ref 39–117)
BUN: 21 mg/dL (ref 6–23)
Calcium: 9.9 mg/dL (ref 8.4–10.5)
GFR calc Af Amer: 54 mL/min — ABNORMAL LOW (ref >90)
Glucose, Bld: 56 mg/dL — ABNORMAL LOW (ref 70–99)
Total Protein: 6 g/dL (ref 6.0–8.3)

## 2011-11-21 LAB — CBC
HCT: 27.6 % — ABNORMAL LOW (ref 36.0–46.0)
Hemoglobin: 8.9 g/dL — ABNORMAL LOW (ref 12.0–15.0)
MCH: 28.2 pg (ref 26.0–34.0)
MCHC: 32.2 g/dL (ref 30.0–36.0)

## 2011-11-21 LAB — GLUCOSE, CAPILLARY
Glucose-Capillary: 95 mg/dL (ref 70–99)
Glucose-Capillary: 115 mg/dL — ABNORMAL HIGH (ref 70–99)
Glucose-Capillary: 203 mg/dL — ABNORMAL HIGH (ref 70–99)

## 2011-11-21 LAB — PRO B NATRIURETIC PEPTIDE: Pro B Natriuretic peptide (BNP): 293 pg/mL — ABNORMAL HIGH (ref 0–125)

## 2011-11-21 LAB — DIFFERENTIAL
Basophils Absolute: 0 10*3/uL (ref 0.0–0.1)
Eosinophils Absolute: 0.2 10*3/uL (ref 0.0–0.7)
Lymphocytes Relative: 19 % (ref 12–46)
Lymphs Abs: 1.2 10*3/uL (ref 0.7–4.0)
Neutrophils Relative %: 72 % (ref 43–77)

## 2011-11-21 MED ORDER — INSULIN GLARGINE 100 UNIT/ML ~~LOC~~ SOLN
30.0000 [IU] | Freq: Every day | SUBCUTANEOUS | Status: DC
  Administered 2011-11-21 – 2011-11-24 (×4): 30 [IU] via SUBCUTANEOUS

## 2011-11-21 NOTE — Progress Notes (Signed)
1 Day Post-Op  Subjective: No groin pain, hemodynamically stable overnight   Objective: Vital signs in last 24 hours: Temp:  [97.2 F (36.2 C)-100.4 F (38 C)] 99 F (37.2 C) (04/11 0755) Pulse Rate:  [90-105] 91  (04/11 0800) Resp:  [11-25] 19  (04/11 0800) BP: (104-159)/(40-87) 141/72 mmHg (04/11 0800) SpO2:  [92 %-98 %] 97 % (04/11 0800) Last BM Date: 11/17/11  Intake/Output from previous day: 04/10 0701 - 04/11 0700 In: 1740 [P.O.:190; I.V.:1250; IV Piggyback:300] Out: 3290 [Urine:3245; Blood:45] Intake/Output this shift: Total I/O In: 150 [I.V.:100; IV Piggyback:50] Out: -   General appearance: alert and cooperative Abdomen soft, NT Wound is clean without ongoing surrounding cellulitis  Lab Results:   Basename 11/21/11 0400 11/20/11 0525  WBC 6.3 8.0  HGB 8.9* 8.9*  HCT 27.6* 27.2*  PLT 231 216   BMET  Basename 11/21/11 0400 11/20/11 0525  NA 138 135  K 5.0 4.4  CL 107 104  CO2 24 23  GLUCOSE 56* 72  BUN 21 39*  CREATININE 1.19* 2.08*  CALCIUM 9.9 10.1   PT/INR No results found for this basename: LABPROT:2,INR:2 in the last 72 hours ABG No results found for this basename: PHART:2,PCO2:2,PO2:2,HCO3:2 in the last 72 hours  Studies/Results: Dg Chest Port 1 View  11/21/2011  *RADIOLOGY REPORT*  Clinical Data: Evaluate endotracheal tube placement  PORTABLE CHEST - 1 VIEW  Comparison: Portable chest x-ray of 11/19/2011  Findings: No endotracheal tube is visible.  The lungs appear slightly better aerated although aeration remains suboptimal. Moderate cardiomegaly is present and there does appear to be pulmonary vascular congestion present.  No bony abnormality is seen.  IMPRESSION: No endotracheal tube is visible.  Slightly improved aeration. Cardiomegaly and pulmonary vascular congestion remain.  Original Report Authenticated By: Juline Patch, M.D.   Dg Chest Port 1 View  11/20/2011  *RADIOLOGY REPORT*  Clinical Data: Drop in blood pressure.  Sepsis.   PORTABLE CHEST - 1 VIEW  Comparison: 11/17/2011.  Findings: Cardiomegaly.  Central pulmonary vascular prominence/pulmonary edema.  Elevated right hemidiaphragm.  Limited for excluding infectious infiltrate.  Limited evaluation of mediastinal structures and aorta.  Gas filled prominent size stomach.  IMPRESSION: Cardiomegaly.  Central pulmonary vascular prominence/pulmonary edema.  Please see above.  Original Report Authenticated By: Fuller Canada, M.D.    Anti-infectives: Anti-infectives     Start     Dose/Rate Route Frequency Ordered Stop   11/20/11 0900   vancomycin (VANCOCIN) 1,250 mg in sodium chloride 0.9 % 250 mL IVPB        1,250 mg 166.7 mL/hr over 90 Minutes Intravenous Every 24 hours 11/20/11 0804     11/19/11 0800   piperacillin-tazobactam (ZOSYN) IVPB 3.375 g        3.375 g 12.5 mL/hr over 240 Minutes Intravenous Every 8 hours 11/19/11 0731     11/18/11 1000   amoxicillin-clavulanate (AUGMENTIN) 875-125 MG per tablet 1 tablet  Status:  Discontinued        1 tablet Oral Every 12 hours 11/18/11 0803 11/19/11 0715   11/17/11 2330   vancomycin (VANCOCIN) 1,250 mg in sodium chloride 0.9 % 250 mL IVPB  Status:  Discontinued        1,250 mg 166.7 mL/hr over 90 Minutes Intravenous Every 12 hours 11/17/11 1122 11/19/11 0731   11/17/11 1130   vancomycin (VANCOCIN) 1,250 mg in sodium chloride 0.9 % 250 mL IVPB        1,250 mg 166.7 mL/hr over 90 Minutes Intravenous  Once 11/17/11 1122 11/17/11 1335   11/17/11 0500   vancomycin (VANCOCIN) IVPB 1000 mg/200 mL premix        1,000 mg 200 mL/hr over 60 Minutes Intravenous  Once 11/17/11 0456 11/17/11 1610          Assessment/Plan: s/p Procedure(s) (LRB): S/P I&D right groin abscess Start BID wet to dry Continue IV ABX I D/W Dr. Tyson Alias from [facility]   LOS: 4 days    [name] 11/21/2011

## 2011-11-21 NOTE — Progress Notes (Signed)
Subjective: Daughter here again today.  Mom is "starving" because she was NPO pre and post I+D yesterday.  Has an order for diet this AM per routine.  Also very focused on her hearing, which is a chronic issue.  Tried to discuss why she is here and review course.  Culture from admission grew likely skin flora contaminant as was expected, therefore had I+D yesterday.  Objective: Vital signs in last 24 hours: Temp:  [97.2 F (36.2 C)-100.4 F (38 C)] 99 F (37.2 C) (04/11 0755) Pulse Rate:  [90-108] 105  (04/11 0600) Resp:  [11-25] 25  (04/11 0600) BP: (61-159)/(40-87) 139/62 mmHg (04/11 0600) SpO2:  [92 %-98 %] 98 % (04/11 0600) Weight change:  Last BM Date: 11/17/11  Intake/Output from previous day: 04/10 0701 - 04/11 0700 In: 1740 [P.O.:190; I.V.:1250; IV Piggyback:300] Out: 3290 [Urine:3245; Blood:45] Intake/Output this shift:   General appearance: alert, cooperative and appears stated age  ENT  No cerumen impaction on otoscopic eval Neck: no adenopathy, no carotid bruit, no JVD, supple, symmetrical, trachea midline and thyroid not enlarged, symmetric, no tenderness/mass/nodules  Resp: clear to auscultation bilaterally  Cardio: regular rate and rhythm, S1, S2 normal, no murmur, click, rub or gallop  GI: soft, non-tender; bowel sounds normal; no masses, no organomegaly  Extremities: extremities normal, atraumatic, no cyanosis, trace edema  Skin: Groin covered and dressed appropriately post I+D.   Lab Results:  Basename 11/21/11 0400 11/20/11 0525  WBC 6.3 8.0  HGB 8.9* 8.9*  HCT 27.6* 27.2*  PLT 231 216   BMET  Basename 11/21/11 0400 11/20/11 0525  NA 138 135  K 5.0 4.4  CL 107 104  CO2 24 23  GLUCOSE 56* 72  BUN 21 39*  CREATININE 1.19* 2.08*  CALCIUM 9.9 10.1    Studies/Results: Dg Chest Port 1 View  11/20/2011  *RADIOLOGY REPORT*  Clinical Data: Drop in blood pressure.  Sepsis.  PORTABLE CHEST - 1 VIEW  Comparison: 11/17/2011.  Findings: Cardiomegaly.   Central pulmonary vascular prominence/pulmonary edema.  Elevated right hemidiaphragm.  Limited for excluding infectious infiltrate.  Limited evaluation of mediastinal structures and aorta.  Gas filled prominent size stomach.  IMPRESSION: Cardiomegaly.  Central pulmonary vascular prominence/pulmonary edema.  Please see above.  Original Report Authenticated By: Fuller Canada, M.D.    Medications:  I have reviewed the patient's current medications. Scheduled:   . aspirin EC  81 mg Oral Daily  . calcium carbonate  2 tablet Oral Daily   And  . calcium carbonate  1 tablet Oral QPM  . cholecalciferol  1,000 Units Oral Daily  . cilostazol  100 mg Oral Daily  . docusate sodium  100 mg Oral BID  . enoxaparin (LOVENOX) injection  70 mg Subcutaneous Q24H  . gabapentin  300 mg Oral QHS  . insulin aspart  0-15 Units Subcutaneous TID WC  . insulin aspart  0-5 Units Subcutaneous QHS  . insulin glargine  30 Units Subcutaneous QHS  . oxyCODONE  10 mg Oral Q12H  . pantoprazole  40 mg Oral Q1200  . piperacillin-tazobactam (ZOSYN)  IV  3.375 g Intravenous Q8H  . prednisoLONE acetate  1 drop Both Eyes BID  . vancomycin  1,250 mg Intravenous Q24H  . DISCONTD: amLODipine  10 mg Oral Daily  . DISCONTD: enoxaparin (LOVENOX) injection  70 mg Subcutaneous Q24H  . DISCONTD: glimepiride  2 mg Oral BID AC  . DISCONTD: insulin glargine  40 Units Subcutaneous QHS  . DISCONTD: linagliptin  5 mg  Oral Daily   Continuous:   . sodium chloride 50 mL/hr at 11/21/11 0500  . DISCONTD: sodium chloride    . DISCONTD: lactated ringers     ZOX:[id number], acetaminophen, docusate sodium, fentaNYL, HYDROcodone-acetaminophen, morphine, polyethylene glycol, DISCONTD: 0.9 % irrigation (POUR BTL), DISCONTD: HYDROmorphone, DISCONTD: ondansetron (ZOFRAN) IV  Assessment/Plan: 1) Sepsis, now resolved with source likely her groin.  I+D performed by surgery yesterday.  COntinue broad spectrum coverage. 2) DM2 controlled, Not  resuming metformin yet.  Lowered her Lantus dose, but will likely need to increase as diet and renal function improve. 3) HTN Controlled, was hypotensive yesterday. BP meds held.  Will move back to floor today.4) Hyperlipidemia- Cont meds.  4) Pain controlled at present 5) Acute Renal failure.  Resolved with fluid resuscitation 6) BNP mildly elevated, but no overt signs of fluid overload.  Diuretics will likely need to be restarted soon, possibly tomorrow. 7) Hypotension- Resolved as above.  Continue to await cultures.  Will move back to Med/Surg floor.  Working on daughter to focus on current, not chronic medical issues.  Patient's son usually is the one bringing her to the office.  LOS: 4 days   Girard Koontz W 11/21/2011, 7:57 AM

## 2011-11-21 NOTE — Progress Notes (Signed)
Name: [name] MRN: [id number] DOB: April 09, 1946  LOS: 4  CRITICAL CARE follow up  History of Present Illness: This is a pleasant 66 y/o female with DM2 who was admitted on 4/7 with R groin pain.  A bedside ultrasound performed by the EDP did not reveal a frank pocket of pus.  She was treated with augmentin initially but her blood cultures became positive in the evening of 4/8.  On 4/9 she became more hypotensive and oliguric despite fluid boluses so she was transferred to the ICU for further monitoring.  She notes that her groin is feeling better.  Lines / Drains:   Cultures / Sepsis markers: 4/7 Blood culture >> positive for GPC in 1/2>>>coag neg staph 4/7 urine culture >> ngtd BCX2 4/9>>> 4/10 abcess culture>>>  Antibiotics: 4/7 vanc >> 4/9 zosyn >> 4/7 augmentin >> 4/9  Tests / Events: 4/10- no pressors required    Vital Signs:   Filed Vitals:   11/21/11 0500 11/21/11 0600 11/21/11 0755 11/21/11 0800  BP: 136/61 139/62  141/72  Pulse: 100 105  91  Temp:  99.2 F (37.3 C) 99 F (37.2 C)   TempSrc:  Oral Oral   Resp: 13 25  19   Height:      Weight:      SpO2: 94% 98%  97%    General: no distress Neuro: nonfocal HEENT: obese, jvd wnl PULM: reduced CV: s1 s2 RRR no m, distant GI: soft, BS wnl, no r/g Extremities: no edema Groin: rt  Dressed and intact   Labs and Imaging:    Lab 11/21/11 0400 11/20/11 0525 11/19/11 2121  NA 138 135 132*  K 5.0 4.4 4.6  CL 107 104 101  CO2 24 23 20   BUN 21 39* 39*  CREATININE 1.19* 2.08* 2.56*  GLUCOSE 56* 72 138*    Lab 11/21/11 0400 11/20/11 0525 11/19/11 0530  HGB 8.9* 8.9* 10.1*  HCT 27.6* 27.2* 30.9*  WBC 6.3 8.0 11.3*  PLT 231 216 213       Assessment and Plan:  66 y/o female with poorly controlled diabetes presented on 11/17/2011 with cellulitis of the right groin.  She became hypotensive with low urine output and a rising WBC count on 4/9 and was transferred to 3100 for further management.  After  receiving a one liter bolus here her BP and HR improved markedly.  It does appear that she has sepsis, although paradoxically her skin infection appears to be improving (less tender, less swollen per her).  DDx of infectious etiology is either an infection not covered by vanc/augmentin and now improving with zosyn vs. a second new infection.    Abcess of right groin (11/17/2011)  Assessment: ddx includes MRSA vs. Polymicrobial based on diabetes. S/P I&D on 4/10 Plan:  continue polymicrobial coverage Wound care as directed by surgery likely can narrow abx after further review cultures from OR Appreciate Surgery drainage   Acute renal failure (11/19/2011)   Assessment: oliguric/prerenal due to sepsis / ATN, now much improved.  Recent Labs  Basename 11/21/11 0400 11/20/11 0525 11/19/11 2121   CREATININE 1.19* 2.08* 2.56*   Plan:  -follow UOP closely -repeat BMET Treat abscess imrpved int prom on pcxr, bnp not impressive  Type II or unspecified type diabetes mellitus with unspecified complication, uncontrolled (11/17/2011)  CBG (last 3)   Basename 11/21/11 0754 11/20/11 2137 11/20/11 1825  GLUCAP 95 82 88  Assessment: well controlled in hospital   Plan: -per # 1 service  Agree  w/ transfer to medical ward. We will s/o.   Call if needed \\Krystianna Soth  Daneil Dan, MD, FACP Pgr: [phone] [facility]

## 2011-11-21 NOTE — Progress Notes (Signed)
1730 Patient arrived to unit from 3100 via bed. Alert oriented x4, Dressing clean and dry to right groin. Pt is nontelemetry. Patient is legally blind. Family at bedside.

## 2011-11-22 LAB — COMPREHENSIVE METABOLIC PANEL
ALT: 12 U/L (ref 0–35)
Alkaline Phosphatase: 58 U/L (ref 39–117)
BUN: 14 mg/dL (ref 6–23)
CO2: 27 mEq/L (ref 19–32)
Chloride: 106 mEq/L (ref 96–112)
GFR calc Af Amer: 64 mL/min — ABNORMAL LOW (ref >90)
GFR calc non Af Amer: 55 mL/min — ABNORMAL LOW (ref >90)
Glucose, Bld: 121 mg/dL — ABNORMAL HIGH (ref 70–99)
Potassium: 5.2 mEq/L — ABNORMAL HIGH (ref 3.5–5.1)
Sodium: 139 mEq/L (ref 135–145)
Total Bilirubin: 0.2 mg/dL — ABNORMAL LOW (ref 0.3–1.2)
Total Protein: 6.6 g/dL (ref 6.0–8.3)

## 2011-11-22 LAB — CBC
HCT: 28.8 % — ABNORMAL LOW (ref 36.0–46.0)
Hemoglobin: 9.3 g/dL — ABNORMAL LOW (ref 12.0–15.0)
RBC: 3.28 MIL/uL — ABNORMAL LOW (ref 3.87–5.11)
WBC: 5.5 10*3/uL (ref 4.0–10.5)

## 2011-11-22 LAB — GLUCOSE, CAPILLARY
Glucose-Capillary: 167 mg/dL — ABNORMAL HIGH (ref 70–99)
Glucose-Capillary: 220 mg/dL — ABNORMAL HIGH (ref 70–99)

## 2011-11-22 MED ORDER — AMLODIPINE BESYLATE 10 MG PO TABS
10.0000 mg | ORAL_TABLET | Freq: Every day | ORAL | Status: DC
  Administered 2011-11-22 – 2011-11-25 (×4): 10 mg via ORAL
  Filled 2011-11-22 (×5): qty 1

## 2011-11-22 MED ORDER — IRBESARTAN 300 MG PO TABS
300.0000 mg | ORAL_TABLET | Freq: Every day | ORAL | Status: DC
  Administered 2011-11-22 – 2011-11-25 (×4): 300 mg via ORAL
  Filled 2011-11-22 (×4): qty 1

## 2011-11-22 NOTE — Plan of Care (Signed)
Problem: Phase I Progression Outcomes Goal: Initial discharge plan identified Outcome: Completed/Met Date Met:  11/22/11 To return home with H/H

## 2011-11-22 NOTE — Progress Notes (Signed)
Subjective: Already ate her breakfast.  Feels well.  Daughter in room.  Uneventful 24 hours  Objective: Vital signs in last 24 hours: Temp:  [98.4 F (36.9 C)-99.2 F (37.3 C)] 98.7 F (37.1 C) (04/12 0606) Pulse Rate:  [90-105] 90  (04/12 0606) Resp:  [19-24] 20  (04/12 0606) BP: (80-175)/(62-91) 154/79 mmHg (04/12 0606) SpO2:  [92 %-99 %] 98 % (04/12 0606) Weight change:  Last BM Date: 11/17/11  Intake/Output from previous day: 04/11 0701 - 04/12 0700 In: 850 [I.V.:500; IV Piggyback:350] Out: 705 [Urine:705] Intake/Output this shift:   General appearance: alert, cooperative and appears stated age  ENT No cerumen impaction on otoscopic eval  Neck: no adenopathy, no carotid bruit, no JVD, supple, symmetrical, trachea midline and thyroid not enlarged, symmetric, no tenderness/mass/nodules  Resp: clear to auscultation bilaterally  Cardio: regular rate and rhythm, S1, S2 normal, no murmur, click, rub or gallop  GI: soft, non-tender; bowel sounds normal; no masses, no organomegaly  Extremities: extremities normal, atraumatic, no cyanosis, trace edema  Skin: Groin covered and dressed appropriately post I+D.     Lab Results:  Basename 11/22/11 0555 11/21/11 0400  WBC 5.5 6.3  HGB 9.3* 8.9*  HCT 28.8* 27.6*  PLT 276 231   BMET  Basename 11/22/11 0555 11/21/11 0400  NA 139 138  K 5.2* 5.0  CL 106 107  CO2 27 24  GLUCOSE 121* 56*  BUN 14 21  CREATININE 1.03 1.19*  CALCIUM 10.7* 9.9    Studies/Results: Dg Chest Port 1 View  11/21/2011  *RADIOLOGY REPORT*  Clinical Data: Evaluate endotracheal tube placement  PORTABLE CHEST - 1 VIEW  Comparison: Portable chest x-ray of 11/19/2011  Findings: No endotracheal tube is visible.  The lungs appear slightly better aerated although aeration remains suboptimal. Moderate cardiomegaly is present and there does appear to be pulmonary vascular congestion present.  No bony abnormality is seen.  IMPRESSION: No endotracheal tube is  visible.  Slightly improved aeration. Cardiomegaly and pulmonary vascular congestion remain.  Original Report Authenticated By: Juline Patch, M.D.    Medications:  I have reviewed the patient's current medications. Scheduled:   . amLODipine  10 mg Oral Daily  . aspirin EC  81 mg Oral Daily  . calcium carbonate  2 tablet Oral Daily   And  . calcium carbonate  1 tablet Oral QPM  . cholecalciferol  1,000 Units Oral Daily  . cilostazol  100 mg Oral Daily  . docusate sodium  100 mg Oral BID  . enoxaparin (LOVENOX) injection  70 mg Subcutaneous Q24H  . gabapentin  300 mg Oral QHS  . insulin aspart  0-15 Units Subcutaneous TID WC  . insulin aspart  0-5 Units Subcutaneous QHS  . insulin glargine  30 Units Subcutaneous QHS  . oxyCODONE  10 mg Oral Q12H  . pantoprazole  40 mg Oral Q1200  . piperacillin-tazobactam (ZOSYN)  IV  3.375 g Intravenous Q8H  . prednisoLONE acetate  1 drop Both Eyes BID  . vancomycin  1,250 mg Intravenous Q24H  . DISCONTD: insulin glargine  40 Units Subcutaneous QHS   Continuous:   . sodium chloride 50 mL/hr at 11/21/11 0500   JXB:[id number], acetaminophen, docusate sodium, fentaNYL, HYDROcodone-acetaminophen, morphine, polyethylene glycol  Assessment/Plan: 1) Sepsis, now resolved with source likely her groin. I+D performed by surgery 2 days ago. Continue broad spectrum coverage.  2) DM2 controlled, Not resuming metformin yet. Lowered her Lantus dose, but will likely need to increase as diet and renal function  improve. CBG ok this AM 3) HTN Controlled, Resuming amlodipine.  May need diuretic/ ARB resumed in next couple of days if BP continues to go up. 4) Pain controlled at present  5) Acute Renal failure. Resolved with fluid resuscitation  6) BNP mildly elevated, but no overt signs of fluid overload. Diuretics will likely need to be restarted soon, possibly tomorrow.  7) Hypotension- Resolved as above.  Continue to await cultures. Will get up in chair and  consult social work for home health needs.  Will refine abx over weekend depending on cultures and convert to PO if possible.  Likely home on Monday if does well through the weekend.  LOS: 5 days   [name] 11/22/2011, 7:43 AM

## 2011-11-22 NOTE — Progress Notes (Signed)
Patient examined and I agree with the assessment and plan  [name], MD, MPH, FACS Pager: [phone]  11/22/2011 3:42 PM

## 2011-11-22 NOTE — Progress Notes (Signed)
Clinical Social Worker received consult request home health aide. This is an inappropriate consult. RN case manager involved with HH needs. CSW signing off, please advise if SW services needed prior to discharge.  Genelle Bal, MSW, LCSW [phone]

## 2011-11-22 NOTE — Progress Notes (Signed)
   CARE MANAGEMENT NOTE 11/22/2011  Patient:  [name], [name]   Account Number:  [id number]  Date Initiated:  11/22/2011  Documentation initiated by:  Ronny Flurry  Subjective/Objective Assessment:   DX: sepsis , cellulitis of left groin    11-20-11 IRRIGATION AND DEBRIDEMENT PERIRECTAL ABSCESS     Action/Plan:   Referral for [facility] for w-d dressing training for daughter in law. Met with pt who selected [facility] for [facility] services.   Anticipated DC Date:  11/25/2011   Anticipated DC Plan:  HOME W HOME HEALTH SERVICES         [facility] Choice  HOME HEALTH   Choice offered to / List presented to:  C-1 Patient        HH arranged  HH-1 RN      [facility] agency  [facility]   Status of service:  Completed, signed off Medicare Important Message given?   (If response is "NO", the following Medicare IM given date fields will be blank) Date Medicare IM given:   Date Additional Medicare IM given:    Discharge Disposition:  HOME W HOME HEALTH SERVICES  Per UR Regulation:  Reviewed for med. necessity/level of care/duration of stay  If discussed at Long Length of Stay Meetings, dates discussed:    Comments:  11/22/2010 Met with pt who selected [facility] for [facility] w-d dressing changes to be performed by pt daughter in law. Will follow for any other d/c needs. Await eval of pt mobility. Johny Shock RN MPH, Case manager, [phone]  11-20-11  MD plan :  Continue to await cultures.   Will refine abx over weekend depending on cultures and convert to PO if possible.  Likely home on Monday if does well through the weekend.  Ronny Flurry RN BSN

## 2011-11-22 NOTE — Progress Notes (Signed)
Patient ID: [name], female   DOB: 1946/04/07, 66 y.o.   MRN: [id number] 2 Days Post-Op  Subjective: Pt feeling better.  Only pain is with tape removal.  Objective: Vital signs in last 24 hours: Temp:  [98.4 F (36.9 C)-99.2 F (37.3 C)] 98.7 F (37.1 C) (04/12 0606) Pulse Rate:  [90-105] 90  (04/12 0606) Resp:  [19-24] 20  (04/12 0606) BP: (80-175)/(62-91) 154/79 mmHg (04/12 0606) SpO2:  [92 %-99 %] 98 % (04/12 0606) Last BM Date: 11/17/11  Intake/Output from previous day: 04/11 0701 - 04/12 0700 In: 850 [I.V.:500; IV Piggyback:350] Out: 705 [Urine:705] Intake/Output this shift:    PE: Skin: wound is clean and packed.  Cellulitic changes have almost completely resolved.  Much less edema.  Lab Results:   Basename 11/22/11 0555 11/21/11 0400  WBC 5.5 6.3  HGB 9.3* 8.9*  HCT 28.8* 27.6*  PLT 276 231   BMET  Basename 11/22/11 0555 11/21/11 0400  NA 139 138  K 5.2* 5.0  CL 106 107  CO2 27 24  GLUCOSE 121* 56*  BUN 14 21  CREATININE 1.03 1.19*  CALCIUM 10.7* 9.9   PT/INR No results found for this basename: LABPROT:2,INR:2 in the last 72 hours CMP     Component Value Date/Time   NA 139 11/22/2011 0555   K 5.2* 11/22/2011 0555   CL 106 11/22/2011 0555   CO2 27 11/22/2011 0555   GLUCOSE 121* 11/22/2011 0555   BUN 14 11/22/2011 0555   CREATININE 1.03 11/22/2011 0555   CALCIUM 10.7* 11/22/2011 0555   PROT 6.6 11/22/2011 0555   ALBUMIN 2.6* 11/22/2011 0555   AST 26 11/22/2011 0555   ALT 12 11/22/2011 0555   ALKPHOS 58 11/22/2011 0555   BILITOT 0.2* 11/22/2011 0555   GFRNONAA 55* 11/22/2011 0555   GFRAA 64* 11/22/2011 0555   Lipase  No results found for this basename: lipase       Studies/Results: Dg Chest Port 1 View  11/21/2011  *RADIOLOGY REPORT*  Clinical Data: Evaluate endotracheal tube placement  PORTABLE CHEST - 1 VIEW  Comparison: Portable chest x-ray of 11/19/2011  Findings: No endotracheal tube is visible.  The lungs appear slightly better aerated  although aeration remains suboptimal. Moderate cardiomegaly is present and there does appear to be pulmonary vascular congestion present.  No bony abnormality is seen.  IMPRESSION: No endotracheal tube is visible.  Slightly improved aeration. Cardiomegaly and pulmonary vascular congestion remain.  Original Report Authenticated By: Juline Patch, M.D.    Anti-infectives: Anti-infectives     Start     Dose/Rate Route Frequency Ordered Stop   11/20/11 0900   vancomycin (VANCOCIN) 1,250 mg in sodium chloride 0.9 % 250 mL IVPB        1,250 mg 166.7 mL/hr over 90 Minutes Intravenous Every 24 hours 11/20/11 0804     11/19/11 0800  piperacillin-tazobactam (ZOSYN) IVPB 3.375 g       3.375 g 12.5 mL/hr over 240 Minutes Intravenous Every 8 hours 11/19/11 0731     11/18/11 1000   amoxicillin-clavulanate (AUGMENTIN) 875-125 MG per tablet 1 tablet  Status:  Discontinued        1 tablet Oral Every 12 hours 11/18/11 0803 11/19/11 0715   11/17/11 2330   vancomycin (VANCOCIN) 1,250 mg in sodium chloride 0.9 % 250 mL IVPB  Status:  Discontinued        1,250 mg 166.7 mL/hr over 90 Minutes Intravenous Every 12 hours 11/17/11 1122 11/19/11 0731  11/17/11 1130   vancomycin (VANCOCIN) 1,250 mg in sodium chloride 0.9 % 250 mL IVPB        1,250 mg 166.7 mL/hr over 90 Minutes Intravenous  Once 11/17/11 1122 11/17/11 1335   11/17/11 0500   vancomycin (VANCOCIN) IVPB 1000 mg/200 mL premix        1,000 mg 200 mL/hr over 60 Minutes Intravenous  Once 11/17/11 0456 11/17/11 0712           Assessment/Plan  1. Right groin abscess/cellulitis, s/p I&D  Plan: 1. Cont local wound care.  Will arrange [facility] RN for NS WD dressing changes at home.  Daughter in law willing to be taught how to do dressing changes at home. 2. CX from OR are pending.  Right now showing no organism growth.   LOS: 5 days    Cypress Hinkson E 11/22/2011

## 2011-11-23 LAB — COMPREHENSIVE METABOLIC PANEL
ALT: 18 U/L (ref 0–35)
Alkaline Phosphatase: 67 U/L (ref 39–117)
BUN: 9 mg/dL (ref 6–23)
CO2: 27 mEq/L (ref 19–32)
Calcium: 10.8 mg/dL — ABNORMAL HIGH (ref 8.4–10.5)
GFR calc Af Amer: 83 mL/min — ABNORMAL LOW (ref >90)
GFR calc non Af Amer: 71 mL/min — ABNORMAL LOW (ref >90)
Glucose, Bld: 252 mg/dL — ABNORMAL HIGH (ref 70–99)
Sodium: 139 mEq/L (ref 135–145)

## 2011-11-23 LAB — GLUCOSE, CAPILLARY
Glucose-Capillary: 254 mg/dL — ABNORMAL HIGH (ref 70–99)
Glucose-Capillary: 172 mg/dL — ABNORMAL HIGH (ref 70–99)
Glucose-Capillary: 230 mg/dL — ABNORMAL HIGH (ref 70–99)
Glucose-Capillary: 256 mg/dL — ABNORMAL HIGH (ref 70–99)

## 2011-11-23 LAB — CBC
HCT: 28.9 % — ABNORMAL LOW (ref 36.0–46.0)
Hemoglobin: 9.5 g/dL — ABNORMAL LOW (ref 12.0–15.0)
MCH: 28.7 pg (ref 26.0–34.0)
MCHC: 32.9 g/dL (ref 30.0–36.0)
RBC: 3.31 MIL/uL — ABNORMAL LOW (ref 3.87–5.11)

## 2011-11-23 LAB — CULTURE, BLOOD (ROUTINE X 2)

## 2011-11-23 LAB — VANCOMYCIN, TROUGH: Vancomycin Tr: 24.7 ug/mL — ABNORMAL HIGH (ref 10.0–20.0)

## 2011-11-23 MED ORDER — WHITE PETROLATUM GEL
Status: AC
  Administered 2011-11-23: 13:00:00
  Filled 2011-11-23: qty 5

## 2011-11-23 NOTE — Progress Notes (Signed)
Subjective: She is not having significant left groin pain, is eating well, is not having dyspnea.  Objective: Vital signs in last 24 hours: Temp:  [98.5 F (36.9 C)-99 F (37.2 C)] 99 F (37.2 C) (04/13 0500) Pulse Rate:  [74-105] 105  (04/13 0500) Resp:  [18-20] 20  (04/13 0500) BP: (161-182)/(73-91) 182/91 mmHg (04/13 0500) SpO2:  [95 %-96 %] 95 % (04/13 0500) Weight change:    Intake/Output from previous day: 04/12 0701 - 04/13 0700 In: 0  Out: 1200 [Urine:1200]   General appearance: alert, cooperative and no distress Resp: clear to auscultation bilaterally Cardio: regular rate and rhythm, S1, S2 normal, no murmur, click, rub or gallop GI: soft, non-tender; bowel sounds normal; no masses,  no organomegaly Extremities: bilateral 1+ leg edema  Lab Results:  Basename 11/23/11 0957 11/22/11 0555  WBC 5.5 5.5  HGB 9.5* 9.3*  HCT 28.9* 28.8*  PLT 309 276   BMET  Basename 11/23/11 0957 11/22/11 0555  NA 139 139  K 4.7 5.2*  CL 105 106  CO2 27 27  GLUCOSE 252* 121*  BUN 9 14  CREATININE 0.83 1.03  CALCIUM 10.8* 10.7*   CMET CMP     Component Value Date/Time   NA 139 11/23/2011 0957   K 4.7 11/23/2011 0957   CL 105 11/23/2011 0957   CO2 27 11/23/2011 0957   GLUCOSE 252* 11/23/2011 0957   BUN 9 11/23/2011 0957   CREATININE 0.83 11/23/2011 0957   CALCIUM 10.8* 11/23/2011 0957   PROT 6.4 11/23/2011 0957   ALBUMIN 2.6* 11/23/2011 0957   AST 26 11/23/2011 0957   ALT 18 11/23/2011 0957   ALKPHOS 67 11/23/2011 0957   BILITOT 0.1* 11/23/2011 0957   GFRNONAA 71* 11/23/2011 0957   GFRAA 83* 11/23/2011 0957    CBG (last 3)   Basename 11/23/11 0752 11/22/11 2132 11/22/11 1747  GLUCAP 254* 183* 220*    INR RESULTS:   No results found for this basename: INR, PROTIME     Studies/Results: No results found.  Medications: I have reviewed the patient's current medications.  Assessment/Plan: #1 Cellulitis:  Much improved after I&D and antibiotic rx. Will likely be ready  for discharge in 1-2 days. Will see what surgeons opinion regarding duration of IV antibiotic treatment. #2 DM2:  Stable on meds. #3 Sepsis Syndrome:  Improved #4 Hypercalcemia:  Unclear cause, and we will check ionized calcium level and intact PTH level tomorrow.  LOS: 6 days   Lillard Bailon G 11/23/2011, 12:26 PM

## 2011-11-23 NOTE — Progress Notes (Signed)
ANTIBIOTIC/Anitcoagulation  CONSULT NOTE - FOLLOW UP  Pharmacy Consult for Vancomycin & Zosyn; Lovenox VTE prophylaxis Indication: right groin cellulitis/abscess, sepsis coverage, VTE prophylaxis  No Known Allergies  Patient Measurements: Height: 5\' 5"  (165.1 cm) Weight: 301 lb (136.533 kg) IBW/kg (Calculated) : 57  BMI = 50.2  Vital Signs: Temp: 99 F (37.2 C) (04/13 0500) Temp src: Oral (04/13 0500) BP: 182/91 mmHg (04/13 0500) Pulse Rate: 105  (04/13 0500) Intake/Output from previous day: 04/12 0701 - 04/13 0700 In: 0  Out: 1200 [Urine:1200] Intake/Output from this shift: Total I/O In: 360 [P.O.:360] Out: -   Labs:  Basename 11/23/11 0957 11/22/11 0555 11/21/11 0400  WBC 5.5 5.5 6.3  HGB 9.5* 9.3* 8.9*  PLT 309 276 231  LABCREA -- -- --  CREATININE 0.83 1.03 1.19*   Estimated Creatinine Clearance: 92.2 ml/min (by C-G formula based on Cr of 0.83).  Basename 11/23/11 0957  VANCOTROUGH 24.7*  VANCOPEAK --  Drue Dun --  GENTTROUGH --  GENTPEAK --  GENTRANDOM --  TOBRATROUGH --  TOBRAPEAK --  TOBRARND --  AMIKACINPEAK --  AMIKACINTROU --  AMIKACIN --      Assessment: 66 yo female with left groin cellulitis and sepsis (GPC 1/2 on 4/7) on vancomycin and zosyn. On 11/19/11 patient with acute renal failure and initial vancomycin level of 32.6 on vancomycin 1250mg  q12h, then random on 4/10 was 21.7. Vancomycin resumed at lower dose of 1250mg  q24h on 4/10.  Today Vanc trough = 24.7 mcg/ml but this level drawn after Vanc dose given at 09:01 (level drawn @ 0957); confirmed with RN.  Thus level is not a accurate trough. Goal for vancomycin trough is 15-20 mcg/ml and level today 24.7 while the dose was infusing suggest that level was most likely appropriately = or <43mcg/ml prior to giving today's dose.   Blood cultures 1 of 2  (4/7) negative; final Blood cx 1 of 2 (4/7) Coag neg staph (?contaminant) Abscess rt groin cx (4/10) No growth to date.  Blood CX x2  4/9   NGTD   Lovenox 70mg  sq q24h continues for VTE prophylaxis in obese patient (BMI =50). Scr improved 0.83, CrCl ~ 40ml/min,  H/H 9.5/28.9,  pltc 309K. No bleeding reported.  Goal of Therapy:  Vancomycin trough= 15-20 Lovenox goal= VTE prophylaxis  Plan:  -Contiune Vancomycin to 1250mg  IV q24hr. Recheck vancomycin trough in few days if continued.  No change in Zosyn dose.  -Will follow renal function and cultures Continue Lovenox 70mg  SQ q24h  Noah Delaine, RPh 11/23/2011 12:15 PM

## 2011-11-23 NOTE — Plan of Care (Signed)
Problem: Phase II Progression Outcomes Goal: Progress activity as tolerated unless otherwise ordered Outcome: Completed/Met Date Met:  11/23/11 Pt OOB to chair as tolerated Goal: Discharge plan established Outcome: Completed/Met Date Met:  11/23/11 Return home with family and HH when medically cleared

## 2011-11-23 NOTE — Evaluation (Signed)
Physical Therapy Evaluation Patient Details Name: [name] MRN: [id number] DOB: 09-30-1945 Today's Date: 11/23/2011  Problem List:  Patient Active Problem List  Diagnoses  . PVD  . Cellulitis of left groin  . Legal blindness, as defined in [place]  . Type II or unspecified type diabetes mellitus with unspecified complication, uncontrolled  . Essential hypertension, benign  . Obesity, unspecified  . Osteoarthrosis, unspecified whether generalized or localized, shoulder region  . Esophageal reflux  . Sepsis  . Acute renal failure  . Hypercalcemia    Past Medical History:  Past Medical History  Diagnosis Date  . Hypertension   . Diabetes mellitus   . Blind    Past Surgical History:  Past Surgical History  Procedure Date  . Incision and drainage perirectal abscess 11/20/2011    Procedure: IRRIGATION AND DEBRIDEMENT PERIRECTAL ABSCESS;  Surgeon: Liz Malady, MD;  [facility]: [facility];  Service: General;  Laterality: Right;  Incision and Drainage of Right Groin Abscess    PT Assessment/Plan/Recommendation PT Assessment Clinical Impression Statement: 66 year old female admitted with cellulitis, s/p I&D. Pt presents with decreased strength and very limited endurance, further impaired by near blindness. Pt currently requires moderate assist with transfers from chair, her son and daughter-in-law are gone ~10 hrs a day for work. With 4' ambulation pt's HR elevated to 130bpm, then 136bpm with second 4' ambulation. Feel pt will benefit from ST-SNF to increase endurance, strength, and balance to promote eventual safe return home and decrease risk for falls.  PT Recommendation/Assessment: Patient will need skilled PT in the acute care venue PT Problem List: Decreased strength;Decreased activity tolerance;Decreased balance;Decreased mobility;Decreased knowledge of use of DME;Cardiopulmonary status limiting activity Barriers to Discharge: Decreased caregiver support PT Therapy Diagnosis :  Difficulty walking;Generalized weakness;Abnormality of gait PT Plan PT Frequency: Min 3X/week PT Treatment/Interventions: DME instruction;Gait training;Functional mobility training;Therapeutic activities;Therapeutic exercise;Balance training;Patient/family education PT Recommendation Follow Up Recommendations: Skilled nursing facility;Supervision for mobility/OOB Equipment Recommended: Defer to next venue PT Goals  Acute Rehab PT Goals PT Goal Formulation: With patient Time For Goal Achievement: 2 weeks Pt will Roll Supine to Right Side: with modified independence PT Goal: Rolling Supine to Right Side - Progress: Goal set today Pt will Roll Supine to Left Side: with modified independence PT Goal: Rolling Supine to Left Side - Progress: Goal set today Pt will go Supine/Side to Sit: with modified independence PT Goal: Supine/Side to Sit - Progress: Goal set today Pt will go Sit to Supine/Side: with modified independence PT Goal: Sit to Supine/Side - Progress: Goal set today Pt will go Sit to Stand: with modified independence PT Goal: Sit to Stand - Progress: Goal set today Pt will go Stand to Sit: with modified independence PT Goal: Stand to Sit - Progress: Goal set today Pt will Ambulate: 16 - 50 feet;with least restrictive assistive device;with modified independence;Other (comment) (without rest breaks) PT Goal: Ambulate - Progress: Goal set today Pt will Perform Home Exercise Program: Independently PT Goal: Perform Home Exercise Program - Progress: Goal set today  PT Evaluation Precautions/Restrictions  Precautions Precautions: Fall Prior Functioning  Home Living Lives With: Son (and daughter-in-law) Available Help at Discharge: Family Type of Home: House Home Access: Level entry (one small step up) Home Layout: One level Bathroom Shower/Tub: Health visitor: Handicapped height Bathroom Accessibility: Yes How Accessible: Accessible via walker Home Adaptive  Equipment: Built-in shower seat Prior Function Level of Independence: Independent (with mobility, short distances in house) Needs Assistance: Light Housekeeping Light Housekeeping:  Total Able to Take Stairs?: No Driving: No Comments: Pt legally blind, reports she is unable to see anything. Reports she is able to furniture and wall "cruise" to find her way around her house. Son and daughter-in-law work ~10 hours a day, pt with decreased supervision at home.  Cognition Cognition Arousal/Alertness: Awake/alert Overall Cognitive Status: Appears within functional limits for tasks assessed Orientation Level: Oriented to person;Oriented to place;Oriented to situation Cognition - Other Comments: Slow to process at times Sensation/Coordination Sensation Light Touch: Appears Intact (per pt report) Stereognosis: Not tested Hot/Cold: Not tested Proprioception: Not tested Coordination Gross Motor Movements are Fluid and Coordinated: Yes (although limited by body habitus) Fine Motor Movements are Fluid and Coordinated: Yes Extremity Assessment RLE Assessment RLE Assessment: Exceptions to [employer] RLE AROM (degrees) RLE Overall AROM Comments: Limited by body habitus, unable to fully assess.  RLE Strength RLE Overall Strength: Deficits RLE Overall Strength Comments: Generalized deconditioning, grossly >3+/5 LLE Assessment LLE Assessment: Exceptions to WFL LLE AROM (degrees) LLE Overall AROM Comments: Limited by body habitus, unable to fully assess. LLE Strength LLE Overall Strength: Deficits LLE Overall Strength Comments: Generalized deconditioning, grossly >3+/5 Mobility (including Balance) Bed Mobility Bed Mobility: Yes Rolling Left: 4: Min assist Rolling Left Details (indicate cue type and reason): Assist to facilitate reaching for railing with Rt. UE. Verbal cues for sequence and efficiency Left Sidelying to Sit: 4: Min assist Left Sidelying to Sit Details (indicate cue type and reason):  Very difficult for pt secondary to weakness and body habitus. Assist for LEs.  Sit to Supine: 4: Min assist Sit to Supine - Details (indicate cue type and reason): Assist for bil. LEs. Pt very fatigued by transition - unefficient movement noted.  Transfers Transfers: Yes Sit to Stand: 4: Min assist;From bed;From chair/3-in-1;3: Mod assist Sit to Stand Details (indicate cue type and reason): min assist from bed x 2 trials, moderate assist from chair with facilitation of anterior translation of trunk into standing (post failed first attempt from chair). Pt winded and fatigued with each standing.  Stand to Sit: 3: Mod assist;To chair/3-in-1;To bed Stand to Sit Details: moderate assist to control descent pt tends to "plop." Verbal cues and tactile cues (secondary to blindness) for UE placement.  Ambulation/Gait Ambulation/Gait: Yes Ambulation/Gait Assistance: 4: Min assist Ambulation/Gait Assistance Details (indicate cue type and reason): min assist for stability, cues for deep breathing. Pt with very limited ambulation secondary to fatigue and elevated HR. Pt requesting seated rest secondary to "feeling weak." Verbal cues for appropriate use of RW.  Ambulation Distance (Feet): 8 Feet (Total 4' then 4' with seated rest (~61min) to recover) Assistive device: Rolling walker Gait Pattern: Step-to pattern (wide base of support, minimal foot clearance bilaterally)  Posture/Postural Control Posture/Postural Control: No significant limitations Balance Balance Assessed: Yes Static Sitting Balance Static Sitting - Balance Support: Bilateral upper extremity supported;Feet supported Static Sitting - Level of Assistance: 6: Modified independent (Device/Increase time) Static Standing Balance Static Standing - Balance Support: Bilateral upper extremity supported Static Standing - Level of Assistance: Other (comment) (min-guard assist) End of Session PT - End of Session Equipment Utilized During Treatment:  Gait belt Activity Tolerance: Treatment limited secondary to medical complications (Comment);Patient limited by fatigue (elevated HR) Patient left: in bed;with call bell in reach Nurse Communication: Mobility status for ambulation;Mobility status for transfers;Other (comment) (cardiopulmonary status) General Behavior During Session: [facility] for tasks performed Cognition: [facility] for tasks performed  Wilhemina Bonito 11/23/2011, 5:22 PM  Dahlia Client (Cheek) Carleene Mains PT, DPT Acute  Rehabilitation [phone]

## 2011-11-23 NOTE — Progress Notes (Signed)
3 Days Post-Op  Subjective: Patient sitting in the chair.  States that the wound feels much better.   Objective: Vital signs in last 24 hours: Temp:  [98.5 F (36.9 C)-99 F (37.2 C)] 99 F (37.2 C) (04/13 0500) Pulse Rate:  [74-105] 105  (04/13 0500) Resp:  [18-20] 20  (04/13 0500) BP: (151-182)/(73-91) 182/91 mmHg (04/13 0500) SpO2:  [95 %-96 %] 95 % (04/13 0500) Last BM Date:  (PTA)  Intake/Output from previous day: 04/12 0701 - 04/13 0700 In: 0  Out: 1200 [Urine:1200] Intake/Output this shift:    Very difficult to examine with the patient sitting up due to her body habitus.  The parts of the wound that I could visualize appeared to be clean and beginning to granulate.  No surrounding cellulitis  Lab Results:   Basename 11/22/11 0555 11/21/11 0400  WBC 5.5 6.3  HGB 9.3* 8.9*  HCT 28.8* 27.6*  PLT 276 231   BMET  Basename 11/22/11 0555 11/21/11 0400  NA 139 138  K 5.2* 5.0  CL 106 107  CO2 27 24  GLUCOSE 121* 56*  BUN 14 21  CREATININE 1.03 1.19*  CALCIUM 10.7* 9.9   PT/INR No results found for this basename: LABPROT:2,INR:2 in the last 72 hours ABG No results found for this basename: PHART:2,PCO2:2,PO2:2,HCO3:2 in the last 72 hours  Studies/Results: No results found.  Anti-infectives: Anti-infectives     Start     Dose/Rate Route Frequency Ordered Stop   11/20/11 0900   vancomycin (VANCOCIN) 1,250 mg in sodium chloride 0.9 % 250 mL IVPB        1,250 mg 166.7 mL/hr over 90 Minutes Intravenous Every 24 hours 11/20/11 0804     11/19/11 0800   piperacillin-tazobactam (ZOSYN) IVPB 3.375 g        3.375 g 12.5 mL/hr over 240 Minutes Intravenous Every 8 hours 11/19/11 0731     11/18/11 1000   amoxicillin-clavulanate (AUGMENTIN) 875-125 MG per tablet 1 tablet  Status:  Discontinued        1 tablet Oral Every 12 hours 11/18/11 0803 11/19/11 0715   11/17/11 2330   vancomycin (VANCOCIN) 1,250 mg in sodium chloride 0.9 % 250 mL IVPB  Status:  Discontinued        1,250 mg 166.7 mL/hr over 90 Minutes Intravenous Every 12 hours 11/17/11 1122 11/19/11 0731   11/17/11 1130   vancomycin (VANCOCIN) 1,250 mg in sodium chloride 0.9 % 250 mL IVPB        1,250 mg 166.7 mL/hr over 90 Minutes Intravenous  Once 11/17/11 1122 11/17/11 1335   11/17/11 0500   vancomycin (VANCOCIN) IVPB 1000 mg/200 mL premix        1,000 mg 200 mL/hr over 60 Minutes Intravenous  Once 11/17/11 0456 11/17/11 0454          Assessment/Plan: s/p Procedure(s) (LRB): IRRIGATION AND DEBRIDEMENT PERIRECTAL ABSCESS (Right) Continue dressing changes - plan for [facility], discharge per primary team  LOS: 6 days    Catherine Grima K. 11/23/2011

## 2011-11-24 LAB — GLUCOSE, CAPILLARY
Glucose-Capillary: 159 mg/dL — ABNORMAL HIGH (ref 70–99)
Glucose-Capillary: 190 mg/dL — ABNORMAL HIGH (ref 70–99)
Glucose-Capillary: 209 mg/dL — ABNORMAL HIGH (ref 70–99)

## 2011-11-24 LAB — CULTURE, ROUTINE-ABSCESS: Gram Stain: NONE SEEN

## 2011-11-24 LAB — COMPREHENSIVE METABOLIC PANEL
ALT: 19 U/L (ref 0–35)
AST: 24 U/L (ref 0–37)
Alkaline Phosphatase: 57 U/L (ref 39–117)
Calcium: 10.4 mg/dL (ref 8.4–10.5)
Glucose, Bld: 187 mg/dL — ABNORMAL HIGH (ref 70–99)
Potassium: 4.4 mEq/L (ref 3.5–5.1)
Sodium: 142 mEq/L (ref 135–145)
Total Protein: 6 g/dL (ref 6.0–8.3)

## 2011-11-24 LAB — CBC
Hemoglobin: 8.7 g/dL — ABNORMAL LOW (ref 12.0–15.0)
MCH: 27.8 pg (ref 26.0–34.0)
MCHC: 32.2 g/dL (ref 30.0–36.0)
Platelets: 300 10*3/uL (ref 150–400)
RBC: 3.13 MIL/uL — ABNORMAL LOW (ref 3.87–5.11)

## 2011-11-24 NOTE — Progress Notes (Signed)
Subjective: Has only minimal left groin discomfort, is eating well, not having any diarrhea.  Objective: Vital signs in last 24 hours: Temp:  [98.3 F (36.8 C)-98.7 F (37.1 C)] 98.3 F (36.8 C) (04/14 0422) Pulse Rate:  [89-136] 89  (04/14 0923) Resp:  [18-20] 20  (04/14 0422) BP: (126-161)/(68-79) 126/78 mmHg (04/14 0923) SpO2:  [88 %-97 %] 96 % (04/14 0422) Weight change:    Intake/Output from previous day: 04/13 0701 - 04/14 0700 In: 1540 [P.O.:960; I.V.:580] Out: 825 [Urine:825]   General appearance: alert, cooperative and no distress Resp: clear to auscultation bilaterally Cardio: regular rate and rhythm, S1, S2 normal, no murmur, click, rub or gallop GI: soft, non-tender; bowel sounds normal; no masses,  no organomegaly  Lab Results:  Basename 11/24/11 0600 11/23/11 0957  WBC 5.8 5.5  HGB 8.7* 9.5*  HCT 27.0* 28.9*  PLT 300 309   BMET  Basename 11/24/11 0600 11/23/11 0957  NA 142 139  K 4.4 4.7  CL 108 105  CO2 28 27  GLUCOSE 187* 252*  BUN 10 9  CREATININE 0.92 0.83  CALCIUM 10.4 10.8*   CMET CMP     Component Value Date/Time   NA 142 11/24/2011 0600   K 4.4 11/24/2011 0600   CL 108 11/24/2011 0600   CO2 28 11/24/2011 0600   GLUCOSE 187* 11/24/2011 0600   BUN 10 11/24/2011 0600   CREATININE 0.92 11/24/2011 0600   CALCIUM 10.4 11/24/2011 0600   PROT 6.0 11/24/2011 0600   ALBUMIN 2.4* 11/24/2011 0600   AST 24 11/24/2011 0600   ALT 19 11/24/2011 0600   ALKPHOS 57 11/24/2011 0600   BILITOT 0.1* 11/24/2011 0600   GFRNONAA 63* 11/24/2011 0600   GFRAA 73* 11/24/2011 0600    CBG (last 3)   Basename 11/24/11 1223 11/24/11 0751 11/23/11 2144  GLUCAP 190* 159* 256*    INR RESULTS:   No results found for this basename: INR, PROTIME     Studies/Results: No results found.  Medications: I have reviewed the patient's current medications.  Assessment/Plan: #1 Left Groin Abscess:  Doing well with likely discharge to home in the morning. #2 Diabetes  Mellitus, type 2:  Stable on current meds. #3 Anemia:  stable  LOS: 7 days   Quintasia Theroux G 11/24/2011, 2:45 PM

## 2011-11-24 NOTE — Progress Notes (Signed)
Patient ID: [name], female   DOB: 1945-12-12, 66 y.o.   MRN: [id number] Cultures remain negative. Can change to PO antibiotics (Cipro, Levaquin, or Keflex) and arrange for home health nursing.  Daily wet to dry dressing changes. Follow-up with Dr. Janee Morn in 2-3 weeks  Wilmon Arms. Corliss Skains, MD, [facility]  11/24/2011 8:41 AM

## 2011-11-24 NOTE — Plan of Care (Signed)
Problem: Phase III Progression Outcomes Goal: Activity at appropriate level-compared to baseline (UP IN CHAIR FOR HEMODIALYSIS)  Outcome: Completed/Met Date Met:  11/24/11 Pt oob to chair with one assist as tolerated. Goal: Discharge plan remains appropriate-arrangements made Outcome: Completed/Met Date Met:  11/24/11 Pt to discharge home with [facility] 4/15

## 2011-11-25 LAB — COMPREHENSIVE METABOLIC PANEL
Albumin: 2.6 g/dL — ABNORMAL LOW (ref 3.5–5.2)
BUN: 8 mg/dL (ref 6–23)
Calcium: 10.4 mg/dL (ref 8.4–10.5)
Chloride: 103 mEq/L (ref 96–112)
Creatinine, Ser: 0.9 mg/dL (ref 0.50–1.10)
Total Bilirubin: 0.1 mg/dL — ABNORMAL LOW (ref 0.3–1.2)

## 2011-11-25 LAB — ANAEROBIC CULTURE

## 2011-11-25 LAB — CBC
HCT: 28.4 % — ABNORMAL LOW (ref 36.0–46.0)
MCH: 28.8 pg (ref 26.0–34.0)
MCHC: 32.7 g/dL (ref 30.0–36.0)
MCV: 87.9 fL (ref 78.0–100.0)
Platelets: 322 10*3/uL (ref 150–400)
RDW: 15 % (ref 11.5–15.5)
WBC: 5.9 10*3/uL (ref 4.0–10.5)

## 2011-11-25 LAB — GLUCOSE, CAPILLARY

## 2011-11-25 LAB — PTH, INTACT AND CALCIUM: Calcium, Total (PTH): 9.9 mg/dL (ref 8.4–10.5)

## 2011-11-25 MED ORDER — HYDROCODONE-ACETAMINOPHEN 5-325 MG PO TABS: 1.0000 | ORAL_TABLET | ORAL | Status: AC | PRN

## 2011-11-25 MED ORDER — LEVOFLOXACIN 500 MG PO TABS: 500.0000 mg | ORAL_TABLET | Freq: Every day | ORAL | Status: AC

## 2011-11-25 MED ORDER — LEVOFLOXACIN 500 MG PO TABS
500.0000 mg | ORAL_TABLET | Freq: Every day | ORAL | Status: DC
  Administered 2011-11-25: 500 mg via ORAL
  Filled 2011-11-25: qty 1

## 2011-11-25 NOTE — Discharge Instructions (Signed)
Abscess An abscess (boil or furuncle) is an infected area under your skin. This area is filled with yellowish white fluid (pus). HOME CARE   Only take medicine as told by your doctor.   Keep the skin clean around your abscess. Keep clothes that may touch the abscess clean.   Change any bandages (dressings) as told by your doctor.   Avoid direct skin contact with other people. The infection can spread by skin contact with others.   Practice good hygiene and do not share personal care items.   Do not share athletic equipment, towels, or whirlpools. Shower after every practice or work out session.   If a draining area cannot be covered:   Do not play sports.   Children should not go to daycare until the wound has healed or until fluid (drainage) stops coming out of the wound.   See your doctor for a follow-up visit as told.  GET HELP RIGHT AWAY IF:   There is more pain, puffiness (swelling), and redness in the wound site.   There is fluid or bleeding from the wound site.   You have muscle aches, chills, fever, or feel sick.   You or your child has a temperature by mouth above 102 F (38.9 C), not controlled by medicine.   Your baby is older than 3 months with a rectal temperature of 102 F (38.9 C) or higher.  MAKE SURE YOU:   Understand these instructions.   Will watch your condition.   Will get help right away if you are not doing well or get worse.  Document Released: 01/15/2008 Document Revised: 07/18/2011 Document Reviewed: 01/15/2008 ExitCare Patient Information 2012 [employer]. 

## 2011-11-25 NOTE — Evaluation (Signed)
Occupational Therapy Evaluation Patient Details Name: [name] MRN: [id number] DOB: 1946-07-20 Today's Date: 11/25/2011  Problem List:  Patient Active Problem List  Diagnoses  . PVD  . Cellulitis of left groin  . Legal blindness, as defined in [place]  . Type II or unspecified type diabetes mellitus with unspecified complication, uncontrolled  . Essential hypertension, benign  . Obesity, unspecified  . Osteoarthrosis, unspecified whether generalized or localized, shoulder region  . Esophageal reflux  . Sepsis  . Acute renal failure  . Hypercalcemia    Past Medical History:  Past Medical History  Diagnosis Date  . Hypertension   . Diabetes mellitus   . Blind    Past Surgical History:  Past Surgical History  Procedure Date  . Incision and drainage perirectal abscess 11/20/2011    Procedure: IRRIGATION AND DEBRIDEMENT PERIRECTAL ABSCESS;  Surgeon: Liz Malady, MD;  Location: [facility] OR;  Service: General;  Laterality: Right;  Incision and Drainage of Right Groin Abscess    OT Assessment/Plan/Recommendation OT Assessment Clinical Impression Statement: Pleasant 66yr old femal admitted with left groin abscess.  Admitted for treatment and drainage.  Pt also with impaired vision and morbid obesity all impacting pt's ability to perform all selfcare tasks as independent as possible.  Feel pt will benefit from [facility] to further work on ADL independence and help educate pt on AE that will assist with LB selfcare.  Per pt she will have 24 hour assist at discharge.  No further acute OT needs since pt is discharging as soon as her ride gets here. OT Recommendation/Assessment: All further OT needs can be met in the next venue of care OT Problem List: Decreased strength;Impaired balance (sitting and/or standing);Decreased knowledge of use of DME or AE OT Recommendation Follow Up Recommendations: Home health OT Equipment Recommended: None recommended by OT Individuals Consulted Consulted  and Agree with Results and Recommendations: Patient OT Goals    OT Evaluation Precautions/Restrictions  Precautions Precautions: Fall Restrictions Weight Bearing Restrictions: No Prior Functioning Home Living Home Adaptive Equipment: Raised toilet seat with rails Prior Function Needs Assistance: Dressing Dressing: Minimal  ADL ADL Eating/Feeding: Simulated;Set up Where Assessed - Eating/Feeding: Chair Grooming: Performed;Wash/dry hands;Minimal assistance Where Assessed - Grooming: Standing at sink Upper Body Bathing: Simulated;Minimal assistance Where Assessed - Upper Body Bathing: Sitting, chair;Unsupported Lower Body Bathing: Simulated;Moderate assistance Where Assessed - Lower Body Bathing: Sit to stand from chair Upper Body Dressing: Simulated;Moderate assistance Where Assessed - Upper Body Dressing: Sit to stand from chair Lower Body Dressing: Simulated;Moderate assistance Where Assessed - Lower Body Dressing: Sit to stand from chair Toilet Transfer: Performed;Minimal assistance Toilet Transfer Method: Ambulating Toilet Transfer Equipment: Comfort height toilet;Grab bars Toileting - Clothing Manipulation: Performed;Minimal assistance Where Assessed - Toileting Clothing Manipulation: Sit to stand from 3-in-1 or toilet Toileting - Hygiene: Performed;Minimal assistance Where Assessed - Toileting Hygiene: Sit to stand from 3-in-1 or toilet Tub/Shower Transfer: Not assessed Tub/Shower Transfer Method: Not assessed Equipment Used: Rolling walker Ambulation Related to ADLs: Pt used RW and min assist to ambulate to the bathroom.  Needed assist for guiding walker secondary to decreased vision. ADL Comments: Pt demeonstrates difficulty with UB selfcare secondary to right shoulder impairment.  In addition, is unable to donn or doff her socks.  Will benefit from AE instruction through home health services to increase independence with basic selfcare tasks.  Pt also tends to try and  use momentum for all sit to stand, and at times flops back onto surfaces when she is trying  to sit down.. Vision/Perception  Vision - History Baseline Vision: Legally blind Patient Visual Report: No change from baseline Vision - Assessment Vision Assessment: Vision not tested Perception Perception: Within Functional Limits Praxis Praxis: Intact Cognition Cognition Arousal/Alertness: Awake/alert Overall Cognitive Status: Appears within functional limits for tasks assessed Orientation Level: Oriented X4 Sensation/Coordination Sensation Light Touch: Appears Intact (In BUEs) Stereognosis: Appears Intact Coordination Gross Motor Movements are Fluid and Coordinated: Yes Fine Motor Movements are Fluid and Coordinated: Yes Extremity Assessment RUE Assessment RUE Assessment: Exceptions to [facility] RUE AROM (degrees) RUE Overall AROM Comments: Pt with history of RUE shoulder fracture and subsequent rotator cuff tear.  Pt only able to elicit approximately 40 degrees of shoulder flexion actively.  Elbow AROM and digit AROM all WFLS.  Elbow and shoulder strength 4/5 throughtout. LUE Assessment LUE Assessment: Within Functional Limits (Strength 4/5 throughtout.) Mobility  Bed Mobility Bed Mobility: No Transfers Transfers: Yes Sit to Stand: 3: Mod assist;From chair/3-in-1;With upper extremity assist Stand to Sit: With armrests;To chair/3-in-1;With upper extremity assist Stand to Sit Details: Pt needs mod assist to control descend End of Session OT - End of Session Activity Tolerance: Patient limited by fatigue Patient left: in chair;with call bell in reach General Behavior During Session: [facility] for tasks performed Cognition: [facility] for tasks performed   [facility] 11/25/2011, 1:51 PM

## 2011-11-25 NOTE — Progress Notes (Signed)
Patient ID: [name], female   DOB: [birth year], 66 y.o.   MRN: [id number] 5 Days Post-Op  Subjective: Pt feels well.  Dressing just changed.  Objective: Vital signs in last 24 hours: Temp:  [97.6 F (36.4 C)-98.3 F (36.8 C)] 97.6 F (36.4 C) (04/15 0500) Pulse Rate:  [88-91] 88  (04/15 0500) Resp:  [18-19] 18  (04/15 0500) BP: (126-185)/(69-79) 185/79 mmHg (04/15 0500) SpO2:  [95 %-98 %] 98 % (04/15 0500) Last BM Date: 11/24/11  Intake/Output from previous day: 04/14 0701 - 04/15 0700 In: 1395 [P.O.:720; I.V.:375; IV Piggyback:300] Out: 2860 [Urine:2860] Intake/Output this shift:    PE: Skin: per RN wound is clean and health.  Dressing just changed so packing not removed.  RN reports no purulent drainage.  Lab Results:   Basename 11/25/11 0525 11/24/11 0600  WBC 5.9 5.8  HGB 9.3* 8.7*  HCT 28.4* 27.0*  PLT 322 300   BMET  Basename 11/25/11 0525 11/24/11 0600  NA 139 142  K 4.0 4.4  CL 103 108  CO2 31 28  GLUCOSE 158* 187*  BUN 8 10  CREATININE 0.90 0.92  CALCIUM 10.4 10.4   PT/INR No results found for this basename: LABPROT:2,INR:2 in the last 72 hours CMP     Component Value Date/Time   NA 139 11/25/2011 0525   K 4.0 11/25/2011 0525   CL 103 11/25/2011 0525   CO2 31 11/25/2011 0525   GLUCOSE 158* 11/25/2011 0525   BUN 8 11/25/2011 0525   CREATININE 0.90 11/25/2011 0525   CALCIUM 10.4 11/25/2011 0525   PROT 6.3 11/25/2011 0525   ALBUMIN 2.6* 11/25/2011 0525   AST 29 11/25/2011 0525   ALT 25 11/25/2011 0525   ALKPHOS 59 11/25/2011 0525   BILITOT 0.1* 11/25/2011 0525   GFRNONAA 65* 11/25/2011 0525   GFRAA 75* 11/25/2011 0525   Lipase  No results found for this basename: lipase       Studies/Results: No results found.  Anti-infectives: Anti-infectives     Start     Dose/Rate Route Frequency Ordered Stop   11/25/11 0900   levofloxacin (LEVAQUIN) tablet 500 mg        500 mg Oral Daily 11/25/11 0759     11/25/11 0000   levofloxacin (LEVAQUIN) 500  MG tablet        500 mg Oral Daily 11/25/11 0803 12/05/11 2359   11/20/11 0900   vancomycin (VANCOCIN) 1,250 mg in sodium chloride 0.9 % 250 mL IVPB  Status:  Discontinued        1,250 mg 166.7 mL/hr over 90 Minutes Intravenous Every 24 hours 11/20/11 0804 11/25/11 0759   11/19/11 0800   piperacillin-tazobactam (ZOSYN) IVPB 3.375 g  Status:  Discontinued        3.375 g 12.5 mL/hr over 240 Minutes Intravenous Every 8 hours 11/19/11 0731 11/25/11 0759   11/18/11 1000   amoxicillin-clavulanate (AUGMENTIN) 875-125 MG per tablet 1 tablet  Status:  Discontinued        1 tablet Oral Every 12 hours 11/18/11 0803 11/19/11 0715   11/17/11 2330   vancomycin (VANCOCIN) 1,250 mg in sodium chloride 0.9 % 250 mL IVPB  Status:  Discontinued        1,250 mg 166.7 mL/hr over 90 Minutes Intravenous Every 12 hours 11/17/11 1122 11/19/11 0731   11/17/11 1130   vancomycin (VANCOCIN) 1,250 mg in sodium chloride 0.9 % 250 mL IVPB        1,250 mg 166.7 mL/hr over  90 Minutes Intravenous  Once 11/17/11 1122 11/17/11 1335   11/17/11 0500   vancomycin (VANCOCIN) IVPB 1000 mg/200 mL premix        1,000 mg 200 mL/hr over 60 Minutes Intravenous  Once 11/17/11 0456 11/17/11 0712           Assessment/Plan  1. Right groin abscess, s/p I&D  Plan: 1. Ok to [employer] home from out standpoint.  HH set up for wound care.  RTC in 2 weeks with Dr. Janee Morn for wound check.   LOS: 8 days    Trenika Hudson E 11/25/2011

## 2011-11-25 NOTE — Discharge Summary (Signed)
[id number] dictated discharge summary

## 2011-11-25 NOTE — Discharge Summary (Signed)
[name], [name]             ACCOUNT NO.:  [id number]  MEDICAL RECORD NO.:  [id number]  LOCATION:  5527                         FACILITY:  [facility]  PHYSICIAN:  Gaspar Garbe, M.D.DATE OF BIRTH:  [birth year]  DATE OF ADMISSION:  11/17/2011 DATE OF DISCHARGE:  11/25/2011                              DISCHARGE SUMMARY   MEDICATIONS ON DISCHARGE: 1. Hydrocodone 5/325, 1-2 tablets every 4 hours as needed for severe     pain. 2. Levaquin 500 mg p.o. daily. 3. Amlodipine/valsartan as Exforge 10/320 mg once daily. 4. Aspirin 81 mg daily. 5. Calcium 1500 mg, 2 tablets in the morning, 1 tablet at night. 6. Vitamin D 1000 units by mouth daily. 7. Pletal 100 mg by mouth daily. 8. Dexilant 30 mg p.o. daily. 9. Colace 100 mg p.o. b.i.d. as needed for stool softener. 10.Neurontin 300 mg p.o. at bedtime. 11.Amaryl 2 mg by mouth twice daily before meals. 12.Lantus insulin 40 units subcu at bedtime. 13.Metformin 1000 mg p.o. daily at breakfast. 14.Prednisolone acetate, Pred Forte 1% ophthalmic solution, 1 drop in     both eyes twice daily. 15.Onglyza 5 mg p.o. daily. 16.Demadex 20 mg once daily.  FINAL DIAGNOSES: 1. Sepsis. 2. Groin abscess with negative cultures. 3. Morbid obesity. 4. Hypertension. 5. Hyperlipidemia. 6. Hypotensive episode following sepsis. 7. Peripheral neuropathy. 8. Diabetes mellitus type 2. 9. Legally blind. 10.Osteoarthritis. 11.Peripheral vascular disease. 12.Chronic back pain. 13.Right shoulder pain following a fall injury followed by Dr.     Magnus Ivan.  PHYSICAL EXAMINATION:  On date of discharge: VITAL SIGNS:  Temperature 97.6, pulse 88, respiratory rate 18, blood pressure 185/79, some of her medications were held at that time, satting 98% on room air. GENERAL:  No acute distress. HEENT:  Normocephalic, atraumatic, PERRLA, EOMI.  ENT is within normal limits.  NECK:  Supple.  No lymphadenopathy, JVD or bruit. HEART:  Regular rate and  rhythm. LUNGS:  Clear to auscultation bilaterally. ABDOMEN:  Soft, nontender, normoactive bowel sounds.  No hepatosplenomegaly appreciated. EXTREMITIES:  The patient has right groin abscess which is being redressed at this point, this appears to be clean without purulence. She does have mild neuropathy of her feet.  NEUROLOGIC:  The patient is oriented to person, place, and time.  She is legally blind but has no other new deficits.  LABORATORY TESTING:  On date of discharge, hemoglobin 9.3, hematocrit 28.4, white count 5.9, now normalized.  BUN and creatinine are 8 and 0.9 respectively, now normalized.  CBG this morning was 158.  IMAGING:  The patient had a portable chest x-ray, which shows improved aeration and mild pulmonary vascular congestion.  On November 21, 2011, the patient had a urinalysis which was positive, status post Foley with a negative culture.  Initially, urinalysis was negative.  Her MRSA PCR was negative.  Blood cultures 1 of 2 was initially reported as a gram- negative plus with coag-negative staph and consistent with skin flora. The patient has also had wound cultures.  The skin and subcutaneous tissue were associated with acute abscess on surgical pathology from the I and D performed on November 20, 2011.  Her culture showed rare squamous epithelial cells and no organisms and no white blood  cells on anaerobic cultures.  She had no growth on any further aerobic cultures including blood cultures that was taken from the wound.  SUMMARY OF HOSPITAL COURSE:  The patient, [name] was admitted by my partner, Dr. Felipa Eth on preceding Sunday night, November 17, 2011, early a.m.  [name] has become febrile and little bit more confused at home showing poor appetite.  She was brought here by her family.  The abscess in the right groin was noted.  Ultrasound was performed which did not show any evidence of a pocket of pus or anything that was drainable.  The patient was  initially started on IV vancomycin.  I saw her the next day, and she did not appear to be as well with a mild increase in her renal function from her baseline creatinine around 1, now up to 2 with decreased urine output.  Her Lasix and irbesartan were discontinued at that point, and vancomycin was added a day later.  The patient had also initially been placed on p.o. Augmentin for gram- negative coverage.  During the next 24 hours, she continued to have lower blood pressures, and because of concerns for ongoing sepsis, the patient was transferred to the ICU.  She initially received 2 L of fluid on the floor and received another liter of fluid in the ICU.  Her blood pressures remained stable.  She was seen by Critical Care and Surgery who attempted an I and D with negative cultures.  Surgery continued to follow her through the course of her treatment.  Once the patient left the ICU setting, Critical Care signed off.  She remained otherwise stable.  Physical Therapy, Occupational Therapy, and Home Health have been arranged by Social Work.  The patient has good family support with her son, a daughter and daughter-in-law have been present most days but was not present at the time of discharge.  Of note, the patient is actually due for an office appointment on date of discharge so be rescheduled for the next week.  We will coordinate this with the patient's son that she is legally blind and cannot drive herself, so he must stay within his work schedule as we normally do as an outpatient. [name] is back in her baseline.  At this time, her dressing is being changed in her right groin and instruction will be given to the family as well as Home Health in following up.  FOLLOWUP:  The patient is to be seen at Dr. Gerlene Burdock Robertt Buda's office in 1 week.  His nurse, Carollee Herter, will call later today or tomorrow to make arrangements for this.  CONDITION ON DISCHARGE:  Stable.     Gaspar Garbe,  M.D.     RWT/MEDQ  D:  11/25/2011  T:  11/25/2011  Job:  [id number]

## 2011-11-25 NOTE — Plan of Care (Signed)
Problem: Discharge Progression Outcomes Goal: Discharge plan in place and appropriate Outcome: Completed/Met Date Met:  11/25/11 Discharge home with [facility]

## 2011-11-25 NOTE — Progress Notes (Signed)
Milinda Cave discharged Home per MD order.  Discharge instructions reviewed and discussed with the patient and family member Tywanna, all questions and concerns answered. Copy of instructions and scripts given to patient and family member.   Mckenzey, Parcell  Home Medication Instructions ZOX:[id number]   Printed on:11/25/11 1626  Medication Information                    cholecalciferol (VITAMIN D) 1000 UNITS tablet Take 1,000 Units by mouth daily.           aspirin EC 81 MG tablet Take 81 mg by mouth daily.           torsemide (DEMADEX) 20 MG tablet Take 10 mg by mouth daily.           glimepiride (AMARYL) 4 MG tablet Take 2 mg by mouth 2 (two) times daily before a meal.           docusate sodium (COLACE) 100 MG capsule Take 100 mg by mouth 2 (two) times daily as needed. Stool softener           Calcium 1500 MG tablet Take 1,500-3,000 mg by mouth 2 (two) times daily. Takes 2 in the morning and 1 at night           saxagliptin HCl (ONGLYZA) 5 MG TABS tablet Take 5 mg by mouth daily.           insulin glargine (LANTUS) 100 UNIT/ML injection Inject 40 Units into the skin at bedtime.           amLODipine-valsartan (EXFORGE) 10-320 MG per tablet Take 1 tablet by mouth daily.           metFORMIN (GLUCOPHAGE) 1000 MG tablet Take 1,000 mg by mouth daily with breakfast.           Dexlansoprazole (DEXILANT) 30 MG capsule Take 30 mg by mouth daily.           cilostazol (PLETAL) 100 MG tablet Take 100 mg by mouth daily.           gabapentin (NEURONTIN) 300 MG capsule Take 300 mg by mouth at bedtime.           prednisoLONE acetate (PRED FORTE) 1 % ophthalmic suspension Place 1 drop into both eyes 2 (two) times daily.           HYDROcodone-acetaminophen (NORCO) 5-325 MG per tablet Take 1-2 tablets by mouth every 4 (four) hours as needed.           levofloxacin (LEVAQUIN) 500 MG tablet Take 1 tablet (500 mg total) by mouth daily.             Patients skin is clean and  dry with a clean, dry dressing intact to rt groin surgical wound. Wound care reviewed with pt and Tywanna and Tywanna has observed the RN change the wound on previously. Tywanna verbalizes an understanding, dsg change education to be reinforced by [facility].   IV site discontinued and catheter remains intact. Site without signs and symptoms of complications. Dressing and pressure applied.  Patient escorted to car by NT in a wheelchair,  no distress noted upon discharge.  Julien Nordmann [facility] 11/25/2011 4:26 PM

## 2011-11-26 LAB — CULTURE, BLOOD (ROUTINE X 2)
Culture  Setup Time: 201304100439
Culture: NO GROWTH

## 2011-11-26 NOTE — Progress Notes (Signed)
   CARE MANAGEMENT NOTE 11/26/2011  Patient:  [name], [name]   Account Number:  [id number]  Date Initiated:  11/22/2011  Documentation initiated by:  Ronny Flurry  Subjective/Objective Assessment:   DX: sepsis , cellulitis of left groin    11-20-11 IRRIGATION AND DEBRIDEMENT PERIRECTAL ABSCESS     Action/Plan:   Referral for [facility] for w-d dressing training for daughter in law. Met with pt who selected [facility] for [facility] services.   Anticipated DC Date:  11/25/2011   Anticipated DC Plan:  HOME W HOME HEALTH SERVICES         [facility] Choice  HOME HEALTH   Choice offered to / List presented to:  C-1 Patient        HH arranged  HH-1 RN      [facility] agency  [facility]   Status of service:  Completed, signed off Medicare Important Message given?   (If response is "NO", the following Medicare IM given date fields will be blank) Date Medicare IM given:   Date Additional Medicare IM given:    Discharge Disposition:  HOME W HOME HEALTH SERVICES  Per UR Regulation:  Reviewed for med. necessity/level of care/duration of stay  If discussed at Long Length of Stay Meetings, dates discussed:    Comments:  11/22/2010 Met with pt who selected [facility] for [facility] w-d dressing changes to be performed by pt daughter in law. Will follow for any other d/c needs. Await eval of pt mobility. Johny Shock RN MPH, Case manager, [phone]  11-20-11  MD plan :  Continue to await cultures.   Will refine abx over weekend depending on cultures and convert to PO if possible.  Likely home on Monday if does well through the weekend.  Ronny Flurry RN BSN

## 2011-12-05 ENCOUNTER — Telehealth (INDEPENDENT_AMBULATORY_CARE_PROVIDER_SITE_OTHER): Payer: Self-pay | Admitting: General Surgery

## 2011-12-05 ENCOUNTER — Encounter (INDEPENDENT_AMBULATORY_CARE_PROVIDER_SITE_OTHER): Payer: Self-pay | Admitting: General Surgery

## 2011-12-05 ENCOUNTER — Ambulatory Visit (INDEPENDENT_AMBULATORY_CARE_PROVIDER_SITE_OTHER): Payer: Medicare HMO | Admitting: General Surgery

## 2011-12-05 VITALS — BP 139/80 | HR 80 | Temp 97.7°F | Resp 12 | Ht 65.0 in | Wt 300.2 lb

## 2011-12-05 DIAGNOSIS — L03319 Cellulitis of trunk, unspecified: Secondary | ICD-10-CM

## 2011-12-05 DIAGNOSIS — L02214 Cutaneous abscess of groin: Secondary | ICD-10-CM

## 2011-12-05 NOTE — Progress Notes (Signed)
Subjective:     Patient ID: [name], female   DOB: 1945/11/06, 66 y.o.   MRN: [id number]  HPI Patient presents for followup status post incision and drainage of right groin abscess. She was quite ill in the hospital from this. She is doing much better at home. Her daughter-in-law and granddaughter are doing twice a day dressing changes with wet-to-dry gauze. She's otherwise eating well and feeling much better.  Review of Systems     Objective:   Physical Exam Wound is clean with healthy granulation tissue that is filled and almost up to the skin surface. It does remain about 8 cm x 4 cm. the undermining is almost completely filled in. No evidence of ongoing infection    Assessment:     Improving status post incision and drainage of right groin abscess    Plan:     Continue b.i.d. dressings and will see her back in a couple weeks.

## 2011-12-05 NOTE — Telephone Encounter (Signed)
done

## 2011-12-18 ENCOUNTER — Encounter (INDEPENDENT_AMBULATORY_CARE_PROVIDER_SITE_OTHER): Payer: Self-pay | Admitting: General Surgery

## 2011-12-18 ENCOUNTER — Ambulatory Visit (INDEPENDENT_AMBULATORY_CARE_PROVIDER_SITE_OTHER): Payer: Medicare HMO | Admitting: General Surgery

## 2011-12-18 VITALS — BP 133/90 | HR 89 | Temp 97.6°F | Ht 65.0 in | Wt 299.0 lb

## 2011-12-18 DIAGNOSIS — L02214 Cutaneous abscess of groin: Secondary | ICD-10-CM

## 2011-12-18 DIAGNOSIS — L03319 Cellulitis of trunk, unspecified: Secondary | ICD-10-CM

## 2011-12-18 NOTE — Progress Notes (Signed)
Subjective:     Patient ID: [name], female   DOB: [birth year], 66 y.o.   MRN: [id number]  HPI Patient is doing well. Her granddaughter has been doing her wound care. She does ask a question of 2 small lumps on her abdominal wall.  Review of Systems     Objective:   Physical Exam Right groin wound is granulated at the surface and is contracted a little bit. It is completely clean with no evidence of ongoing infection. Abdominal wall has to likely hematomas at Lovenox injection sites. His overlying resolving contusion and no evidence of infection    Assessment:     Doing well status post  incision and drainage of complex right groin abscess   Plan:     Change on care to dry gauze daily. Return in 3 weeks. I feel the small hematomas in her abdominal wall will reabsorb.

## 2012-01-08 ENCOUNTER — Encounter (INDEPENDENT_AMBULATORY_CARE_PROVIDER_SITE_OTHER): Payer: Self-pay | Admitting: General Surgery

## 2012-01-08 ENCOUNTER — Ambulatory Visit (INDEPENDENT_AMBULATORY_CARE_PROVIDER_SITE_OTHER): Payer: Medicare HMO | Admitting: General Surgery

## 2012-01-08 VITALS — BP 142/98 | HR 76 | Temp 97.6°F | Resp 16 | Ht 65.0 in | Wt 297.2 lb

## 2012-01-08 DIAGNOSIS — M7981 Nontraumatic hematoma of soft tissue: Secondary | ICD-10-CM

## 2012-01-08 DIAGNOSIS — L02214 Cutaneous abscess of groin: Secondary | ICD-10-CM

## 2012-01-08 NOTE — Progress Notes (Signed)
Subjective:     Patient ID: [name], female   DOB: 1946/03/21, 66 y.o.   MRN: [id number]  HPI Patient presents status post incision and drainage of right groin abscess. Her granddaughter has been doing wound care. She is feeling better.  Review of Systems     Objective:   Physical Exam Abdomen is soft and nontender. Small hematomas from Lovenox injection sites are improved but not completely resolved. Exam reveals almost complete reepithelialization and contraction of her wound with no ongoing infection.    Assessment:     Doing very well status post incision and drainage of right groin abscess.    Plan:     Return p.r.n.

## 2012-11-10 DIAGNOSIS — H3320 Serous retinal detachment, unspecified eye: Secondary | ICD-10-CM

## 2012-11-10 HISTORY — DX: Serous retinal detachment, unspecified eye: H33.20

## 2012-12-09 ENCOUNTER — Encounter: Payer: Self-pay | Admitting: Gastroenterology

## 2013-06-01 ENCOUNTER — Ambulatory Visit (INDEPENDENT_AMBULATORY_CARE_PROVIDER_SITE_OTHER): Payer: Medicare HMO

## 2013-06-01 VITALS — BP 213/110 | HR 85 | Resp 20 | Ht 65.0 in | Wt 300.0 lb

## 2013-06-01 DIAGNOSIS — E1140 Type 2 diabetes mellitus with diabetic neuropathy, unspecified: Secondary | ICD-10-CM

## 2013-06-01 DIAGNOSIS — M2040 Other hammer toe(s) (acquired), unspecified foot: Secondary | ICD-10-CM

## 2013-06-01 DIAGNOSIS — E1142 Type 2 diabetes mellitus with diabetic polyneuropathy: Secondary | ICD-10-CM

## 2013-06-01 DIAGNOSIS — E1149 Type 2 diabetes mellitus with other diabetic neurological complication: Secondary | ICD-10-CM

## 2013-06-01 DIAGNOSIS — B351 Tinea unguium: Secondary | ICD-10-CM

## 2013-06-01 DIAGNOSIS — M79609 Pain in unspecified limb: Secondary | ICD-10-CM

## 2013-06-01 NOTE — Progress Notes (Signed)
  Subjective:    Patient ID: [name], female    DOB: February 23, 1946, 67 y.o.   MRN: [id number] "Just file my nails and I think I was approved for sneakers."  HPI patient presents this time for followup diabetic foot and nail care. Has been approved by primary physician for diabetic extra-depth shoes. No changes medication her health status otherwise noted. Nails brittle discolored and darkened.    Review of Systems Not reviewed    Objective:   Physical Exam Vascular status as follows DP plus one over 4 PT nonpalpable bilateral +1 to +2 pitting edema bilateral epicritic and proprioceptive sensations diminished on Semmes Weinstein testing bilateral normal plantar response DTRs not listed. Skin color pigment normal hair growth absent bilateral. No significant keratoses no open wounds or ulcerations. Mild semirigid digital contractures adductovarus rotation was or digits 234 and 5. Nails thick brittle discolored and criptotic 1 through 5 bilateral       Assessment & Plan:  Assessment diabetes attempting factors peripheral neuropathy and angiopathy. There is also digit deformity with history of keratoses. No active ulcerations current time. Nails debrided x10 the presence of diabetes cocking factors. Measurements for diabetics extra shoes are carried out at this time and boffo impressions carried out for custom molded dual density Plastizote inlays followup for palliative care in 3 months. Patient be called when shoes inlays are ready for fitting and dispensing within the next month.  Alvan Dame DPM

## 2013-06-01 NOTE — Patient Instructions (Signed)

## 2013-06-21 ENCOUNTER — Other Ambulatory Visit: Payer: Self-pay

## 2013-08-10 ENCOUNTER — Other Ambulatory Visit: Payer: Self-pay

## 2013-08-11 NOTE — Telephone Encounter (Signed)
refill request for Gabapentin 300mg .  Refill okayed with 3 additiional.

## 2013-09-07 ENCOUNTER — Ambulatory Visit: Payer: Medicare HMO

## 2013-09-08 ENCOUNTER — Encounter: Payer: Self-pay | Admitting: Gastroenterology

## 2013-12-24 ENCOUNTER — Ambulatory Visit: Payer: Medicare HMO

## 2014-01-04 ENCOUNTER — Ambulatory Visit: Payer: Medicare HMO

## 2014-01-11 ENCOUNTER — Ambulatory Visit (INDEPENDENT_AMBULATORY_CARE_PROVIDER_SITE_OTHER): Payer: Medicare HMO

## 2014-01-11 VITALS — BP 144/86 | HR 72 | Resp 12

## 2014-01-11 DIAGNOSIS — M19019 Primary osteoarthritis, unspecified shoulder: Secondary | ICD-10-CM

## 2014-01-11 DIAGNOSIS — M79609 Pain in unspecified limb: Secondary | ICD-10-CM

## 2014-01-11 DIAGNOSIS — M2040 Other hammer toe(s) (acquired), unspecified foot: Secondary | ICD-10-CM

## 2014-01-11 DIAGNOSIS — E1142 Type 2 diabetes mellitus with diabetic polyneuropathy: Secondary | ICD-10-CM

## 2014-01-11 DIAGNOSIS — I739 Peripheral vascular disease, unspecified: Secondary | ICD-10-CM

## 2014-01-11 DIAGNOSIS — E1149 Type 2 diabetes mellitus with other diabetic neurological complication: Secondary | ICD-10-CM

## 2014-01-11 DIAGNOSIS — E1140 Type 2 diabetes mellitus with diabetic neuropathy, unspecified: Secondary | ICD-10-CM

## 2014-01-11 DIAGNOSIS — B351 Tinea unguium: Secondary | ICD-10-CM

## 2014-01-11 NOTE — Patient Instructions (Signed)
Diabetes and Foot Care Diabetes may cause you to have problems because of poor blood supply (circulation) to your feet and legs. This may cause the skin on your feet to become thinner, break easier, and heal more slowly. Your skin may become dry, and the skin may peel and crack. You may also have nerve damage in your legs and feet causing decreased feeling in them. You may not notice minor injuries to your feet that could lead to infections or more serious problems. Taking care of your feet is one of the most important things you can do for yourself.  HOME CARE INSTRUCTIONS  Wear shoes at all times, even in the house. Do not go barefoot. Bare feet are easily injured.  Check your feet daily for blisters, cuts, and redness. If you cannot see the bottom of your feet, use a mirror or ask someone for help.  Wash your feet with warm water (do not use hot water) and mild soap. Then pat your feet and the areas between your toes until they are completely dry. Do not soak your feet as this can dry your skin.  Apply a moisturizing lotion or petroleum jelly (that does not contain alcohol and is unscented) to the skin on your feet and to dry, brittle toenails. Do not apply lotion between your toes.  Trim your toenails straight across. Do not dig under them or around the cuticle. File the edges of your nails with an emery board or nail file.  Do not cut corns or calluses or try to remove them with medicine.  Wear clean socks or stockings every day. Make sure they are not too tight. Do not wear knee-high stockings since they may decrease blood flow to your legs.  Wear shoes that fit properly and have enough cushioning. To break in new shoes, wear them for just a few hours a day. This prevents you from injuring your feet. Always look in your shoes before you put them on to be sure there are no objects inside.  Do not cross your legs. This may decrease the blood flow to your feet.  If you find a minor scrape,  cut, or break in the skin on your feet, keep it and the skin around it clean and dry. These areas may be cleansed with mild soap and water. Do not cleanse the area with peroxide, alcohol, or iodine.  When you remove an adhesive bandage, be sure not to damage the skin around it.  If you have a wound, look at it several times a day to make sure it is healing.  Do not use heating pads or hot water bottles. They may burn your skin. If you have lost feeling in your feet or legs, you may not know it is happening until it is too late.  Make sure your health care provider performs a complete foot exam at least annually or more often if you have foot problems. Report any cuts, sores, or bruises to your health care provider immediately. SEEK MEDICAL CARE IF:   You have an injury that is not healing.  You have cuts or breaks in the skin.  You have an ingrown nail.  You notice redness on your legs or feet.  You feel burning or tingling in your legs or feet.  You have pain or cramps in your legs and feet.  Your legs or feet are numb.  Your feet always feel cold. SEEK IMMEDIATE MEDICAL CARE IF:   There is increasing redness,   swelling, or pain in or around a wound.  There is a red line that goes up your leg.  Pus is coming from a wound.  You develop a fever or as directed by your health care provider.  You notice a bad smell coming from an ulcer or wound. Document Released: 07/26/2000 Document Revised: 03/31/2013 Document Reviewed: 01/05/2013 [employer] Patient Information 2014 [employer].  

## 2014-01-11 NOTE — Progress Notes (Signed)
   Subjective:    Patient ID: [name], female    DOB: [birth year], 68 y.o.   MRN: [id number]  HPI TOENAILS TRIM AND DISPENSED DIABETIC SHOES.   Review of Systems no new findings or systemic changes noted     Objective:   Physical Exam Neurovascular status is diminished with pedal pulses palpable DP and PT plus one over 4 bilateral +1 pitting with +1 edema noted Refill timed 3-4 seconds all digits neurologically epicritic and proprioceptive sensations diminished on Semmes Weinstein testing to forefoot digits and plantar arch dorsal plantar response DTRs not elicited dermatologically skin color pigment normal hair growth absent nails thick brittle criptotic discolored painful and tender 1 through 5 bilateral. Patient is diabetic shoes and been ready however patient had her right resume her she could get back to the office the morning 6 months and she was last seen. At this time shoes are dispensed with break in fitting instructions a fit and contour well to the patient's foot with full contact to the 3 pairs of dual density Plastizote inlays custom molded the patient's foot as well. Allred instructions are given for shoewear break in the replacing old shoes are worn and broken down a Velcro Oxford type shoe is dispensed at this time.       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 All times contact us visiting changes or difficulties occur patient did have questions about her peripheral  neuropathy is taking gabapentin as instructed indicates her hands are starting to affect her more I did make recommendation sticky One-A-Day type multivitamin any Steward Drone vitamin E. are [employer] or [employer] generic vitamin would be beneficial patient will try this and see if it improves or neuropathy symptomology as well  Alvan Dame DPM

## 2014-02-13 ENCOUNTER — Emergency Department (HOSPITAL_COMMUNITY)
Admission: EM | Admit: 2014-02-13 | Discharge: 2014-02-14 | Disposition: A | Discharge status: Home / Self Care | Payer: Medicare HMO | Attending: Emergency Medicine | Admitting: Emergency Medicine

## 2014-02-13 ENCOUNTER — Encounter (HOSPITAL_COMMUNITY): Payer: Self-pay | Admitting: Emergency Medicine

## 2014-02-13 ENCOUNTER — Emergency Department (HOSPITAL_COMMUNITY): Payer: Medicare HMO

## 2014-02-13 DIAGNOSIS — Z794 Long term (current) use of insulin: Secondary | ICD-10-CM | POA: Insufficient documentation

## 2014-02-13 DIAGNOSIS — Z87891 Personal history of nicotine dependence: Secondary | ICD-10-CM | POA: Insufficient documentation

## 2014-02-13 DIAGNOSIS — L03314 Cellulitis of groin: Secondary | ICD-10-CM

## 2014-02-13 DIAGNOSIS — Z79899 Other long term (current) drug therapy: Secondary | ICD-10-CM | POA: Insufficient documentation

## 2014-02-13 DIAGNOSIS — L02219 Cutaneous abscess of trunk, unspecified: Secondary | ICD-10-CM | POA: Insufficient documentation

## 2014-02-13 DIAGNOSIS — H543 Unqualified visual loss, both eyes: Secondary | ICD-10-CM | POA: Insufficient documentation

## 2014-02-13 DIAGNOSIS — L03319 Cellulitis of trunk, unspecified: Principal | ICD-10-CM

## 2014-02-13 DIAGNOSIS — I10 Essential (primary) hypertension: Secondary | ICD-10-CM | POA: Insufficient documentation

## 2014-02-13 DIAGNOSIS — E119 Type 2 diabetes mellitus without complications: Secondary | ICD-10-CM | POA: Insufficient documentation

## 2014-02-13 LAB — CBG MONITORING, ED: GLUCOSE-CAPILLARY: 275 mg/dL — AB (ref 70–99)

## 2014-02-13 LAB — CBC WITH DIFFERENTIAL/PLATELET
Basophils Absolute: 0.1 10*3/uL (ref 0.0–0.1)
Basophils Relative: 1 % (ref 0–1)
EOS PCT: 2 % (ref 0–5)
Eosinophils Absolute: 0.2 10*3/uL (ref 0.0–0.7)
HEMATOCRIT: 33.4 % — AB (ref 36.0–46.0)
Hemoglobin: 10.7 g/dL — ABNORMAL LOW (ref 12.0–15.0)
LYMPHS ABS: 2.1 10*3/uL (ref 0.7–4.0)
LYMPHS PCT: 29 % (ref 12–46)
MCH: 26.7 pg (ref 26.0–34.0)
MCHC: 32 g/dL (ref 30.0–36.0)
MCV: 83.3 fL (ref 78.0–100.0)
Monocytes Absolute: 0.3 10*3/uL (ref 0.1–1.0)
Monocytes Relative: 4 % (ref 3–12)
NEUTROS ABS: 4.5 10*3/uL (ref 1.7–7.7)
Neutrophils Relative %: 64 % (ref 43–77)
PLATELETS: 253 10*3/uL (ref 150–400)
RBC: 4.01 MIL/uL (ref 3.87–5.11)
RDW: 16.6 % — ABNORMAL HIGH (ref 11.5–15.5)
WBC: 7.1 10*3/uL (ref 4.0–10.5)

## 2014-02-13 LAB — BASIC METABOLIC PANEL
ANION GAP: 14 (ref 5–15)
BUN: 19 mg/dL (ref 6–23)
CALCIUM: 10.3 mg/dL (ref 8.4–10.5)
CHLORIDE: 104 meq/L (ref 96–112)
CO2: 24 meq/L (ref 19–32)
Creatinine, Ser: 1.21 mg/dL — ABNORMAL HIGH (ref 0.50–1.10)
GFR calc Af Amer: 51 mL/min — ABNORMAL LOW (ref >90)
GFR calc non Af Amer: 44 mL/min — ABNORMAL LOW (ref >90)
GLUCOSE: 290 mg/dL — AB (ref 70–99)
Potassium: 4.5 mEq/L (ref 3.7–5.3)
SODIUM: 142 meq/L (ref 137–147)

## 2014-02-13 LAB — I-STAT CG4 LACTIC ACID, ED: LACTIC ACID, VENOUS: 3.62 mmol/L — AB (ref 0.5–2.2)

## 2014-02-13 MED ORDER — MORPHINE SULFATE 4 MG/ML IJ SOLN
4.0000 mg | Freq: Once | INTRAMUSCULAR | Status: AC
  Administered 2014-02-13: 4 mg via INTRAVENOUS
  Filled 2014-02-13: qty 1

## 2014-02-13 MED ORDER — VANCOMYCIN HCL IN DEXTROSE 1-5 GM/200ML-% IV SOLN
1000.0000 mg | Freq: Once | INTRAVENOUS | Status: AC
  Administered 2014-02-13: 1000 mg via INTRAVENOUS
  Filled 2014-02-13: qty 200

## 2014-02-13 MED ORDER — SODIUM CHLORIDE 0.9 % IV BOLUS (SEPSIS)
1000.0000 mL | Freq: Once | INTRAVENOUS | Status: AC
  Administered 2014-02-13: 1000 mL via INTRAVENOUS

## 2014-02-13 MED ORDER — IOHEXOL 300 MG/ML  SOLN
25.0000 mL | INTRAMUSCULAR | Status: AC
  Administered 2014-02-13: 25 mL via ORAL

## 2014-02-13 MED ORDER — DOXYCYCLINE HYCLATE 100 MG PO CAPS: 100.0000 mg | ORAL_CAPSULE | Freq: Two times a day (BID) | ORAL | Status: DC

## 2014-02-13 MED ORDER — ONDANSETRON HCL 4 MG/2ML IJ SOLN
4.0000 mg | Freq: Once | INTRAMUSCULAR | Status: AC
  Administered 2014-02-13: 4 mg via INTRAVENOUS
  Filled 2014-02-13: qty 2

## 2014-02-13 MED ORDER — IOHEXOL 300 MG/ML  SOLN
100.0000 mL | Freq: Once | INTRAMUSCULAR | Status: AC | PRN
  Administered 2014-02-13: 100 mL via INTRAVENOUS

## 2014-02-13 NOTE — ED Notes (Signed)
Dr Estell HarpinZammit given a copy of lactic acid results 3.62

## 2014-02-13 NOTE — ED Provider Notes (Signed)
2225 - Patient care assumed from Fayrene HelperBowie Tran, PA-C at shift change. Patient is a 68 year old insulin-dependent diabetic who presents for right groin pain times one to 2 weeks. Patient has history of groin abscess with sepsis which required ICU admission and surgical management back in April 2013. CT pending to evaluate for abscess formation.   CT findings today show cellulitis of the subcutaneous tissue of the right groin and labia. No evidence of abscess formation on CT today. Patient does not have a leukocytosis; however, she is noted to have a lactate of 3.6. Wound culture and blood cultures pending. Patient has been treated empirically in ED with vancomycin. Given CT findings, elevated lactate, and hx of DM, will admit for further tx with IV abx and monitoring. [facility]  2240 - Have consulted with Dr. Clelia CroftShaw of [facility]. Dr. Clelia CroftShaw does not believe patient meets criteria for admission based on history and lactate alone. No attempt at outpatient management with abx. No leukocytosis or fever. VSS over ED course. Dr. Clelia CroftShaw recommends tx with Doxycycline and f/u in office tomorrow, 02/14/14, for further monitoring of symptoms rather than inpatient admission. Plan discussed with family and patient who verbalize comfort and understanding with plan. Patient given necessary return precautions. Patient discharged in good condition with no unaddressed concerns.   Filed Vitals:   02/13/14 1813 02/13/14 1857  BP: 145/75 140/73  Pulse: 109 105  Temp: 99.4 F (37.4 C) 98.4 F (36.9 C)  TempSrc: Oral Oral  Resp: 20 15  SpO2: 95% 96%   Ct Abdomen Pelvis W Contrast  02/13/2014   CLINICAL DATA:  Right groin pain.  Previous right groin abscess.  EXAM: CT ABDOMEN AND PELVIS WITH CONTRAST  TECHNIQUE: Multidetector CT imaging of the abdomen and pelvis was performed using the standard protocol following bolus administration of intravenous contrast.  CONTRAST:  100mL OMNIPAQUE IOHEXOL 300 MG/ML  SOLN  COMPARISON:   None.  FINDINGS: A moderate size hiatal hernia is seen. The liver, gallbladder, spleen, pancreas, adrenal glands, and kidneys are normal in appearance. No evidence hydronephrosis. Small uterine fibroids noted. Adnexal regions are unremarkable in appearance. No other soft tissue masses or lymphadenopathy identified within the abdomen or pelvis.  A small paraumbilical hernia is seen containing only fat. No evidence of herniated bowel loops or bowel obstruction.  In the subcutaneous tissues of the right groin and labia, there is inflammatory change, consistent with cellulitis. No abscess is identified. No other intra-abdominal or pelvic inflammatory process identified.  IMPRESSION: Cellulitis involving the subcutaneous tissues of the right groin and labia. No evidence of abscess.  Incidental findings including moderate hiatal hernia, small paraumbilical hernia, and small uterine fibroids.   Electronically Signed   By: Myles RosenthalJohn  Stahl M.D.   On: 02/13/2014 22:06      Catherine MaduraKelly Lakaya Tolen, PA-C 02/13/14 2310

## 2014-02-13 NOTE — ED Notes (Signed)
The pt has had a boil inher groin area for 1-2 weeks.  Unknown if temp during this time.  Previous boil in the same area.  Diabetic insulin dependent

## 2014-02-13 NOTE — Discharge Instructions (Signed)
Keep area clean and dry. Take doxycycline as prescribed. Followup with your primary care doctor on 02/14/2014. Return to the emergency department if symptoms worsen such as if you develop a fever or red streaking from your area of infection.  Cellulitis Cellulitis is an infection of the skin and the tissue beneath it. The infected area is usually red and tender. Cellulitis occurs most often in the arms and lower legs.  CAUSES  Cellulitis is caused by bacteria that enter the skin through cracks or cuts in the skin. The most common types of bacteria that cause cellulitis are Staphylococcus and Streptococcus. SYMPTOMS   Redness and warmth.  Swelling.  Tenderness or pain.  Fever. DIAGNOSIS  Your caregiver can usually determine what is wrong based on a physical exam. Blood tests may also be done. TREATMENT  Treatment usually involves taking an antibiotic medicine. HOME CARE INSTRUCTIONS   Take your antibiotics as directed. Finish them even if you start to feel better.  Keep the infected arm or leg elevated to reduce swelling.  Apply a warm cloth to the affected area up to 4 times per day to relieve pain.  Only take over-the-counter or prescription medicines for pain, discomfort, or fever as directed by your caregiver.  Keep all follow-up appointments as directed by your caregiver. SEEK MEDICAL CARE IF:   You notice red streaks coming from the infected area.  Your red area gets larger or turns dark in color.  Your bone or joint underneath the infected area becomes painful after the skin has healed.  Your infection returns in the same area or another area.  You notice a swollen bump in the infected area.  You develop new symptoms. SEEK IMMEDIATE MEDICAL CARE IF:   You have a fever.  You feel very sleepy.  You develop vomiting or diarrhea.  You have a general ill feeling (malaise) with muscle aches and pains. MAKE SURE YOU:   Understand these instructions.  Will watch  your condition.  Will get help right away if you are not doing well or get worse. Document Released: 05/08/2005 Document Revised: 01/28/2012 Document Reviewed: 10/14/2011 [facility] Patient Information 2015 [employer], [place]. This information is not intended to replace advice given to you by your health care provider. Make sure you discuss any questions you have with your health care provider.

## 2014-02-13 NOTE — ED Provider Notes (Signed)
CSN: [id number]     Arrival date & time 02/13/14  1807 History   First MD Initiated Contact with Patient 02/13/14 1842     Chief Complaint  Patient presents with  . boil      (Consider location/radiation/quality/duration/timing/severity/associated sxs/prior Treatment) HPI  68 year old female who is blind with history of insulin-dependent diabetes and hypertension presents for evaluation of right groin abscess. For the past one to 2 weeks patient has noticed discomfort to her right groin, and described as sharp achy pain that felt similar to her prior infection that she had 2 years ago. She also noticed pustular discharge from her right groin. Pain is 7/10 nonradiating. He reports mild urinary discomfort. She denies any fever, chills, headache, chest pain, shortness of breath, abdominal pain, nausea vomiting diarrhea. She has been taking medication as prescribed. Incidental 13 she had a bad right groin infection that subsequently needs to sepsis requiring surgical I&D in the OR. She felt that this infection is similar to the past. She has been taking her medication as prescribed. She is up-to-date with tetanus. She lives at home with her family.  Past Medical History  Diagnosis Date  . Hypertension   . Diabetes mellitus   . Blind    Past Surgical History  Procedure Laterality Date  . Incision and drainage perirectal abscess  11/20/2011    Procedure: IRRIGATION AND DEBRIDEMENT PERIRECTAL ABSCESS;  Surgeon: Liz Malady, MD;  Location: [facility] OR;  Service: General;  Laterality: Right;  Incision and Drainage of Right Groin Abscess   Family History  Problem Relation Age of Onset  . Heart disease Mother   . Diabetes Mother   . Cancer Sister     Breast  . Diabetes Sister   . Cancer Brother     Colon  . Diabetes Brother    History  Substance Use Topics  . Smoking status: Former Games developer  . Smokeless tobacco: Never Used  . Alcohol Use: No   OB History   Grav Para Term Preterm Abortions  TAB SAB Ect Mult Living                 Review of Systems  All other systems reviewed and are negative.     Allergies  Review of patient's allergies indicates no known allergies.  Home Medications   Prior to Admission medications   Medication Sig Start Date End Date Taking? Authorizing Provider  amLODipine (NORVASC) 10 MG tablet  11/03/13   Historical Provider, MD  amLODipine-valsartan (EXFORGE) 10-320 MG per tablet Take 1 tablet by mouth daily.    Historical Provider, MD  aspirin EC 81 MG tablet Take 81 mg by mouth daily.    Historical Provider, MD  Calcium 1500 MG tablet Take 1,500-3,000 mg by mouth 2 (two) times daily. Takes 2 in the morning and 1 at night    Historical Provider, MD  cholecalciferol (VITAMIN D) 1000 UNITS tablet Take 1,000 Units by mouth daily.    Historical Provider, MD  cilostazol (PLETAL) 100 MG tablet Take 100 mg by mouth daily.    Historical Provider, MD  cloNIDine (CATAPRES - DOSED IN MG/24 HR) 0.1 mg/24hr patch Place 1 patch onto the skin once a week.    Historical Provider, MD  Dexlansoprazole (DEXILANT) 30 MG capsule Take 30 mg by mouth daily.    Historical Provider, MD  DIOVAN 320 MG tablet  11/04/13   Historical Provider, MD  docusate sodium (COLACE) 100 MG capsule Take 100 mg by mouth 2 (two)  times daily as needed. Stool softener    Historical Provider, MD  gabapentin (NEURONTIN) 300 MG capsule TAKE 1 CAPSULE BY MOUTH EVERY NIGHT AT BEDTIME 08/10/13   Richard Ralene CorkSikora, DPM  glimepiride (AMARYL) 4 MG tablet Take 2 mg by mouth 2 (two) times daily before a meal.    Historical Provider, MD  insulin glargine (LANTUS) 100 UNIT/ML injection Inject 40 Units into the skin at bedtime.    Historical Provider, MD  labetalol (NORMODYNE) 100 MG tablet  12/03/11   Historical Provider, MD  metFORMIN (GLUCOPHAGE) 1000 MG tablet Take 1,000 mg by mouth daily with breakfast.    Historical Provider, MD  PATANOL 0.1 % ophthalmic solution  01/10/14   Historical Provider, MD   prednisoLONE acetate (PRED FORTE) 1 % ophthalmic suspension Place 1 drop into both eyes 2 (two) times daily.    Historical Provider, MD  saxagliptin HCl (ONGLYZA) 5 MG TABS tablet Take 5 mg by mouth daily.    Historical Provider, MD  torsemide (DEMADEX) 20 MG tablet Take 10 mg by mouth daily.    Historical Provider, MD   BP 145/75  Pulse 109  Temp(Src) 99.4 F (37.4 C) (Oral)  Resp 20  SpO2 95% Physical Exam  Nursing note and vitals reviewed. Constitutional: She appears well-developed and well-nourished. No distress.  Morbidly obese African American female appears to be in no acute distress  HENT:  Head: Atraumatic.  Eyes: Conjunctivae are normal.  Neck: Neck supple.  Cardiovascular: Normal rate and regular rhythm.   Pulmonary/Chest: Effort normal and breath sounds normal.  Abdominal: Soft. There is no tenderness.  Genitourinary:  Chaperone present:  Mild erythema noted to bilateral inguinal region.  Inguinal lymphadenopathy noted.  R inguinal fold with moderate tenderness to palpation and small amount of pustular discharge were expressed with applied pressure.  No obvious fluctuance noted but difficulty to appreciate due to large body habitus.    Neurological: She is alert.  Skin: Rash (See GU) noted.  Psychiatric: She has a normal mood and affect.    ED Course  Procedures (including critical care time)  7:10 PM Diabetic patients with right groin infection, suspect right groin abscess. Abscess is difficult to fully evaluate due to large body habitus. Patient has prior infection requiring ICU stay due to sepsis. Therefore, will obtain abdominal and pelvis CT scan for further evaluation. No signs of sepsis at this time. Care discussed him with Dr. Estell HarpinZammit.  7:40 PM Patient does have an elevated lactic acid of 3.62. Normal WBC. Will culture wound, starts antibiotic, vancomycin, and patient will likely be admitted for further management.  8:01 PM Care discussed with oncoming  provider who will continue to monitor pt and determine further care.    Labs Review Labs Reviewed  CBC WITH DIFFERENTIAL - Abnormal; Notable for the following:    Hemoglobin 10.7 (*)    HCT 33.4 (*)    RDW 16.6 (*)    All other components within normal limits  I-STAT CG4 LACTIC ACID, ED - Abnormal; Notable for the following:    Lactic Acid, Venous 3.62 (*)    All other components within normal limits  CBG MONITORING, ED - Abnormal; Notable for the following:    Glucose-Capillary 275 (*)    All other components within normal limits  CULTURE, BLOOD (ROUTINE X 2)  CULTURE, BLOOD (ROUTINE X 2)  WOUND CULTURE  BASIC METABOLIC PANEL    Imaging Review No results found.   EKG Interpretation None      MDM  Final diagnoses:  None    BP 140/73  Pulse 105  Temp(Src) 98.4 F (36.9 C) (Oral)  Resp 15  SpO2 96%     Fayrene HelperBowie Elener Custodio, PA-C 02/13/14 2002

## 2014-02-14 ENCOUNTER — Telehealth (HOSPITAL_BASED_OUTPATIENT_CLINIC_OR_DEPARTMENT_OTHER): Payer: Self-pay

## 2014-02-14 ENCOUNTER — Encounter (HOSPITAL_COMMUNITY): Payer: Self-pay | Admitting: Emergency Medicine

## 2014-02-14 DIAGNOSIS — I10 Essential (primary) hypertension: Secondary | ICD-10-CM | POA: Diagnosis present

## 2014-02-14 DIAGNOSIS — Z6841 Body Mass Index (BMI) 40.0 and over, adult: Secondary | ICD-10-CM

## 2014-02-14 DIAGNOSIS — E785 Hyperlipidemia, unspecified: Secondary | ICD-10-CM | POA: Diagnosis present

## 2014-02-14 DIAGNOSIS — Z7982 Long term (current) use of aspirin: Secondary | ICD-10-CM

## 2014-02-14 DIAGNOSIS — M549 Dorsalgia, unspecified: Secondary | ICD-10-CM | POA: Diagnosis present

## 2014-02-14 DIAGNOSIS — Z833 Family history of diabetes mellitus: Secondary | ICD-10-CM

## 2014-02-14 DIAGNOSIS — E1165 Type 2 diabetes mellitus with hyperglycemia: Secondary | ICD-10-CM | POA: Diagnosis present

## 2014-02-14 DIAGNOSIS — Z87891 Personal history of nicotine dependence: Secondary | ICD-10-CM

## 2014-02-14 DIAGNOSIS — I739 Peripheral vascular disease, unspecified: Secondary | ICD-10-CM | POA: Diagnosis present

## 2014-02-14 DIAGNOSIS — L03319 Cellulitis of trunk, unspecified: Principal | ICD-10-CM

## 2014-02-14 DIAGNOSIS — Z794 Long term (current) use of insulin: Secondary | ICD-10-CM

## 2014-02-14 DIAGNOSIS — K219 Gastro-esophageal reflux disease without esophagitis: Secondary | ICD-10-CM | POA: Diagnosis present

## 2014-02-14 DIAGNOSIS — E1169 Type 2 diabetes mellitus with other specified complication: Secondary | ICD-10-CM

## 2014-02-14 DIAGNOSIS — L02219 Cutaneous abscess of trunk, unspecified: Principal | ICD-10-CM | POA: Diagnosis present

## 2014-02-14 DIAGNOSIS — G8929 Other chronic pain: Secondary | ICD-10-CM | POA: Diagnosis present

## 2014-02-14 DIAGNOSIS — IMO0002 Reserved for concepts with insufficient information to code with codable children: Secondary | ICD-10-CM | POA: Diagnosis present

## 2014-02-14 DIAGNOSIS — M19049 Primary osteoarthritis, unspecified hand: Secondary | ICD-10-CM | POA: Diagnosis present

## 2014-02-14 DIAGNOSIS — M25519 Pain in unspecified shoulder: Secondary | ICD-10-CM | POA: Diagnosis present

## 2014-02-14 DIAGNOSIS — H548 Legal blindness, as defined in USA: Secondary | ICD-10-CM | POA: Diagnosis present

## 2014-02-14 DIAGNOSIS — E11319 Type 2 diabetes mellitus with unspecified diabetic retinopathy without macular edema: Secondary | ICD-10-CM | POA: Diagnosis present

## 2014-02-14 DIAGNOSIS — E1139 Type 2 diabetes mellitus with other diabetic ophthalmic complication: Secondary | ICD-10-CM | POA: Diagnosis present

## 2014-02-14 LAB — CBG MONITORING, ED: Glucose-Capillary: 357 mg/dL — ABNORMAL HIGH (ref 70–99)

## 2014-02-14 LAB — SEDIMENTATION RATE: Sed Rate: 64 mm/hr — ABNORMAL HIGH (ref 0–22)

## 2014-02-14 NOTE — ED Notes (Signed)
No adverse reaction to the medication.  Patient is ready for discharge.  Family verbalized understanding of discharge instructions

## 2014-02-14 NOTE — Telephone Encounter (Signed)
Chart reviewed by R. Browning PA "Have patient return to ED now for re-evaluation."  02/14/2014 @ 2155 Pt called and asked to return to the ED for re eval.  Pt voiced understanding.  Pt daughter called back and FM reviewed information with her as well.

## 2014-02-14 NOTE — ED Provider Notes (Signed)
Medical screening examination/treatment/procedure(s) were performed by non-physician practitioner and as supervising physician I was immediately available for consultation/collaboration.   EKG Interpretation None        Prabhjot Maddux T Chiara Coltrin, MD 02/14/14 1208 

## 2014-02-14 NOTE — ED Notes (Signed)
Patient here after being called by hospital personnel to inform of positive blood cultures. Was seen yesterday for skin rash with drainage in right groin area. History of sepsis due to similar issue.

## 2014-02-14 NOTE — Telephone Encounter (Signed)
Call rcvd from [facility] (+) prelim result 1 aerobic bottle(4 total) -> gram (+) cocci in clusters.  Pt dcd w/Rx for doxycycline.  Chart to MD for review.

## 2014-02-14 NOTE — ED Provider Notes (Signed)
Medical screening examination/treatment/procedure(s) were conducted as a shared visit with non-physician practitioner(s) and myself.  I personally evaluated the patient during the encounter.   EKG Interpretation None      Pt with groin pain,  pe abscess right groin  Catherine LennertJoseph L Eduin Friedel, MD 02/14/14 1443

## 2014-02-15 ENCOUNTER — Inpatient Hospital Stay (HOSPITAL_COMMUNITY)
Admission: EM | Admit: 2014-02-15 | Discharge: 2014-02-17 | DRG: 603 | Disposition: A | Discharge status: Home Health | Payer: Medicare HMO | Attending: Internal Medicine | Admitting: Internal Medicine

## 2014-02-15 DIAGNOSIS — A419 Sepsis, unspecified organism: Secondary | ICD-10-CM | POA: Diagnosis present

## 2014-02-15 DIAGNOSIS — L03314 Cellulitis of groin: Secondary | ICD-10-CM | POA: Diagnosis present

## 2014-02-15 LAB — PREALBUMIN: Prealbumin: 20.2 mg/dL (ref 17.0–34.0)

## 2014-02-15 LAB — COMPREHENSIVE METABOLIC PANEL
ALBUMIN: 3 g/dL — AB (ref 3.5–5.2)
ALK PHOS: 80 U/L (ref 39–117)
ALT: 8 U/L (ref 0–35)
AST: 11 U/L (ref 0–37)
Anion gap: 13 (ref 5–15)
BUN: 24 mg/dL — ABNORMAL HIGH (ref 6–23)
CHLORIDE: 102 meq/L (ref 96–112)
CO2: 25 mEq/L (ref 19–32)
Calcium: 10.5 mg/dL (ref 8.4–10.5)
Creatinine, Ser: 1.18 mg/dL — ABNORMAL HIGH (ref 0.50–1.10)
GFR calc Af Amer: 53 mL/min — ABNORMAL LOW (ref >90)
GFR calc non Af Amer: 46 mL/min — ABNORMAL LOW (ref >90)
Glucose, Bld: 322 mg/dL — ABNORMAL HIGH (ref 70–99)
POTASSIUM: 4.6 meq/L (ref 3.7–5.3)
Sodium: 140 mEq/L (ref 137–147)
Total Protein: 6.8 g/dL (ref 6.0–8.3)

## 2014-02-15 LAB — CBC WITH DIFFERENTIAL/PLATELET
Basophils Absolute: 0 10*3/uL (ref 0.0–0.1)
Basophils Relative: 0 % (ref 0–1)
Eosinophils Absolute: 0.2 10*3/uL (ref 0.0–0.7)
Eosinophils Relative: 3 % (ref 0–5)
HCT: 31.6 % — ABNORMAL LOW (ref 36.0–46.0)
HEMOGLOBIN: 10 g/dL — AB (ref 12.0–15.0)
Lymphocytes Relative: 37 % (ref 12–46)
Lymphs Abs: 2.1 10*3/uL (ref 0.7–4.0)
MCH: 26.7 pg (ref 26.0–34.0)
MCHC: 31.6 g/dL (ref 30.0–36.0)
MCV: 84.3 fL (ref 78.0–100.0)
MONOS PCT: 4 % (ref 3–12)
Monocytes Absolute: 0.2 10*3/uL (ref 0.1–1.0)
NEUTROS ABS: 3.2 10*3/uL (ref 1.7–7.7)
NEUTROS PCT: 56 % (ref 43–77)
Platelets: 243 10*3/uL (ref 150–400)
RBC: 3.75 MIL/uL — AB (ref 3.87–5.11)
RDW: 16.3 % — ABNORMAL HIGH (ref 11.5–15.5)
WBC: 5.8 10*3/uL (ref 4.0–10.5)

## 2014-02-15 LAB — URINALYSIS, ROUTINE W REFLEX MICROSCOPIC
BILIRUBIN URINE: NEGATIVE
Ketones, ur: NEGATIVE mg/dL
Nitrite: NEGATIVE
PH: 5 (ref 5.0–8.0)
Protein, ur: NEGATIVE mg/dL
SPECIFIC GRAVITY, URINE: 1.034 — AB (ref 1.005–1.030)
Urobilinogen, UA: 0.2 mg/dL (ref 0.0–1.0)

## 2014-02-15 LAB — URINE MICROSCOPIC-ADD ON

## 2014-02-15 LAB — GLUCOSE, CAPILLARY
GLUCOSE-CAPILLARY: 132 mg/dL — AB (ref 70–99)
Glucose-Capillary: 135 mg/dL — ABNORMAL HIGH (ref 70–99)
Glucose-Capillary: 175 mg/dL — ABNORMAL HIGH (ref 70–99)
Glucose-Capillary: 163 mg/dL — ABNORMAL HIGH (ref 70–99)

## 2014-02-15 LAB — I-STAT CG4 LACTIC ACID, ED: Lactic Acid, Venous: 2.16 mmol/L (ref 0.5–2.2)

## 2014-02-15 MED ORDER — ADULT MULTIVITAMIN W/MINERALS CH
1.0000 | ORAL_TABLET | Freq: Every day | ORAL | Status: DC
  Administered 2014-02-15 – 2014-02-17 (×3): 1 via ORAL
  Filled 2014-02-15 (×3): qty 1

## 2014-02-15 MED ORDER — METFORMIN HCL 500 MG PO TABS
1000.0000 mg | ORAL_TABLET | Freq: Two times a day (BID) | ORAL | Status: DC
  Administered 2014-02-16 – 2014-02-17 (×3): 1000 mg via ORAL
  Filled 2014-02-15 (×7): qty 2

## 2014-02-15 MED ORDER — ONDANSETRON HCL 4 MG PO TABS: 4.0000 mg | ORAL_TABLET | Freq: Four times a day (QID) | ORAL | Status: DC | PRN

## 2014-02-15 MED ORDER — ASPIRIN EC 81 MG PO TBEC
81.0000 mg | DELAYED_RELEASE_TABLET | Freq: Every day | ORAL | Status: DC
  Administered 2014-02-15 – 2014-02-17 (×3): 81 mg via ORAL
  Filled 2014-02-15 (×3): qty 1

## 2014-02-15 MED ORDER — INSULIN ASPART 100 UNIT/ML ~~LOC~~ SOLN
4.0000 [IU] | Freq: Three times a day (TID) | SUBCUTANEOUS | Status: DC
  Administered 2014-02-15 – 2014-02-17 (×6): 4 [IU] via SUBCUTANEOUS

## 2014-02-15 MED ORDER — IRBESARTAN 300 MG PO TABS
300.0000 mg | ORAL_TABLET | Freq: Every day | ORAL | Status: DC
  Administered 2014-02-15 – 2014-02-17 (×3): 300 mg via ORAL
  Filled 2014-02-15 (×3): qty 1

## 2014-02-15 MED ORDER — PIPERACILLIN-TAZOBACTAM 3.375 G IVPB
3.3750 g | Freq: Three times a day (TID) | INTRAVENOUS | Status: DC
  Administered 2014-02-15 – 2014-02-17 (×7): 3.375 g via INTRAVENOUS
  Filled 2014-02-15 (×8): qty 50

## 2014-02-15 MED ORDER — SODIUM CHLORIDE 0.9 % IV BOLUS (SEPSIS)
500.0000 mL | Freq: Once | INTRAVENOUS | Status: AC
  Administered 2014-02-15: 500 mL via INTRAVENOUS

## 2014-02-15 MED ORDER — HYDROCODONE-ACETAMINOPHEN 5-325 MG PO TABS
1.0000 | ORAL_TABLET | ORAL | Status: DC | PRN
  Filled 2014-02-15: qty 1

## 2014-02-15 MED ORDER — ONDANSETRON HCL 4 MG/2ML IJ SOLN: 4.0000 mg | Freq: Four times a day (QID) | INTRAMUSCULAR | Status: DC | PRN

## 2014-02-15 MED ORDER — CILOSTAZOL 100 MG PO TABS
100.0000 mg | ORAL_TABLET | Freq: Every morning | ORAL | Status: DC
  Administered 2014-02-15 – 2014-02-17 (×3): 100 mg via ORAL
  Filled 2014-02-15 (×3): qty 1

## 2014-02-15 MED ORDER — LABETALOL HCL 100 MG PO TABS
100.0000 mg | ORAL_TABLET | Freq: Two times a day (BID) | ORAL | Status: DC
  Administered 2014-02-15 – 2014-02-17 (×5): 100 mg via ORAL
  Filled 2014-02-15 (×8): qty 1

## 2014-02-15 MED ORDER — PIPERACILLIN-TAZOBACTAM 3.375 G IVPB 30 MIN
3.3750 g | Freq: Once | INTRAVENOUS | Status: AC
  Administered 2014-02-15: 3.375 g via INTRAVENOUS
  Filled 2014-02-15: qty 50

## 2014-02-15 MED ORDER — SODIUM CHLORIDE 0.9 % IJ SOLN
3.0000 mL | Freq: Two times a day (BID) | INTRAMUSCULAR | Status: DC
  Administered 2014-02-15: 3 mL via INTRAVENOUS
  Administered 2014-02-16: 10:00:00 via INTRAVENOUS
  Administered 2014-02-17: 3 mL via INTRAVENOUS

## 2014-02-15 MED ORDER — DOCUSATE SODIUM 100 MG PO CAPS
100.0000 mg | ORAL_CAPSULE | Freq: Two times a day (BID) | ORAL | Status: DC
  Administered 2014-02-16 – 2014-02-17 (×3): 100 mg via ORAL
  Filled 2014-02-15 (×4): qty 1

## 2014-02-15 MED ORDER — DOCUSATE SODIUM 100 MG PO CAPS
100.0000 mg | ORAL_CAPSULE | Freq: Two times a day (BID) | ORAL | Status: DC
  Administered 2014-02-15 (×2): 100 mg via ORAL

## 2014-02-15 MED ORDER — INSULIN GLARGINE 100 UNIT/ML ~~LOC~~ SOLN
40.0000 [IU] | Freq: Every day | SUBCUTANEOUS | Status: DC
  Administered 2014-02-15 – 2014-02-16 (×2): 40 [IU] via SUBCUTANEOUS
  Filled 2014-02-15 (×3): qty 0.4

## 2014-02-15 MED ORDER — VANCOMYCIN HCL 10 G IV SOLR
1500.0000 mg | Freq: Two times a day (BID) | INTRAVENOUS | Status: DC
  Administered 2014-02-15 – 2014-02-16 (×4): 1500 mg via INTRAVENOUS
  Filled 2014-02-15 (×6): qty 1500

## 2014-02-15 MED ORDER — ATORVASTATIN CALCIUM 20 MG PO TABS
20.0000 mg | ORAL_TABLET | Freq: Every day | ORAL | Status: DC
  Administered 2014-02-15: 20 mg via ORAL
  Filled 2014-02-15 (×3): qty 1

## 2014-02-15 MED ORDER — ACETAMINOPHEN 325 MG PO TABS: 650.0000 mg | ORAL_TABLET | Freq: Four times a day (QID) | ORAL | Status: DC | PRN

## 2014-02-15 MED ORDER — VANCOMYCIN HCL IN DEXTROSE 1-5 GM/200ML-% IV SOLN
1000.0000 mg | Freq: Once | INTRAVENOUS | Status: AC
  Administered 2014-02-15: 1000 mg via INTRAVENOUS
  Filled 2014-02-15: qty 200

## 2014-02-15 MED ORDER — AMLODIPINE BESYLATE 10 MG PO TABS
10.0000 mg | ORAL_TABLET | Freq: Every day | ORAL | Status: DC
  Administered 2014-02-15 – 2014-02-17 (×3): 10 mg via ORAL
  Filled 2014-02-15 (×3): qty 1

## 2014-02-15 MED ORDER — INSULIN ASPART 100 UNIT/ML ~~LOC~~ SOLN
0.0000 [IU] | Freq: Three times a day (TID) | SUBCUTANEOUS | Status: DC
  Administered 2014-02-15: 4 [IU] via SUBCUTANEOUS
  Administered 2014-02-15 (×2): 3 [IU] via SUBCUTANEOUS
  Administered 2014-02-16: 4 [IU] via SUBCUTANEOUS
  Administered 2014-02-16: 11 [IU] via SUBCUTANEOUS
  Administered 2014-02-17: 4 [IU] via SUBCUTANEOUS

## 2014-02-15 MED ORDER — LINAGLIPTIN 5 MG PO TABS
5.0000 mg | ORAL_TABLET | Freq: Every day | ORAL | Status: DC
  Administered 2014-02-15 – 2014-02-17 (×3): 5 mg via ORAL
  Filled 2014-02-15 (×3): qty 1

## 2014-02-15 MED ORDER — GABAPENTIN 300 MG PO CAPS
300.0000 mg | ORAL_CAPSULE | Freq: Every day | ORAL | Status: DC
  Administered 2014-02-15 – 2014-02-16 (×2): 300 mg via ORAL
  Filled 2014-02-15 (×4): qty 1

## 2014-02-15 MED ORDER — SODIUM CHLORIDE 0.9 % IJ SOLN: 3.0000 mL | INTRAMUSCULAR | Status: DC | PRN

## 2014-02-15 MED ORDER — GLIMEPIRIDE 2 MG PO TABS
2.0000 mg | ORAL_TABLET | Freq: Two times a day (BID) | ORAL | Status: DC
  Administered 2014-02-15 – 2014-02-17 (×5): 2 mg via ORAL
  Filled 2014-02-15 (×8): qty 1

## 2014-02-15 MED ORDER — POLYETHYLENE GLYCOL 3350 17 G PO PACK: 17.0000 g | PACK | Freq: Every day | ORAL | Status: DC | PRN

## 2014-02-15 MED ORDER — ENOXAPARIN SODIUM 40 MG/0.4ML ~~LOC~~ SOLN
40.0000 mg | SUBCUTANEOUS | Status: DC
  Administered 2014-02-15 – 2014-02-17 (×3): 40 mg via SUBCUTANEOUS
  Filled 2014-02-15 (×3): qty 0.4

## 2014-02-15 MED ORDER — FERROUS SULFATE 325 (65 FE) MG PO TABS
325.0000 mg | ORAL_TABLET | Freq: Every day | ORAL | Status: DC
  Administered 2014-02-15 – 2014-02-17 (×3): 325 mg via ORAL
  Filled 2014-02-15 (×3): qty 1

## 2014-02-15 MED ORDER — SODIUM CHLORIDE 0.9 % IV SOLN
250.0000 mL | INTRAVENOUS | Status: DC | PRN
  Administered 2014-02-15: 250 mL via INTRAVENOUS

## 2014-02-15 MED ORDER — ACETAMINOPHEN 650 MG RE SUPP: 650.0000 mg | Freq: Four times a day (QID) | RECTAL | Status: DC | PRN

## 2014-02-15 MED ORDER — ZOLPIDEM TARTRATE 5 MG PO TABS: 5.0000 mg | ORAL_TABLET | Freq: Every evening | ORAL | Status: DC | PRN

## 2014-02-15 MED ORDER — PANTOPRAZOLE SODIUM 40 MG PO TBEC
40.0000 mg | DELAYED_RELEASE_TABLET | Freq: Every day | ORAL | Status: DC
  Administered 2014-02-15 – 2014-02-17 (×3): 40 mg via ORAL
  Filled 2014-02-15 (×3): qty 1

## 2014-02-15 MED ORDER — INSULIN GLARGINE 100 UNIT/ML ~~LOC~~ SOLN: 40.0000 [IU] | Freq: Every day | SUBCUTANEOUS | Status: DC

## 2014-02-15 MED ORDER — TORSEMIDE 10 MG PO TABS
10.0000 mg | ORAL_TABLET | Freq: Every morning | ORAL | Status: DC
  Administered 2014-02-15 – 2014-02-17 (×3): 10 mg via ORAL
  Filled 2014-02-15 (×3): qty 1

## 2014-02-15 NOTE — H&P (Signed)
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  Presented to the ER late Sunday with groin pain and was found to have a cellulitis of her inner thigh and labia.  CT did not show abscess and she did not have an elevated WBC count.  Sent home with oral antibiotics and was scheduled for f/u in my office late yesterday.  AT that time, her family had chosen to not yet fill her abx prescription.  She was examined by my NP, with the exam appended to the bottom of this note.  Abx were again prescribed for her.  Per report, she had 1 of 2 blood cultures positive from Gram positive cocci in clusters and was contacted by the ER for admission.  Review of Systems:  Review of Systems  Constitutional: Positive for fever, chills and diaphoresis.  Respiratory: Negative for chest tightness and shortness of breath.  Cardiovascular: Negative for chest pain.  Gastrointestinal: Negative for nausea, vomiting and abdominal pain.  Musculoskeletal: Negative for back pain, neck pain and neck stiffness.  Skin: Positive for color change, rash and wound.  Neurological: Negative for dizziness, weakness, light-headedness, numbness and headaches.  All other systems reviewed and are negative.  Past Medical History: Past Medical History  Diagnosis Date  . Hypertension   . Diabetes mellitus   . Blind    Past Surgical History  Procedure Laterality Date  . Incision and drainage perirectal abscess  11/20/2011    Procedure: IRRIGATION AND DEBRIDEMENT PERIRECTAL ABSCESS;  Surgeon: Liz MaladyBurke E Thompson, MD;  Location: [facility] OR;  Service: General;  Laterality: Right;  Incision and Drainage of Right Groin Abscess    Medications: Prior to Admission medications   Medication Sig Start Date End Date Taking? Authorizing Provider  amLODipine (NORVASC) 10  MG tablet Take 10 mg by mouth daily.  11/03/13  Yes Historical Provider, MD  aspirin EC 81 MG tablet Take 81 mg by mouth daily.   Yes Historical Provider, MD  CALCIUM PO Take 750 mg by mouth 2 (two) times daily.   Yes Historical Provider, MD  Cholecalciferol 2000 UNITS CAPS Take 1 capsule by mouth every morning.   Yes Historical Provider, MD  cilostazol (PLETAL) 100 MG tablet Take 100 mg by mouth every morning.    Yes Historical Provider, MD  docusate sodium (COLACE) 100 MG capsule Take 100 mg by mouth 2 (two) times daily. Stool softener   Yes Historical Provider, MD  doxycycline (VIBRAMYCIN) 100 MG capsule Take 1 capsule (100 mg total) by mouth 2 (two) times daily. 02/13/14  Yes Antony MaduraKelly Humes, PA-C  esomeprazole (NEXIUM) 20 MG capsule Take 20 mg by mouth every morning.   Yes Historical Provider, MD  gabapentin (NEURONTIN) 300 MG capsule Take 300 mg by mouth at bedtime.   Yes Historical Provider, MD  glimepiride (AMARYL) 4 MG tablet Take 2 mg by mouth 2 (two) times daily before a meal.   Yes Historical Provider, MD  insulin glargine (LANTUS) 100 UNIT/ML injection Inject 40 Units into the skin at bedtime.   Yes Historical Provider, MD  IRON PO Take 1 tablet by mouth every morning.   Yes Historical Provider, MD  labetalol (NORMODYNE) 100 MG tablet Take 100 mg by mouth 2 (two) times daily.  12/03/11  Yes Historical Provider, MD  metFORMIN (GLUCOPHAGE) 1000 MG  tablet Take 2,000 mg by mouth daily with breakfast.    Yes Historical Provider, MD  OVER THE COUNTER MEDICATION Place 1 drop into both eyes 2 (two) times daily. Alcon Allergy eyedrops   Yes Historical Provider, MD  saxagliptin HCl (ONGLYZA) 5 MG TABS tablet Take 5 mg by mouth every morning.    Yes Historical Provider, MD  torsemide (DEMADEX) 20 MG tablet Take 10 mg by mouth every morning.    Yes Historical Provider, MD  valsartan (DIOVAN) 320 MG tablet Take 320 mg by mouth every morning.   Yes Historical Provider, MD    Allergies:   Allergies   Allergen Reactions  . Oxycodone Other (See Comments)    "I'm swimming; I'm floating; I'm diving; I'm in another world."    Social History:  reports that she has quit smoking. She has never used smokeless tobacco. She reports that she does not drink alcohol or use illicit drugs.  Family History: Family History  Problem Relation Age of Onset  . Heart disease Mother   . Diabetes Mother   . Cancer Sister     Breast  . Diabetes Sister   . Cancer Brother     Colon  . Diabetes Brother     Physical Exam: Filed Vitals:   02/15/14 0230 02/15/14 0300 02/15/14 0330 02/15/14 0404  BP: 150/76 167/79 153/68 117/68  Pulse: 87 82 84 80  Temp:    98.2 F (36.8 C)  TempSrc:    Oral  Resp: 17 15 22 20   Height:    5\' 6"  (1.676 m)  Weight:    140.116 kg (308 lb 14.4 oz)  SpO2: 93% 97% 93% 99%   General appearance: alert, cooperative and appears stated age, morbidly obese Head: Normocephalic, without obvious abnormality, atraumatic Eyes: conjunctivae/corneas clear. Legally Blind.  Nose: Nares normal. Septum midline. Mucosa normal. No drainage or sinus tenderness. Throat: lips, mucosa, and tongue normal; teeth and gums normal Neck: no adenopathy, no carotid bruit, no JVD and thyroid not enlarged, symmetric, no tenderness/mass/nodules Resp: clear to auscultation bilaterally Cardio: regular rate and rhythm, S1, S2 normal, no murmur, click, rub or gallop GI: soft, non-tender; bowel sounds normal; no masses,  no organomegaly Extremities: extremities normal, atraumatic, no cyanosis or edema Pulses: 2+ and symmetric Skin: Indurated rash R intertriginoous groin with drainage. Neurologic: Alert and oriented X 3, normal strength and tone. Normal symmetric reflexes.   Labs on Admission:   Recent Labs  02/13/14 1910 02/15/14 0024  NA 142 140  K 4.5 4.6  CL 104 102  CO2 24 25  GLUCOSE 290* 322*  BUN 19 24*  CREATININE 1.21* 1.18*  CALCIUM 10.3 10.5    Recent Labs  02/15/14 0024  AST  11  ALT 8  ALKPHOS 80  BILITOT <0.2*  PROT 6.8  ALBUMIN 3.0*    Recent Labs  02/13/14 1910 02/15/14 0024  WBC 7.1 5.8  NEUTROABS 4.5 3.2  HGB 10.7* 10.0*  HCT 33.4* 31.6*  MCV 83.3 84.3  PLT 253 243    Radiological Exams on Admission: Ct Abdomen Pelvis W Contrast  02/13/2014   CLINICAL DATA:  Right groin pain.  Previous right groin abscess.  EXAM: CT ABDOMEN AND PELVIS WITH CONTRAST  TECHNIQUE: Multidetector CT imaging of the abdomen and pelvis was performed using the standard protocol following bolus administration of intravenous contrast.  CONTRAST:  OMNIPAQUE IOHEXOL 300 MG/ML  SOLN  COMPARISON:  None.  FINDINGS: A moderate size hiatal hernia is seen. The liver,  gallbladder, spleen, pancreas, adrenal glands, and kidneys are normal in appearance. No evidence hydronephrosis. Small uterine fibroids noted. Adnexal regions are unremarkable in appearance. No other soft tissue masses or lymphadenopathy identified within the abdomen or pelvis.  A small paraumbilical hernia is seen containing only fat. No evidence of herniated bowel loops or bowel obstruction.  In the subcutaneous tissues of the right groin and labia, there is inflammatory change, consistent with cellulitis. No abscess is identified. No other intra-abdominal or pelvic inflammatory process identified.  IMPRESSION: Cellulitis involving the subcutaneous tissues of the right groin and labia. No evidence of abscess.  Incidental findings including moderate hiatal hernia, small paraumbilical hernia, and small uterine fibroids.   Electronically Signed   By: Myles Rosenthal M.D.   On: 02/13/2014 22:06   Assessment/Plan Active Problems:   Cellulitis of right groin   Sepsis 1 of 2 blood cultures with gram positive cocci in clusters.  ? comtaminant vs actual sepsis in a patient with a normal WBC count.  She was called per the ER for admission based on this culture and given Vancomycin/Zosyn empirically.  A 2nd set of cultures was drawn  prior to administration of these.  If they remain negative, then the blood culture is contaminant.  Was given doxy in the ER that for some reason she had not had filled prior to being seen in our office late yesterday afternoon.  This falls on her family as Camela is legally blind and relies on them for her meds.  Will also get wound care consult given location of lesion for continued care.  Has pending [facility] outpatient consult, but given the CT from 24 hours ago without abscess, wil hold on consult as without something to drain, antibiotics and wound care are the appropriate treatment. DM2 with triopathy:  Her last A1C was actually one of her better.  She realistically has a goal in the mid 7's as pushing her any lower causes hypoglycemia and she has poor awareness and given her visual deficits, difficulty in taking actions by herself  Will use as opportunity for Diabetes Teaching to work with her family. OA:  Tylenol as pain control, hydrocodone if needed.  Had nausea with oxycodone in the past.  Has had ongoing rotator cuff issues on R as well Nursing will note legal blindness at bedside. HTN: Controlled reasonably well on multiple drug therapy, will continue Hyperlipidemia:  Continue statin PVD: Pletal, ASA GERD  On Dexilant as outpatient, will give hospital formulary equivalent. Nutrition consult:  Low albumin state despite morbid obesity.  ? Proper nutrition per family at home?  TISOVEC,RICHARD W 02/15/2014, 6:18 AM  02/14/14 Office visit note as below  Vital Signs  Entered weight:  304  lbs., Calculated Weight: 304 lbs., ( 137.89 kg) Height: 65 in., ( 165.10 cm) Temperature: 98.3 deg F, Temperature site: oral Pulse rate: 84 Pulse rhythm: regular  Blood Pressure #1: 136 / 74 mm Hg    BMI: 50.59 BSA: 2.36 Wt Chg: 1 lbs since 01/24/2014  Vitals entered by: Jorge Ny, CMA on February 14, 2014 3:47 PM  Pulse Oximetry  O2 Saturation: 96 % History of Present Illness  History from:  patient Reason for visit: See chief complaint Chief Complaint: er f/u for cellulitis   History of Present Illness: Patient went to ER yesterday for right groin cellulitis involving the labia majora and right groin.  Prior I&D in same area per patient in 2013 and with an ER visit/ICU admission for abscess and sepsis.  She states they told her yesterday the wound was draining and recommended outpatient f/u with doxy rx.  She has not had her RX filled yet.  She has been cleaning with sterile saline and trying to keep dry.  She has a depends on.  She does not check her sugars currently because she is out of lancets.  She denies n/v/d/ha/fever.  The area is pretty painful.  She can not get in the bathtub.  She believes the area has been tender for 2 weeks but grand-daughter states not certain.    Review of Systems  General:       Denies fevers, chills, headache, sweats, anorexia, sleep disturbance.   Cardiovascular:       Denies chest pains.   Respiratory:       Denies dyspnea.   Gastrointestinal:       Denies nausea, vomiting, abdominal pain.   Skin:       Complains of see HPI, suspicious lesions.   Past History Past Medical History (reviewed - no changes required): Dm2 Dx 1985, Retinopathy HTN 2002 PAD 2002 Chronic back pain >5 years Legally Blind R shoulder pain, followed by Magn[facility] Ivan OA hands Morbid Obesity   Physical Exam  General appearance: well nourished, well hydrated, no acute distress  Eyes  External: implant  Ears, Nose and Throat  External ears: normal, no lesions or deformities External nose: normal, no lesions or deformities Hearing: grossly intact  Neck  Neck: supple, no masses, trachea midline  Respiratory  Respiratory effort: no intercostal retractions or use of accessory muscles Auscultation: no rales, rhonchi, or wheezes  Cardiovascular  Auscultation: S1, S2, no murmur, rub, or gallop  Genitourinary  External genitalia: scar along the lateral edge of the  right labia majora with medial skin opening drainage serous fluid, indurated about 2x2 surrounding the area, warm and exquisetly tender to touch  Lymphatic  Neck: no cervical adenopathy  Musculoskeletal  Gait and station: normal, aided by son due to vision Spine, ribs, pelvis: normal alignment and mobility, no deformity  Mental Status Exam  Judgment, insight: intact Orientation: oriented to time, place, and person Memory: intact Mood and affect: Normal Mood   Impression & Recommendations:  Problem # 1:  Cellulitis/abscess NOS (ICD-682.9) (ICD10-L08.9) ER yesterday-normal wbcs at 7.1.  She remains afebrile and denies systemic symptoms. CT abd/pevlis shows cellulitis invovling subcutaneous tissues of right groin and labia majora without asbcess formation? Sig area of induration on exam vs scar tissue from previous I&D Exquisetly tender to touch She MUST start her abx tonight.  Discussed warm soaks, compresses, clean and dry Minimal serous drainage expressed Recommed CCS f/u for further evaluation for right groin cellulitis with questionable abscess.  If no better she will need to f/u with the above. Her updated medication list for this problem includes:    Adoxa 100 Mg Oral Tabs (Doxycycline monohydrate) .Marland Kitchen... Twice daily   Problem # 2:  DIABETES MELLITUS, TYPE II, UNCONTROLLED, WITH COMPLICATIONS (ICD-250.92) (ICD10-E11.8) A1C has been stable without lows Morbidly obese Continue current treatment. Needs to refill her lancets and check her bs. Her updated medication list for this problem includes:    Valsartan 320 Mg Tabs (Valsartan) .Marland Kitchen... Take one tablet once daily    Amaryl 4 Mg Tabs (Glimepiride) .Marland Kitchen... 1/2 tab po bid    Aspirin 81 Mg Ec Tab (Aspirin) .Marland Kitchen... Take one (1) tablet by mouth daily    Onglyza 5 Mg Tabs (Saxagliptin hcl) .Marland Kitchen... 1 tablet once daily    Lantus 100  Unit/ml Soln (Insulin glargine) ..... Inject 40 units qd    Metformin Hcl 1000 Mg Tabs (Metformin hcl) .Marland Kitchen....  2 tablets daily  Labs Reviewed: HgBA1c: 8.0 (01/24/2014)   Creat: 1.3 (01/24/2014)     Last Eye Exam: legally blind  (03/19/2012)   Medications Added to Medication List This Visit: 1)  Adoxa 100 Mg Oral Tabs (Doxycycline monohydrate) .... Twice daily

## 2014-02-15 NOTE — Consult Note (Signed)
WOC wound consult note Reason for Consult: evaluation of the right groin.  Pt with history of right groin abscess in 2013 with surgical intervention at that time.  Pt reports pain and drainage over one week.  Wound type: wound right groin unclear etiology Pressure Ulcer POA: No Measurement: unable to appreciate open area, however she is so tender this makes palpation difficult Wound bed: she is indurated with erythema and drainage on my glove, due to her tenderness was not able to express any further drainage.  Periwound: Dressing procedure/placement/frequency: No topical care recommended at this time due to lack of true open area and also location makes dressing difficult to secure.  Would recommend sitz baths for hygienic reasons if possible or either cleaning with spray shower head and drying completely.   Surgery consult may be warranted if area continues to drain and be as tender as it current is.   Discussed POC with patient and bedside nurse.  Re consult if needed, will not follow at this time. Thanks  Harmoney Sienkiewicz [employer] RN, CWOCN [phone])

## 2014-02-15 NOTE — ED Notes (Signed)
Report to Sealed Air CorporationFrancis RN.  Pt to go to 6N01.

## 2014-02-15 NOTE — ED Notes (Signed)
pt onto bedpan with assist x 2

## 2014-02-15 NOTE — Progress Notes (Signed)
Inpatient Diabetes Program Recommendations  AACE/ADA: New Consensus Statement on Inpatient Glycemic Control (2013)  Target Ranges:  Prepandial:   less than 140 mg/dL      Peak postprandial:   less than 180 mg/dL (1-2 hours)      Critically ill patients:  140 - 180 mg/dL     Results for [name], [name] (MRN [id number]) as of 02/15/2014 10:30  Ref. Range 02/13/2014 19:01 02/14/2014 23:18  Glucose-Capillary Latest Range: 70-99 mg/dL 981275 (H) 191357 (H)    Results for [name], [name] (MRN [id number]) as of 02/15/2014 10:30  Ref. Range 02/15/2014 07:42  Glucose-Capillary Latest Range: 70-99 mg/dL 308132 (H)     Admitted with groin cellulitis/abscess.  Patient has history of Type 2 diabetes x 30 years.  [facility].  Last A1c per Dr. Deneen Hartsisovec's note was: HgBA1c: 8.0 (01/24/2014)  Home DM Meds:  Lantus 40 units QHS Metformin 1000 mg bid Amaryl 2 mg bid Onglyza 5 mg daily   No family present in room during my visit.  Patient unsure when family will arrive today.  Spoke with patient about her routine at home.  Patient told me she is able to get up and go to the bathroom and kitchen but that is the extent of the exercise she gets throughout the day.  Patient stated she knows she needs to move more but just doesn't feel like it.  Patient told me her family (son, daughter-in-law, and two oldest granddaughters give her her medicine during the day).  Patient told me she has occasional episodes of hypoglycemia and can recognize the signs and symptoms.  Patient stated her family usually leaves her a hypoglycemia treatment within reach just in case she needs it when she is home by herself.  Patient told me she knows that she sometimes eats things she shouldn't eat.  For example, patient told me her oldest granddaughter is in [employer], maintenance (IT)culinary school and frequently asks her to taste her cooking at home.  Patient told me she limits dessert to once per week (usually  Sunday) and only drinks H20, hot tea, and coffee at home.  Patient stated she usually does not snack and tries to eat small to medium portion sizes at meals.  Praised patient for her efforts to eat healthfully at home.  Will attempt to visit with patient again when her family is here and see if they have any DM care questions.   Will follow Ambrose FinlandJeannine Johnston Stevana Dufner RN, MSN, CDE Diabetes Coordinator Inpatient Diabetes Program Team Pager: [phone] (8a-10p)

## 2014-02-15 NOTE — Progress Notes (Signed)
ANTIBIOTIC CONSULT NOTE - INITIAL  Pharmacy Consult for Vancocin and Zosyn Indication: bacteremia and groin cellulitis  Allergies  Allergen Reactions  . Oxycodone Other (See Comments)    "I'm swimming; I'm floating; I'm diving; I'm in another world."    Patient Measurements: Height: 5\' 6"  (167.6 cm) Weight: 308 lb 14.4 oz (140.116 kg) IBW/kg (Calculated) : 59.3  Vital Signs: Temp: 98.2 F (36.8 C) (07/07 0404) Temp src: Oral (07/07 0404) BP: 117/68 mmHg (07/07 0404) Pulse Rate: 80 (07/07 0404)  Labs:  Recent Labs  02/13/14 1910 02/15/14 0024  WBC 7.1 5.8  HGB 10.7* 10.0*  PLT 253 243  CREATININE 1.21* 1.18*   Estimated Creatinine Clearance: 64.2 ml/min (by C-G formula based on Cr of 1.18).   Microbiology: Recent Results (from the past 720 hour(s))  WOUND CULTURE     Status: None   Collection Time    02/13/14  7:43 PM      Result Value Ref Range Status   Specimen Description WOUND   Final   Special Requests RIGHT INGUINAL REGION   Final   Gram Stain     Final   Value: RARE WBC PRESENT,BOTH PMN AND MONONUCLEAR     NO SQUAMOUS EPITHELIAL CELLS SEEN     MODERATE GRAM POSITIVE COCCI IN PAIRS     IN CLUSTERS MODERATE GRAM NEGATIVE RODS     RARE GRAM POSITIVE RODS   Culture     Final   Value: NO GROWTH     Performed at [facility]   Report Status PENDING   Incomplete  CULTURE, BLOOD (ROUTINE X 2)     Status: None   Collection Time    02/13/14  8:21 PM      Result Value Ref Range Status   Specimen Description BLOOD LEFT HAND   Final   Special Requests BOTTLES DRAWN AEROBIC ONLY [id number]   Final   Culture  Setup Time     Final   Value: 02/14/2014 00:52     Performed at [facility]   Culture     Final   Value: GRAM POSITIVE COCCI IN CLUSTERS     Note: Gram Stain Report Called to,Read Back By and Verified With: PATTI AT 2044 02/14/14 BY SNOLO     Performed at [facility]   Report Status PENDING   Incomplete    Medical History: Past  Medical History  Diagnosis Date  . Hypertension   . Diabetes mellitus   . Blind     Medications:  Prescriptions prior to admission  Medication Sig Dispense Refill  . amLODipine (NORVASC) 10 MG tablet Take 10 mg by mouth daily.       Marland Kitchen. aspirin EC 81 MG tablet Take 81 mg by mouth daily.      Marland Kitchen. CALCIUM PO Take 750 mg by mouth 2 (two) times daily.      . Cholecalciferol 2000 UNITS CAPS Take 1 capsule by mouth every morning.      . cilostazol (PLETAL) 100 MG tablet Take 100 mg by mouth every morning.       . docusate sodium (COLACE) 100 MG capsule Take 100 mg by mouth 2 (two) times daily. Stool softener      . doxycycline (VIBRAMYCIN) 100 MG capsule Take 1 capsule (100 mg total) by mouth 2 (two) times daily.  20 capsule  0  . esomeprazole (NEXIUM) 20 MG capsule Take 20 mg by mouth every morning.      . gabapentin (  NEURONTIN) 300 MG capsule Take 300 mg by mouth at bedtime.      Marland Kitchen. glimepiride (AMARYL) 4 MG tablet Take 2 mg by mouth 2 (two) times daily before a meal.      . insulin glargine (LANTUS) 100 UNIT/ML injection Inject 40 Units into the skin at bedtime.      . IRON PO Take 1 tablet by mouth every morning.      . labetalol (NORMODYNE) 100 MG tablet Take 100 mg by mouth 2 (two) times daily.       . metFORMIN (GLUCOPHAGE) 1000 MG tablet Take 2,000 mg by mouth daily with breakfast.       . OVER THE COUNTER MEDICATION Place 1 drop into both eyes 2 (two) times daily. Alcon Allergy eyedrops      . saxagliptin HCl (ONGLYZA) 5 MG TABS tablet Take 5 mg by mouth every morning.       . torsemide (DEMADEX) 20 MG tablet Take 10 mg by mouth every morning.       . valsartan (DIOVAN) 320 MG tablet Take 320 mg by mouth every morning.       Scheduled:  . amLODipine  10 mg Oral Daily  . aspirin EC  81 mg Oral Daily  . atorvastatin  20 mg Oral q1800  . cilostazol  100 mg Oral q morning - 10a  . docusate sodium  100 mg Oral BID  . docusate sodium  100 mg Oral BID  . enoxaparin (LOVENOX) injection  40  mg Subcutaneous Q24H  . ferrous sulfate  325 mg Oral Daily  . gabapentin  300 mg Oral QHS  . glimepiride  2 mg Oral BID AC  . insulin aspart  0-20 Units Subcutaneous TID WC  . insulin aspart  4 Units Subcutaneous TID WC  . insulin glargine  40 Units Subcutaneous QHS  . irbesartan  300 mg Oral Daily  . labetalol  100 mg Oral BID  . linagliptin  5 mg Oral Daily  . metFORMIN  1,000 mg Oral BID WC  . multivitamin with minerals  1 tablet Oral Daily  . pantoprazole  40 mg Oral Daily  . piperacillin-tazobactam (ZOSYN)  IV  3.375 g Intravenous Q8H  . sodium chloride  3 mL Intravenous Q12H  . torsemide  10 mg Oral q morning - 10a  . vancomycin  1,500 mg Intravenous Q12H    Assessment: 68yo female was seen yesterday in ED for drainage in groin area, h/o sepsis d/t same, was brought back in today d/t positive blood Cx w/ GPC in clusters, to begin IV ABX.  Goal of Therapy:  Vancomycin trough level 15-20 mcg/ml  Plan:  Rec'd vanc 1g and Zosyn 3.375g in ED; will continue with vancomycin 1500mg  IV Q12H and Zosyn 3.375g IV Q8H and monitor CBC, Cx, levels prn.  Vernard GamblesVeronda Rutger Salton, PharmD, BCPS  02/15/2014,6:44 AM

## 2014-02-15 NOTE — ED Provider Notes (Signed)
CSN: [id number]     Arrival date & time 02/14/14  2304 History   First MD Initiated Contact with Patient 02/15/14 0018     Chief Complaint  Patient presents with  . Abnormal Lab    Positive Blood Cultures from Visit Yesterday.      (Consider location/radiation/quality/duration/timing/severity/associated sxs/prior Treatment) HPI Patient with a history of hypertension diabetes was seen last night and diagnosed with a cellulitis of the right groin area. She was given IV dose of vancomycin. Patient discharged home with doxycycline and follow with her primary Dr. Patient was called back this evening for a positive blood culture which showed gram-positive cocci in clusters. Patient states that she's had subjective fevers and chills all day today including diaphoresis. She continues to have pain to the right groin but states that is now draining. Her blood sugar has been difficult to control. Past Medical History  Diagnosis Date  . Hypertension   . Diabetes mellitus   . Blind    Past Surgical History  Procedure Laterality Date  . Incision and drainage perirectal abscess  11/20/2011    Procedure: IRRIGATION AND DEBRIDEMENT PERIRECTAL ABSCESS;  Surgeon: Liz MaladyBurke E Thompson, MD;  [facility];  Service: General;  Laterality: Right;  Incision and Drainage of Right Groin Abscess   Family History  Problem Relation Age of Onset  . Heart disease Mother   . Diabetes Mother   . Cancer Sister     Breast  . Diabetes Sister   . Cancer Brother     Colon  . Diabetes Brother    History  Substance Use Topics  . Smoking status: Former Games developermoker  . Smokeless tobacco: Never Used  . Alcohol Use: No   OB History   Grav Para Term Preterm Abortions TAB SAB Ect Mult Living                 Review of Systems  Constitutional: Positive for fever, chills and diaphoresis.  Respiratory: Negative for chest tightness and shortness of breath.   Cardiovascular: Negative for chest pain.  Gastrointestinal: Negative  for nausea, vomiting and abdominal pain.  Musculoskeletal: Negative for back pain, neck pain and neck stiffness.  Skin: Positive for color change, rash and wound.  Neurological: Negative for dizziness, weakness, light-headedness, numbness and headaches.  All other systems reviewed and are negative.     Allergies  Oxycodone  Home Medications   Prior to Admission medications   Medication Sig Start Date End Date Taking? Authorizing Provider  amLODipine (NORVASC) 10 MG tablet Take 10 mg by mouth daily.  11/03/13   Historical Provider, MD  aspirin EC 81 MG tablet Take 81 mg by mouth daily.    Historical Provider, MD  CALCIUM PO Take 750 mg by mouth 2 (two) times daily.    Historical Provider, MD  Cholecalciferol 2000 UNITS CAPS Take 1 capsule by mouth every morning.    Historical Provider, MD  cilostazol (PLETAL) 100 MG tablet Take 100 mg by mouth every morning.     Historical Provider, MD  docusate sodium (COLACE) 100 MG capsule Take 100 mg by mouth 2 (two) times daily. Stool softener    Historical Provider, MD  doxycycline (VIBRAMYCIN) 100 MG capsule Take 1 capsule (100 mg total) by mouth 2 (two) times daily. 02/13/14   Antony MaduraKelly Humes, PA-C  esomeprazole (NEXIUM) 20 MG capsule Take 20 mg by mouth every morning.    Historical Provider, MD  gabapentin (NEURONTIN) 300 MG capsule Take 300 mg by mouth at  bedtime.    Historical Provider, MD  glimepiride (AMARYL) 4 MG tablet Take 2 mg by mouth 2 (two) times daily before a meal.    Historical Provider, MD  insulin glargine (LANTUS) 100 UNIT/ML injection Inject 40 Units into the skin at bedtime.    Historical Provider, MD  IRON PO Take 1 tablet by mouth every morning.    Historical Provider, MD  labetalol (NORMODYNE) 100 MG tablet Take 100 mg by mouth 2 (two) times daily.  12/03/11   Historical Provider, MD  metFORMIN (GLUCOPHAGE) 1000 MG tablet Take 2,000 mg by mouth daily with breakfast.     Historical Provider, MD  OVER THE COUNTER MEDICATION Place 1  drop into both eyes 2 (two) times daily. Alcon Allergy eyedrops    Historical Provider, MD  saxagliptin HCl (ONGLYZA) 5 MG TABS tablet Take 5 mg by mouth every morning.     Historical Provider, MD  torsemide (DEMADEX) 20 MG tablet Take 10 mg by mouth every morning.     Historical Provider, MD  valsartan (DIOVAN) 320 MG tablet Take 320 mg by mouth every morning.    Historical Provider, MD   BP 123/60  Pulse 103  Temp(Src) 99 F (37.2 C) (Oral)  Resp 20  SpO2 95% Physical Exam  Nursing note and vitals reviewed. Constitutional: She is oriented to person, place, and time. She appears well-developed and well-nourished. No distress.  HENT:  Head: Normocephalic and atraumatic.  Mouth/Throat: Oropharynx is clear and moist.  Eyes: EOM are normal. Pupils are equal, round, and reactive to light.  Patient is blind  Neck: Normal range of motion. Neck supple.  Cardiovascular: Normal rate and regular rhythm.   Pulmonary/Chest: Effort normal and breath sounds normal. No respiratory distress. She has no wheezes. She has no rales. She exhibits no tenderness.  Abdominal: Soft. Bowel sounds are normal. She exhibits no distension and no mass. There is no tenderness. There is no rebound and no guarding.  Musculoskeletal: Normal range of motion. She exhibits no edema and no tenderness.  Neurological: She is alert and oriented to person, place, and time.  Moves all extremities without deficit. Sensation grossly intact.  Skin: Skin is warm and dry. No rash noted. There is erythema.  Erythematous, indurated rash to the right intertriginous groin. Appears to be draining purulent material. No palpable abscess  Psychiatric: She has a normal mood and affect. Her behavior is normal.    ED Course  Procedures (including critical care time) Labs Review Labs Reviewed  CBG MONITORING, ED - Abnormal; Notable for the following:    Glucose-Capillary 357 (*)    All other components within normal limits  URINE CULTURE   CULTURE, BLOOD (ROUTINE X 2)  CULTURE, BLOOD (ROUTINE X 2)  CBC WITH DIFFERENTIAL  COMPREHENSIVE METABOLIC PANEL  URINALYSIS, ROUTINE W REFLEX MICROSCOPIC  I-STAT CG4 LACTIC ACID, ED    Imaging Review Ct Abdomen Pelvis W Contrast  02/13/2014   CLINICAL DATA:  Right groin pain.  Previous right groin abscess.  EXAM: CT ABDOMEN AND PELVIS WITH CONTRAST  TECHNIQUE: Multidetector CT imaging of the abdomen and pelvis was performed using the standard protocol following bolus administration of intravenous contrast.  CONTRAST:  OMNIPAQUE IOHEXOL 300 MG/ML  SOLN  COMPARISON:  None.  FINDINGS: A moderate size hiatal hernia is seen. The liver, gallbladder, spleen, pancreas, adrenal glands, and kidneys are normal in appearance. No evidence hydronephrosis. Small uterine fibroids noted. Adnexal regions are unremarkable in appearance. No other soft tissue masses or  lymphadenopathy identified within the abdomen or pelvis.  A small paraumbilical hernia is seen containing only fat. No evidence of herniated bowel loops or bowel obstruction.  In the subcutaneous tissues of the right groin and labia, there is inflammatory change, consistent with cellulitis. No abscess is identified. No other intra-abdominal or pelvic inflammatory process identified.  IMPRESSION: Cellulitis involving the subcutaneous tissues of the right groin and labia. No evidence of abscess.  Incidental findings including moderate hiatal hernia, small paraumbilical hernia, and small uterine fibroids.   Electronically Signed   By: Myles RosenthalJohn  Stahl M.D.   On: 02/13/2014 22:06     EKG Interpretation None      MDM   Final diagnoses:  None    Patient has no evidence of sepsis. She was given IV vancomycin and IV Zosyn in the emergency department. Her vital signs are stable. I discussed with Dr. Eloise HarmanPaterson. Will admit to a MedSurg bed.    Loren Raceravid Payne Garske, MD 02/15/14 [phone]

## 2014-02-15 NOTE — Care Management Note (Signed)
  Page 1 of 1   02/17/2014     8:29:11 AM CARE MANAGEMENT NOTE 02/17/2014  Patient:  [name],[name]   Account Number:  [id number]  Date Initiated:  02/15/2014  Documentation initiated by:  Ronny FlurryWILE,Masako Overall  Subjective/Objective Assessment:     Action/Plan:   Anticipated DC Date:  02/17/2014   Anticipated DC Plan:  HOME W HOME HEALTH SERVICES         Choice offered to / List presented to:  C-1 Patient        HH arranged  HH-1 RN  HH-2 PT  HH-3 OT  HH-4 NURSE'S AIDE      HH agency  [facility]   Status of service:  Completed, signed off Medicare Important Message given?  YES (If response is "NO", the following Medicare IM given date fields will be blank) Date Medicare IM given:  02/15/2014 Medicare IM given by:  Ronny FlurryWILE,Catherine Hafer Date Additional Medicare IM given:   Additional Medicare IM given by:    Discharge Disposition:  HOME W HOME HEALTH SERVICES  Per UR Regulation:    If discussed at Long Length of Stay Meetings, dates discussed:    Comments:  02-15-14 Patient legally blind , explained IM to patient , patient voiced understanding , left IM in room , patient states son will be in this evening . Confirmed facesheet information with patient . Patient lives with son and daughter in law . Ronny FlurryHeather Tyreque Finken RN SBN [phone]

## 2014-02-15 NOTE — ED Notes (Signed)
Dr. Ranae PalmsYelverton notified of CG-4 result.

## 2014-02-16 ENCOUNTER — Encounter (INDEPENDENT_AMBULATORY_CARE_PROVIDER_SITE_OTHER): Payer: Medicare HMO | Admitting: General Surgery

## 2014-02-16 LAB — CBC
HEMATOCRIT: 30.1 % — AB (ref 36.0–46.0)
HEMOGLOBIN: 9.4 g/dL — AB (ref 12.0–15.0)
MCH: 26.5 pg (ref 26.0–34.0)
MCHC: 31.2 g/dL (ref 30.0–36.0)
MCV: 84.8 fL (ref 78.0–100.0)
Platelets: 201 10*3/uL (ref 150–400)
RBC: 3.55 MIL/uL — AB (ref 3.87–5.11)
RDW: 16.6 % — ABNORMAL HIGH (ref 11.5–15.5)
WBC: 5.5 10*3/uL (ref 4.0–10.5)

## 2014-02-16 LAB — COMPREHENSIVE METABOLIC PANEL
ALBUMIN: 2.6 g/dL — AB (ref 3.5–5.2)
ALT: 6 U/L (ref 0–35)
ANION GAP: 12 (ref 5–15)
AST: 18 U/L (ref 0–37)
Alkaline Phosphatase: 61 U/L (ref 39–117)
BUN: 23 mg/dL (ref 6–23)
CO2: 24 mEq/L (ref 19–32)
CREATININE: 1.31 mg/dL — AB (ref 0.50–1.10)
Calcium: 9.7 mg/dL (ref 8.4–10.5)
Chloride: 103 mEq/L (ref 96–112)
GFR calc Af Amer: 47 mL/min — ABNORMAL LOW (ref >90)
GFR calc non Af Amer: 40 mL/min — ABNORMAL LOW (ref >90)
Glucose, Bld: 162 mg/dL — ABNORMAL HIGH (ref 70–99)
POTASSIUM: 4.8 meq/L (ref 3.7–5.3)
Sodium: 139 mEq/L (ref 137–147)
TOTAL PROTEIN: 6.3 g/dL (ref 6.0–8.3)
Total Bilirubin: <0.2 mg/dL — ABNORMAL LOW (ref 0.3–1.2)

## 2014-02-16 LAB — CULTURE, BLOOD (ROUTINE X 2)

## 2014-02-16 LAB — URINE CULTURE
COLONY COUNT: NO GROWTH
Culture: NO GROWTH

## 2014-02-16 LAB — GLUCOSE, CAPILLARY
GLUCOSE-CAPILLARY: 169 mg/dL — AB (ref 70–99)
Glucose-Capillary: 111 mg/dL — ABNORMAL HIGH (ref 70–99)
Glucose-Capillary: 264 mg/dL — ABNORMAL HIGH (ref 70–99)
Glucose-Capillary: 153 mg/dL — ABNORMAL HIGH (ref 70–99)

## 2014-02-16 LAB — WOUND CULTURE

## 2014-02-16 NOTE — Evaluation (Signed)
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 Presented to the ER 7/5/15with groin pain and was found to have a cellulitis of her inner thigh and labia. CT did not show abscess and she did not have an elevated WBC count .  She was later contacted by the ED for admission due to positive gram positive cultures.   Clinical Impression   Patient evaluated by Occupational Therapy with no further acute OT needs identified. All education has been completed and the patient has no further questions. Pt appears to be at her baseline and is able to perform BADLs modified independently.  SHe requires supervision to min guard assist for functional mobility due to visual deficits and pt is in unfamiliar environment.   See below for any follow-up Occupational Therapy or equipment needs. OT is signing off. Thank you for this referral.      Follow Up Recommendations  No OT follow up;Supervision - Intermittent    Equipment Recommendations  None recommended by OT    Recommendations for Other Services       Precautions / Restrictions Precautions Precautions: Fall Precaution Comments: Pt blind      Mobility Bed Mobility                  Transfers Overall transfer level: Needs assistance   Transfers: Sit to/from Stand;Stand Pivot Transfers Sit to Stand: Modified independent (Device/Increase time) Stand pivot transfers: Supervision       General transfer comment: Pt requires verbal and tactile cues for transfers due to visual deficits and pt in unfamiliar environment     Balance Overall balance assessment: No apparent balance deficits (not formally assessed)                                          ADL Overall ADL's : At  baseline                                       General ADL Comments: Pt appears to be at baseline and she agrees with this assessment      Vision                     Perception     Praxis      Pertinent Vitals/Pain Denies pain      Hand Dominance Right   Extremity/Trunk Assessment Upper Extremity Assessment Upper Extremity Assessment: RUE deficits/detail RUE Deficits / Details: Minimal shoulder AROM due to old injury.  She is Rt hand dominant, but uses Lt. hand more since injury in 2007 or 2008   Lower Extremity Assessment Lower Extremity Assessment: Defer to PT evaluation       Communication     Cognition Arousal/Alertness: Awake/alert Behavior During [facility]: [facility] for tasks assessed/performed Overall Cognitive Status: Within Functional Limits for tasks assessed                     General Comments       Exercises       Shoulder Instructions      Home Living  Family/patient expects to be discharged to:: Private residence Living Arrangements: Children Available Help at Discharge: Family;Available PRN/intermittently Type of Home: House Home Access: Stairs to enter Entergy CorporationEntrance Stairs-Number of Steps: 1   Home Layout: One level     Bathroom Shower/Tub: Producer, television/film/videoWalk-in shower   Bathroom Toilet: Handicapped height Bathroom Accessibility: Yes   Home Equipment: Shower seat;Grab bars - tub/shower          Prior Functioning/Environment Level of Independence: Needs assistance  Gait / Transfers Assistance Needed: Ambulates without a device modified independently  ADL's / Homemaking Assistance Needed: Pt reports that she performs BADLs modified independently.  Son performs IADLs.  Pt indicates she struggles with donning socks, and was previously instructed in use of sock aid, but was unable to use due to visual deficits.   She also reports that she takes several rest breaks during BADLs and at times, will choose not to perofrm some  activities due to not feeling well         OT Diagnosis:     OT Problem List:     OT Treatment/Interventions:      OT Goals(Current goals can be found in the care plan section)    OT Frequency:     Barriers to D/C:            Co-evaluation              End of Session    Activity Tolerance: Patient tolerated treatment well Patient left: in chair;with call bell/phone within reach   Time: 1050-1102 OT Time Calculation (min): 12 min Charges:  OT General Charges $OT Visit: 1 Procedure OT Treatments $Self Care/Home Management : 8-22 mins G-Codes:    Nyle Limb M 02/16/2014, 1:10 PM

## 2014-02-16 NOTE — Progress Notes (Signed)
Subjective: Eating well this AM.  The pain in her groin is a lot less.  We discussed her cultures, which at this point are growing just 1 of 2, with her new admission ones being at the 24 hour point without growth.  No family present this AM  Objective: Vital signs in last 24 hours: Temp:  [98.2 F (36.8 C)-98.5 F (36.9 C)] 98.2 F (36.8 C) (07/08 0631) Pulse Rate:  [84-85] 84 (07/08 0631) Resp:  [16-20] 18 (07/08 0631) BP: (119-134)/(52-60) 124/54 mmHg (07/08 0631) SpO2:  [96 %-100 %] 97 % (07/08 0631) Weight change:  Last BM Date: 02/13/14  Intake/Output from previous day: 07/07 0701 - 07/08 0700 In: 2160 [P.O.:1560; IV Piggyback:600] Out: -  Intake/Output this shift: General appearance: alert, cooperative and appears stated age, morbidly obese  Head: Normocephalic, without obvious abnormality, atraumatic  Eyes: conjunctivae/corneas clear. Legally Blind.  Resp: clear to auscultation bilaterally  Cardio: regular rate and rhythm, S1, S2 normal, no murmur, click, rub or gallop  GI: soft, non-tender; bowel sounds normal; no masses, no organomegaly  Extremities: extremities normal, atraumatic, no cyanosis or edema  Pulses: 2+ and symmetric  Skin: Indurated rash R intertriginoous groin without drainage, dressed per wound care recs yesterday.  Neurologic: Alert and oriented X 3, normal strength and tone. Normal symmetric reflexes.    Lab Results:  Recent Labs  02/15/14 0024 02/16/14 0100  WBC 5.8 5.5  HGB 10.0* 9.4*  HCT 31.6* 30.1*  PLT 243 201   BMET  Recent Labs  02/15/14 0024 02/16/14 0100  NA 140 139  K 4.6 4.8  CL 102 103  CO2 25 24  GLUCOSE 322* 162*  BUN 24* 23  CREATININE 1.18* 1.31*  CALCIUM 10.5 9.7    Studies/Results: No results found.  Medications:  I have reviewed the patient's current medications. Scheduled: . amLODipine  10 mg Oral Daily  . aspirin EC  81 mg Oral Daily  . atorvastatin  20 mg Oral q1800  . cilostazol  100 mg Oral q  morning - 10a  . docusate sodium  100 mg Oral BID  . enoxaparin (LOVENOX) injection  40 mg Subcutaneous Q24H  . ferrous sulfate  325 mg Oral Daily  . gabapentin  300 mg Oral QHS  . glimepiride  2 mg Oral BID AC  . insulin aspart  0-20 Units Subcutaneous TID WC  . insulin aspart  4 Units Subcutaneous TID WC  . insulin glargine  40 Units Subcutaneous QHS  . irbesartan  300 mg Oral Daily  . labetalol  100 mg Oral BID  . linagliptin  5 mg Oral Daily  . metFORMIN  1,000 mg Oral BID WC  . multivitamin with minerals  1 tablet Oral Daily  . pantoprazole  40 mg Oral Daily  . piperacillin-tazobactam (ZOSYN)  IV  3.375 g Intravenous Q8H  . sodium chloride  3 mL Intravenous Q12H  . torsemide  10 mg Oral q morning - 10a  . vancomycin  1,500 mg Intravenous Q12H   Continuous:  AVW:UJWJXBPRN:sodium chloride, acetaminophen, acetaminophen, HYDROcodone-acetaminophen, ondansetron (ZOFRAN) IV, ondansetron, polyethylene glycol, sodium chloride, zolpidem  Assessment/Plan: Active Problems:  Cellulitis of right groin  Sepsis  1 of 2 blood cultures with gram positive cocci in clusters. ? comtaminant vs actual sepsis in a patient with a normal WBC count.Awaiting identification, which will likely come back later today. She was called per the ER for admission based on this culture and given Vancomycin/Zosyn empirically. A 2nd set of cultures was  drawn prior to administration of these.They remain negative at 24 hours. Wound care had nothing to add except general care.  No evidence of abscess on CT from earlier this week, so surgical consult not necessary. DM2 with triopathy: Her last A1C was actually one of her better. She realistically has a goal in the mid 7's as pushing her any lower causes hypoglycemia and she has poor awareness and given her visual deficits, difficulty in taking actions by herself Appreciate Diabetes Teaching seeing her, CBG's quite well controlled for her yesterday. OA: Tylenol as pain control,  hydrocodone if needed. Had nausea with oxycodone in the past. Has had ongoing rotator cuff issues on R as well   Minimal pain at present. Nursing will note legal blindness at bedside.  HTN: Controlled reasonably well on multiple drug therapy, will continue  Hyperlipidemia: Continue statin  PVD: Pletal, ASA  GERD On Dexilant as outpatient, will give hospital formulary equivalent.  Nutrition consult: Low albumin state despite morbid obesity. Prealbumin 20, so may have to do with FLD.  Eating well this AM   Plan 1 more day Vanc/Zosyn while cultures are sorted out.  If only 1 of 2 bottles and thought to be contaminant, can go home tomorrow with doxycycline as she was initially given as an outpatient.  Home health, etc forms were signed for extra in home help as she is homebound.  Social work will formally arrange this for post discharge.  LOS: 1 day   Masen Luallen W 02/16/2014, 8:18 AM

## 2014-02-16 NOTE — Evaluation (Signed)
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 Presented to the ER 7/5/15with groin pain and was found to have a cellulitis of her inner thigh and labia. CT did not show abscess and she did not have an elevated WBC count .  She was later contacted by the ED for admission due to positive gram positive cultures.  Clinical Impression  Patient evaluated by physical therapy with no further acute PT needs identified. Pt appears to be at her baseline and is able to transfer and amb with supervision due to visual deficits and pt is in unfamiliar environment. PT signing off, please re-consult if need in future.     Follow Up Recommendations No PT follow up;Supervision/Assistance - 24 hour    Equipment Recommendations  None recommended by PT    Recommendations for Other Services       Precautions / Restrictions Precautions Precautions: Fall Precaution Comments: Pt blind Restrictions Weight Bearing Restrictions: No      Mobility  Bed Mobility Overal bed mobility:  (pt up in chair upon PT arrival)                Transfers Overall transfer level: Needs assistance Equipment used: None Transfers: Sit to/from BJ'sStand;Stand Pivot Transfers Sit to Stand: Modified independent (Device/Increase time) Stand pivot transfers: Supervision       General transfer comment: Pt requires verbal and tactile cues for transfers due to visual deficits and pt in unfamiliar environment   Ambulation/Gait Ambulation/Gait assistance: Min assist Ambulation Distance (Feet): 75 Feet (x2, 3 min seated rest break) Assistive device: 1 person hand held assist Gait Pattern/deviations: Step-through pattern;Wide base of support     General Gait Details: pt with no episodes  of LOB, pt requires HHA for tactile directional cues due to unfamilar territory  Stairs            Wheelchair Mobility    Modified Rankin (Stroke Patients Only)       Balance Overall balance assessment: No apparent balance deficits (not formally assessed)                                           Pertinent Vitals/Pain Denies pain    Home Living Family/patient expects to be discharged to:: Private residence Living Arrangements: Children Available Help at Discharge: Family;Available PRN/intermittently Type of Home: House Home Access: Stairs to enter   Enter[employer] CorporationEntrance Stairs-Number of Steps: 1 Home Layout: One level Home Equipment: Shower seat;Grab bars - tub/shower      Prior Function Level of Independence: Needs assistance   Gait / Transfers Assistance Needed: pt furniture walks in the home due to familarity, will use someones arm going to/from church and hair salon and uses w/c for MD appts  ADL's / Homemaking Assistance Needed: pt reports she can perform dressing/bathing indep and she sits down on shower seat to "soap up" and to dry off        Hand Dominance   Dominant Hand: Right    Extremity/Trunk Assessment   Upper Extremity Assessment: Defer to OT evaluation RUE Deficits / Details: Minimal shoulder AROM due to old injury.  She is Rt hand dominant, but uses Lt. hand  more since injury in 2007 or 2008         Lower Extremity Assessment: Overall WFL for tasks assessed      Cervical / Trunk Assessment: Normal  Communication   Communication: No difficulties  Cognition Arousal/Alertness: Awake/alert Behavior During Therapy: WFL for tasks assessed/performed Overall Cognitive Status: Within Functional Limits for tasks assessed                      General Comments      Exercises        Assessment/Plan    PT Assessment Patent does not need any further PT services  PT Diagnosis     PT Problem List    PT Treatment  Interventions     PT Goals (Current goals can be found in the Care Plan section) Acute Rehab PT Goals Patient Stated Goal: home PT Goal Formulation: No goals set, d/c therapy    Frequency     Barriers to discharge        Co-evaluation               End of Session Equipment Utilized During Treatment: Gait belt Activity Tolerance: Patient tolerated treatment well Patient left: in chair;with call bell/phone within reach Nurse Communication: Mobility status         Time: 1210-1231 PT Time Calculation (min): 21 min   Charges:   PT Evaluation $Initial PT Evaluation Tier I: 1 Procedure PT Treatments $Gait Training: 8-22 mins   PT G CodesMarcene Brawn:          Elena Davia Clydia 02/16/2014, 2:03 PM  Lewis ShockAshly Marabelle Cushman, PT, DPT Pager #: [phone] Office #: [phone]

## 2014-02-17 LAB — GLUCOSE, CAPILLARY
GLUCOSE-CAPILLARY: 66 mg/dL — AB (ref 70–99)
Glucose-Capillary: 190 mg/dL — ABNORMAL HIGH (ref 70–99)

## 2014-02-17 LAB — VANCOMYCIN, TROUGH: Vancomycin Tr: 44.3 ug/mL (ref 10.0–20.0)

## 2014-02-17 MED ORDER — DOXYCYCLINE HYCLATE 100 MG PO CAPS: 100.0000 mg | ORAL_CAPSULE | Freq: Two times a day (BID) | ORAL | Status: DC

## 2014-02-17 MED ORDER — ATORVASTATIN CALCIUM 20 MG PO TABS: 20.0000 mg | ORAL_TABLET | Freq: Every day | ORAL | Status: DC

## 2014-02-17 NOTE — Progress Notes (Signed)
Spoke with RN Tresa EndoKelly regarding Vanco level 44.3. She will not hang dose and informed me that patient was being discharged home on Doxy. Will d/c current Vancomycin dosing.  Leen Tworek S. Merilynn Finlandobertson, PharmD, BCPS Clinical Staff Pharmacist Pager [phone]

## 2014-02-17 NOTE — Progress Notes (Signed)
Pt and her sister given discharge instructions.  They verbalized understanding of all instructions and follow-up.  Med list given to family showing which meds pt has had and which she still needs tonight.  Pt taken down via wheelchair to go home with her son.

## 2014-02-17 NOTE — Discharge Summary (Signed)
DISCHARGE SUMMARY  Catherine CaveMarie V Walls  MR#: [id number]  DOB:[birth year]  Date of Admission: 02/15/2014 Date of Discharge: 02/17/2014  Attending Physician:Keilen Kahl W  Patient's GMW:NUUVOZD,GUYQIHKPCP:Aithana Kushner W, MD  Consults:  Wound care, PT, OT Nutrition, diabetes teaching  Discharge Diagnoses: Active Problems:   Cellulitis of right groin   Sepsis, suspected, recalled per ER, coag neg staph 1 of 2 bottles DM2 with triopathy Morbid obesity Legally blind HTN Hyperlipidemia PVD   Discharge Medications:   Medication List         amLODipine 10 MG tablet  Commonly known as:  NORVASC  Take 10 mg by mouth daily.     aspirin EC 81 MG tablet  Take 81 mg by mouth daily.     atorvastatin 20 MG tablet  Commonly known as:  LIPITOR  Take 1 tablet (20 mg total) by mouth daily at 6 PM.     CALCIUM PO  Take 750 mg by mouth 2 (two) times daily.     Cholecalciferol 2000 UNITS Caps  Take 1 capsule by mouth every morning.     cilostazol 100 MG tablet  Commonly known as:  PLETAL  Take 100 mg by mouth every morning.     docusate sodium 100 MG capsule  Commonly known as:  COLACE  Take 100 mg by mouth 2 (two) times daily. Stool softener     doxycycline 100 MG capsule  Commonly known as:  VIBRAMYCIN  Take 1 capsule (100 mg total) by mouth 2 (two) times daily.     esomeprazole 20 MG capsule  Commonly known as:  NEXIUM  Take 20 mg by mouth every morning.     gabapentin 300 MG capsule  Commonly known as:  NEURONTIN  Take 300 mg by mouth at bedtime.     glimepiride 4 MG tablet  Commonly known as:  AMARYL  Take 2 mg by mouth 2 (two) times daily before a meal.     insulin glargine 100 UNIT/ML injection  Commonly known as:  LANTUS  Inject 40 Units into the skin at bedtime.     IRON PO  Take 1 tablet by mouth every morning.     labetalol 100 MG tablet  Commonly known as:  NORMODYNE  Take 100 mg by mouth 2 (two) times daily.     metFORMIN 1000 MG tablet  Commonly known as:   GLUCOPHAGE  Take 2,000 mg by mouth daily with breakfast.     ONGLYZA 5 MG Tabs tablet  Generic drug:  saxagliptin HCl  Take 5 mg by mouth every morning.     OVER THE COUNTER MEDICATION  Place 1 drop into both eyes 2 (two) times daily. Alcon Allergy eyedrops     torsemide 20 MG tablet  Commonly known as:  DEMADEX  Take 10 mg by mouth every morning.     valsartan 320 MG tablet  Commonly known as:  DIOVAN  Take 320 mg by mouth every morning.        Hospital Procedures: Ct Abdomen Pelvis W Contrast  02/13/2014   CLINICAL DATA:  Right groin pain.  Previous right groin abscess.  EXAM: CT ABDOMEN AND PELVIS WITH CONTRAST  TECHNIQUE: Multidetector CT imaging of the abdomen and pelvis was performed using the standard protocol following bolus administration of intravenous contrast.  CONTRAST:  100mL OMNIPAQUE IOHEXOL 300 MG/ML  SOLN  COMPARISON:  None.  FINDINGS: A moderate size hiatal hernia is seen. The liver, gallbladder, spleen, pancreas, adrenal glands, and kidneys are normal in appearance. No  evidence hydronephrosis. Small uterine fibroids noted. Adnexal regions are unremarkable in appearance. No other soft tissue masses or lymphadenopathy identified within the abdomen or pelvis.  A small paraumbilical hernia is seen containing only fat. No evidence of herniated bowel loops or bowel obstruction.  In the subcutaneous tissues of the right groin and labia, there is inflammatory change, consistent with cellulitis. No abscess is identified. No other intra-abdominal or pelvic inflammatory process identified.  IMPRESSION: Cellulitis involving the subcutaneous tissues of the right groin and labia. No evidence of abscess.  Incidental findings including moderate hiatal hernia, small paraumbilical hernia, and small uterine fibroids.   Electronically Signed   By: Myles Rosenthal M.D.   On: 02/13/2014 22:06    History of Present Illness: [name] is a 68 year old female seen in the ER SUn PM for groin  cellulitis, seen in our office Monday for recheck, called back to the ER per ER MD with a positive blood culture for Internal Medicine admission and management  Hospital Course: [name] was empirically placed on Vancomycin and Zosyn for a cellulitic lesion of her R groin.  CT had been done on initial eval not showing abscess.  A 2nd set of cultures was drawn prior to IV antibiotic use and she had had only 1 dose of doxycycline as an outpatient as her family had not fuilled this until she was seen in my office Monday afternoon for some reason.  Her cultures came back Coag neg Staph in only 1 of 2 bottles with subsequent cultures negative, indicating that this was likely due to sample contamination.  She remained afebrile with a normal WBC count as well.  Wound care saw the patient and instructed on basic skin care.  DM education also worked with the family as well.  Her control is reasonable but her offered choices and snacking often have made her sugars high in the past.  She did quite well with her sugars on home regimen with controlled hospital portions so no changes were made in her basals.  She was listed as having been off her statin for some reason so this was corrected and a new RX sent in as well.    Care management has seen the patient and as she is homebound, PT/OT and home health nursing has been arranged through them for the time of discharge.  [name] notes at the time of discharge that she feels well and is not having any pain or drainage in her groin.  Day of Discharge Exam BP 149/49  Pulse 92  Temp(Src) 98.7 F (37.1 C) (Oral)  Resp 20  Ht 5\' 6"  (1.676 m)  Wt 140.116 kg (308 lb 14.4 oz)  BMI 49.88 kg/m2  SpO2 100%  Physical Exam: General appearance: alert, cooperative and appears stated age, morbidly obese  Head: Normocephalic, without obvious abnormality, atraumatic  Eyes: conjunctivae/corneas clear L, chronic cloudiness R. Legally Blind.  Resp: clear to auscultation bilaterally   Cardio: regular rate and rhythm, S1, S2 normal, no murmur, click, rub or gallop  GI: soft, non-tender; bowel sounds normal; no masses, no organomegaly  Extremities: extremities normal, atraumatic, no cyanosis or edema  Pulses: 2+ and symmetric  Skin: Indurated rash R intertriginoous groin without drainage, dressed per wound care recs.  Neurologic: Alert and oriented X 3, normal strength and tone. Normal symmetric reflexes.    Discharge Labs:  Recent Labs  02/15/14 0024 02/16/14 0100  NA 140 139  K 4.6 4.8  CL 102 103  CO2 25  24  GLUCOSE 322* 162*  BUN 24* 23  CREATININE 1.18* 1.31*  CALCIUM 10.5 9.7    Recent Labs  02/15/14 0024 02/16/14 0100  AST 11 18  ALT 8 6  ALKPHOS 80 61  BILITOT <0.2* <0.2*  PROT 6.8 6.3  ALBUMIN 3.0* 2.6*    Recent Labs  02/15/14 0024 02/16/14 0100  WBC 5.8 5.5  NEUTROABS 3.2  --   HGB 10.0* 9.4*  HCT 31.6* 30.1*  MCV 84.3 84.8  PLT 243 201    Discharge instructions:  Family to change dressings and clean/bathe area as directed by wound care  Disposition: Home with family, home health as noted above Follow-up Appts: Follow-up with Dr. Wylene Simmer at [facility] in 1 week.  [name] will call for appointment later today or tomorrow to arrange with family's schedule.  Condition on Discharge: Stable    Signed: Leemon Ayala W 02/17/2014, 8:02 AM

## 2014-02-20 LAB — CULTURE, BLOOD (ROUTINE X 2): Culture: NO GROWTH

## 2014-02-21 LAB — CULTURE, BLOOD (ROUTINE X 2)
CULTURE: NO GROWTH
Culture: NO GROWTH

## 2014-10-12 DIAGNOSIS — M940 Chondrocostal junction syndrome [Tietze]: Secondary | ICD-10-CM | POA: Diagnosis not present

## 2014-10-12 DIAGNOSIS — E118 Type 2 diabetes mellitus with unspecified complications: Secondary | ICD-10-CM | POA: Diagnosis not present

## 2014-10-12 DIAGNOSIS — I10 Essential (primary) hypertension: Secondary | ICD-10-CM | POA: Diagnosis not present

## 2014-10-12 DIAGNOSIS — K219 Gastro-esophageal reflux disease without esophagitis: Secondary | ICD-10-CM | POA: Diagnosis not present

## 2014-10-12 DIAGNOSIS — Z6841 Body Mass Index (BMI) 40.0 and over, adult: Secondary | ICD-10-CM | POA: Diagnosis not present

## 2014-10-12 DIAGNOSIS — R079 Chest pain, unspecified: Secondary | ICD-10-CM | POA: Diagnosis not present

## 2014-11-22 ENCOUNTER — Ambulatory Visit: Payer: Commercial Managed Care - HMO

## 2014-11-23 ENCOUNTER — Encounter: Payer: Self-pay | Admitting: Podiatry

## 2014-11-23 ENCOUNTER — Ambulatory Visit (INDEPENDENT_AMBULATORY_CARE_PROVIDER_SITE_OTHER): Payer: Commercial Managed Care - HMO | Admitting: Podiatry

## 2014-11-23 DIAGNOSIS — M79676 Pain in unspecified toe(s): Secondary | ICD-10-CM | POA: Diagnosis not present

## 2014-11-23 DIAGNOSIS — B351 Tinea unguium: Secondary | ICD-10-CM | POA: Diagnosis not present

## 2014-11-23 NOTE — Patient Instructions (Signed)
Diabetes and Foot Care Diabetes may cause you to have problems because of poor blood supply (circulation) to your feet and legs. This may cause the skin on your feet to become thinner, break easier, and heal more slowly. Your skin may become dry, and the skin may peel and crack. You may also have nerve damage in your legs and feet causing decreased feeling in them. You may not notice minor injuries to your feet that could lead to infections or more serious problems. Taking care of your feet is one of the most important things you can do for yourself.  HOME CARE INSTRUCTIONS  Wear shoes at all times, even in the house. Do not go barefoot. Bare feet are easily injured.  Check your feet daily for blisters, cuts, and redness. If you cannot see the bottom of your feet, use a mirror or ask someone for help.  Wash your feet with warm water (do not use hot water) and mild soap. Then pat your feet and the areas between your toes until they are completely dry. Do not soak your feet as this can dry your skin.  Apply a moisturizing lotion or petroleum jelly (that does not contain alcohol and is unscented) to the skin on your feet and to dry, brittle toenails. Do not apply lotion between your toes.  Trim your toenails straight across. Do not dig under them or around the cuticle. File the edges of your nails with an emery board or nail file.  Do not cut corns or calluses or try to remove them with medicine.  Wear clean socks or stockings every day. Make sure they are not too tight. Do not wear knee-high stockings since they may decrease blood flow to your legs.  Wear shoes that fit properly and have enough cushioning. To break in new shoes, wear them for just a few hours a day. This prevents you from injuring your feet. Always look in your shoes before you put them on to be sure there are no objects inside.  Do not cross your legs. This may decrease the blood flow to your feet.  If you find a minor scrape,  cut, or break in the skin on your feet, keep it and the skin around it clean and dry. These areas may be cleansed with mild soap and water. Do not cleanse the area with peroxide, alcohol, or iodine.  When you remove an adhesive bandage, be sure not to damage the skin around it.  If you have a wound, look at it several times a day to make sure it is healing.  Do not use heating pads or hot water bottles. They may burn your skin. If you have lost feeling in your feet or legs, you may not know it is happening until it is too late.  Make sure your health care provider performs a complete foot exam at least annually or more often if you have foot problems. Report any cuts, sores, or bruises to your health care provider immediately. SEEK MEDICAL CARE IF:   You have an injury that is not healing.  You have cuts or breaks in the skin.  You have an ingrown nail.  You notice redness on your legs or feet.  You feel burning or tingling in your legs or feet.  You have pain or cramps in your legs and feet.  Your legs or feet are numb.  Your feet always feel cold. SEEK IMMEDIATE MEDICAL CARE IF:   There is increasing redness,   swelling, or pain in or around a wound.  There is a red line that goes up your leg.  Pus is coming from a wound.  You develop a fever or as directed by your health care provider.  You notice a bad smell coming from an ulcer or wound. Document Released: 07/26/2000 Document Revised: 03/31/2013 Document Reviewed: 01/05/2013 [employer] Patient Information 2015 [employer]. This information is not intended to replace advice given to you by your health care provider. Make sure you discuss any questions you have with your health care provider.  

## 2014-11-24 NOTE — Progress Notes (Signed)
Patient ID: [name], female   DOB: 10/25/1945, 69 y.o.   MRN: [id number]  Subjective: This patient presents today requesting toenail debridement complaining of discomfort in the nails. 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

## 2014-12-19 DIAGNOSIS — E118 Type 2 diabetes mellitus with unspecified complications: Secondary | ICD-10-CM | POA: Diagnosis not present

## 2014-12-19 DIAGNOSIS — N3281 Overactive bladder: Secondary | ICD-10-CM | POA: Diagnosis not present

## 2014-12-19 DIAGNOSIS — I10 Essential (primary) hypertension: Secondary | ICD-10-CM | POA: Diagnosis not present

## 2014-12-19 DIAGNOSIS — R443 Hallucinations, unspecified: Secondary | ICD-10-CM | POA: Diagnosis not present

## 2014-12-19 DIAGNOSIS — E559 Vitamin D deficiency, unspecified: Secondary | ICD-10-CM | POA: Diagnosis not present

## 2014-12-19 DIAGNOSIS — H548 Legal blindness, as defined in USA: Secondary | ICD-10-CM | POA: Diagnosis not present

## 2014-12-19 DIAGNOSIS — D638 Anemia in other chronic diseases classified elsewhere: Secondary | ICD-10-CM | POA: Diagnosis not present

## 2014-12-19 DIAGNOSIS — I739 Peripheral vascular disease, unspecified: Secondary | ICD-10-CM | POA: Diagnosis not present

## 2015-03-06 ENCOUNTER — Ambulatory Visit: Payer: Commercial Managed Care - HMO | Admitting: Podiatry

## 2015-03-07 ENCOUNTER — Encounter: Payer: Self-pay | Admitting: Podiatry

## 2015-03-07 ENCOUNTER — Ambulatory Visit (INDEPENDENT_AMBULATORY_CARE_PROVIDER_SITE_OTHER): Payer: Commercial Managed Care - HMO | Admitting: Podiatry

## 2015-03-07 VITALS — BP 153/85 | HR 89 | Resp 18

## 2015-03-07 DIAGNOSIS — B351 Tinea unguium: Secondary | ICD-10-CM

## 2015-03-07 DIAGNOSIS — E1140 Type 2 diabetes mellitus with diabetic neuropathy, unspecified: Secondary | ICD-10-CM | POA: Diagnosis not present

## 2015-03-07 DIAGNOSIS — M79676 Pain in unspecified toe(s): Secondary | ICD-10-CM

## 2015-03-07 NOTE — Progress Notes (Signed)
   Subjective:    Patient ID: [name], female    DOB: 02/27/1946, 69 y.o.   MRN: [id number]  HPI  I AM HERE TO GET MY TOENAILS TRIMMED UP AND IT HAS BEEN 3 MONTHS SINCE I WAS HERE This patient presents today for a scheduled visit and requests debridement of mycotic toenails  Review of Systems  All other systems reviewed and are negative.      Objective:   Physical Exam  The toenails are elongated, incurvated, discolored, hypertrophic and tender direct palpation 6-10       Assessment & Plan:   Assessment: History of diabetic with arthropathy, angiopathy, and neuropathy Mycotic toenails 6-10  Plan: Debridement of toenails 10 without any bleeding  Reappoint 3 months

## 2015-03-07 NOTE — Patient Instructions (Signed)
Diabetes and Foot Care Diabetes may cause you to have problems because of poor blood supply (circulation) to your feet and legs. This may cause the skin on your feet to become thinner, break easier, and heal more slowly. Your skin may become dry, and the skin may peel and crack. You may also have nerve damage in your legs and feet causing decreased feeling in them. You may not notice minor injuries to your feet that could lead to infections or more serious problems. Taking care of your feet is one of the most important things you can do for yourself.  HOME CARE INSTRUCTIONS  Wear shoes at all times, even in the house. Do not go barefoot. Bare feet are easily injured.  Check your feet daily for blisters, cuts, and redness. If you cannot see the bottom of your feet, use a mirror or ask someone for help.  Wash your feet with warm water (do not use hot water) and mild soap. Then pat your feet and the areas between your toes until they are completely dry. Do not soak your feet as this can dry your skin.  Apply a moisturizing lotion or petroleum jelly (that does not contain alcohol and is unscented) to the skin on your feet and to dry, brittle toenails. Do not apply lotion between your toes.  Trim your toenails straight across. Do not dig under them or around the cuticle. File the edges of your nails with an emery board or nail file.  Do not cut corns or calluses or try to remove them with medicine.  Wear clean socks or stockings every day. Make sure they are not too tight. Do not wear knee-high stockings since they may decrease blood flow to your legs.  Wear shoes that fit properly and have enough cushioning. To break in new shoes, wear them for just a few hours a day. This prevents you from injuring your feet. Always look in your shoes before you put them on to be sure there are no objects inside.  Do not cross your legs. This may decrease the blood flow to your feet.  If you find a minor scrape,  cut, or break in the skin on your feet, keep it and the skin around it clean and dry. These areas may be cleansed with mild soap and water. Do not cleanse the area with peroxide, alcohol, or iodine.  When you remove an adhesive bandage, be sure not to damage the skin around it.  If you have a wound, look at it several times a day to make sure it is healing.  Do not use heating pads or hot water bottles. They may burn your skin. If you have lost feeling in your feet or legs, you may not know it is happening until it is too late.  Make sure your health care provider performs a complete foot exam at least annually or more often if you have foot problems. Report any cuts, sores, or bruises to your health care provider immediately. SEEK MEDICAL CARE IF:   You have an injury that is not healing.  You have cuts or breaks in the skin.  You have an ingrown nail.  You notice redness on your legs or feet.  You feel burning or tingling in your legs or feet.  You have pain or cramps in your legs and feet.  Your legs or feet are numb.  Your feet always feel cold. SEEK IMMEDIATE MEDICAL CARE IF:   There is increasing redness,   swelling, or pain in or around a wound.  There is a red line that goes up your leg.  Pus is coming from a wound.  You develop a fever or as directed by your health care provider.  You notice a bad smell coming from an ulcer or wound. Document Released: 07/26/2000 Document Revised: 03/31/2013 Document Reviewed: 01/05/2013 [employer] Patient Information 2015 [employer]. This information is not intended to replace advice given to you by your health care provider. Make sure you discuss any questions you have with your health care provider.  

## 2015-04-27 DIAGNOSIS — I10 Essential (primary) hypertension: Secondary | ICD-10-CM | POA: Diagnosis not present

## 2015-04-27 DIAGNOSIS — E118 Type 2 diabetes mellitus with unspecified complications: Secondary | ICD-10-CM | POA: Diagnosis not present

## 2015-04-27 DIAGNOSIS — E559 Vitamin D deficiency, unspecified: Secondary | ICD-10-CM | POA: Diagnosis not present

## 2015-04-27 DIAGNOSIS — I2510 Atherosclerotic heart disease of native coronary artery without angina pectoris: Secondary | ICD-10-CM | POA: Diagnosis not present

## 2015-05-03 DIAGNOSIS — Z0000 Encounter for general adult medical examination without abnormal findings: Secondary | ICD-10-CM | POA: Diagnosis not present

## 2015-05-03 DIAGNOSIS — M19041 Primary osteoarthritis, right hand: Secondary | ICD-10-CM | POA: Diagnosis not present

## 2015-05-03 DIAGNOSIS — E118 Type 2 diabetes mellitus with unspecified complications: Secondary | ICD-10-CM | POA: Diagnosis not present

## 2015-05-03 DIAGNOSIS — I10 Essential (primary) hypertension: Secondary | ICD-10-CM | POA: Diagnosis not present

## 2015-05-03 DIAGNOSIS — H548 Legal blindness, as defined in USA: Secondary | ICD-10-CM | POA: Diagnosis not present

## 2015-05-03 DIAGNOSIS — D638 Anemia in other chronic diseases classified elsewhere: Secondary | ICD-10-CM | POA: Diagnosis not present

## 2015-05-03 DIAGNOSIS — N3281 Overactive bladder: Secondary | ICD-10-CM | POA: Diagnosis not present

## 2015-05-03 DIAGNOSIS — M5489 Other dorsalgia: Secondary | ICD-10-CM | POA: Diagnosis not present

## 2015-06-13 ENCOUNTER — Ambulatory Visit (INDEPENDENT_AMBULATORY_CARE_PROVIDER_SITE_OTHER): Payer: Commercial Managed Care - HMO | Admitting: Podiatry

## 2015-06-13 ENCOUNTER — Encounter: Payer: Self-pay | Admitting: Podiatry

## 2015-06-13 DIAGNOSIS — B351 Tinea unguium: Secondary | ICD-10-CM

## 2015-06-13 DIAGNOSIS — M79676 Pain in unspecified toe(s): Secondary | ICD-10-CM

## 2015-06-13 NOTE — Progress Notes (Signed)
Patient ID: Catherine CaveMarie V Higbie, female   DOB: 09-26-1945, 69 y.o.   MRN: [id number]  Subjective: This patient presents today with son present treatment room. Patient is complaining of painful toenails and walking wearing shoes and request nail debridement. 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 Wylene Simmerisovec For the indication of type II diabetic Absent DP and PT pulses, peripheral arterial disease Hammertoe deformities fifth bilaterally  Reappoint 3 months for nail debridement Notify patient on receipt of certification for diabetic shoes

## 2015-06-13 NOTE — Patient Instructions (Signed)
Diabetes and Foot Care Diabetes may cause you to have problems because of poor blood supply (circulation) to your feet and legs. This may cause the skin on your feet to become thinner, break easier, and heal more slowly. Your skin may become dry, and the skin may peel and crack. You may also have nerve damage in your legs and feet causing decreased feeling in them. You may not notice minor injuries to your feet that could lead to infections or more serious problems. Taking care of your feet is one of the most important things you can do for yourself.  HOME CARE INSTRUCTIONS  Wear shoes at all times, even in the house. Do not go barefoot. Bare feet are easily injured.  Check your feet daily for blisters, cuts, and redness. If you cannot see the bottom of your feet, use a mirror or ask someone for help.  Wash your feet with warm water (do not use hot water) and mild soap. Then pat your feet and the areas between your toes until they are completely dry. Do not soak your feet as this can dry your skin.  Apply a moisturizing lotion or petroleum jelly (that does not contain alcohol and is unscented) to the skin on your feet and to dry, brittle toenails. Do not apply lotion between your toes.  Trim your toenails straight across. Do not dig under them or around the cuticle. File the edges of your nails with an emery board or nail file.  Do not cut corns or calluses or try to remove them with medicine.  Wear clean socks or stockings every day. Make sure they are not too tight. Do not wear knee-high stockings since they may decrease blood flow to your legs.  Wear shoes that fit properly and have enough cushioning. To break in new shoes, wear them for just a few hours a day. This prevents you from injuring your feet. Always look in your shoes before you put them on to be sure there are no objects inside.  Do not cross your legs. This may decrease the blood flow to your feet.  If you find a minor scrape,  cut, or break in the skin on your feet, keep it and the skin around it clean and dry. These areas may be cleansed with mild soap and water. Do not cleanse the area with peroxide, alcohol, or iodine.  When you remove an adhesive bandage, be sure not to damage the skin around it.  If you have a wound, look at it several times a day to make sure it is healing.  Do not use heating pads or hot water bottles. They may burn your skin. If you have lost feeling in your feet or legs, you may not know it is happening until it is too late.  Make sure your health care provider performs a complete foot exam at least annually or more often if you have foot problems. Report any cuts, sores, or bruises to your health care provider immediately. SEEK MEDICAL CARE IF:   You have an injury that is not healing.  You have cuts or breaks in the skin.  You have an ingrown nail.  You notice redness on your legs or feet.  You feel burning or tingling in your legs or feet.  You have pain or cramps in your legs and feet.  Your legs or feet are numb.  Your feet always feel cold. SEEK IMMEDIATE MEDICAL CARE IF:   There is increasing redness,   swelling, or pain in or around a wound.  There is a red line that goes up your leg.  Pus is coming from a wound.  You develop a fever or as directed by your health care provider.  You notice a bad smell coming from an ulcer or wound.   This information is not intended to replace advice given to you by your health care provider. Make sure you discuss any questions you have with your health care provider.   Document Released: 07/26/2000 Document Revised: 03/31/2013 Document Reviewed: 01/05/2013 Elsevier Interactive Patient Education 2016 [employer]  

## 2015-08-15 DIAGNOSIS — I739 Peripheral vascular disease, unspecified: Secondary | ICD-10-CM | POA: Diagnosis not present

## 2015-08-15 DIAGNOSIS — R32 Unspecified urinary incontinence: Secondary | ICD-10-CM | POA: Diagnosis not present

## 2015-08-15 DIAGNOSIS — H548 Legal blindness, as defined in USA: Secondary | ICD-10-CM | POA: Diagnosis not present

## 2015-08-15 DIAGNOSIS — E559 Vitamin D deficiency, unspecified: Secondary | ICD-10-CM | POA: Diagnosis not present

## 2015-08-15 DIAGNOSIS — D692 Other nonthrombocytopenic purpura: Secondary | ICD-10-CM | POA: Diagnosis not present

## 2015-08-15 DIAGNOSIS — I129 Hypertensive chronic kidney disease with stage 1 through stage 4 chronic kidney disease, or unspecified chronic kidney disease: Secondary | ICD-10-CM | POA: Diagnosis not present

## 2015-08-15 DIAGNOSIS — I10 Essential (primary) hypertension: Secondary | ICD-10-CM | POA: Diagnosis not present

## 2015-08-15 DIAGNOSIS — E118 Type 2 diabetes mellitus with unspecified complications: Secondary | ICD-10-CM | POA: Diagnosis not present

## 2015-08-15 DIAGNOSIS — M5489 Other dorsalgia: Secondary | ICD-10-CM | POA: Diagnosis not present

## 2015-09-26 ENCOUNTER — Ambulatory Visit (INDEPENDENT_AMBULATORY_CARE_PROVIDER_SITE_OTHER): Payer: Commercial Managed Care - HMO | Admitting: Podiatry

## 2015-09-26 ENCOUNTER — Encounter: Payer: Self-pay | Admitting: Podiatry

## 2015-09-26 DIAGNOSIS — M79676 Pain in unspecified toe(s): Secondary | ICD-10-CM | POA: Diagnosis not present

## 2015-09-26 DIAGNOSIS — B351 Tinea unguium: Secondary | ICD-10-CM | POA: Diagnosis not present

## 2015-09-26 NOTE — Progress Notes (Signed)
Patient ID: [name], female   DOB: Mar 07, 1946, 70 y.o.   MRN: [id number]  Subjective: This patient presents today with granddaughter present treatment room. Patient is complaining of painful toenails and walking wearing shoes and request nail debridement. 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 Wylene Simmer For the indication of type II diabetic Absent DP and PT pulses, peripheral arterial disease Hammertoe deformities fifth bilaterally  Submit second request for certification for diabetic shoes  Reappoint 4 months for nail debridement Notify patient on receipt of certification for diabetic shoes

## 2015-09-26 NOTE — Patient Instructions (Signed)
Diabetes and Foot Care Diabetes may cause you to have problems because of poor blood supply (circulation) to your feet and legs. This may cause the skin on your feet to become thinner, break easier, and heal more slowly. Your skin may become dry, and the skin may peel and crack. You may also have nerve damage in your legs and feet causing decreased feeling in them. You may not notice minor injuries to your feet that could lead to infections or more serious problems. Taking care of your feet is one of the most important things you can do for yourself.  HOME CARE INSTRUCTIONS  Wear shoes at all times, even in the house. Do not go barefoot. Bare feet are easily injured.  Check your feet daily for blisters, cuts, and redness. If you cannot see the bottom of your feet, use a mirror or ask someone for help.  Wash your feet with warm water (do not use hot water) and mild soap. Then pat your feet and the areas between your toes until they are completely dry. Do not soak your feet as this can dry your skin.  Apply a moisturizing lotion or petroleum jelly (that does not contain alcohol and is unscented) to the skin on your feet and to dry, brittle toenails. Do not apply lotion between your toes.  Trim your toenails straight across. Do not dig under them or around the cuticle. File the edges of your nails with an emery board or nail file.  Do not cut corns or calluses or try to remove them with medicine.  Wear clean socks or stockings every day. Make sure they are not too tight. Do not wear knee-high stockings since they may decrease blood flow to your legs.  Wear shoes that fit properly and have enough cushioning. To break in new shoes, wear them for just a few hours a day. This prevents you from injuring your feet. Always look in your shoes before you put them on to be sure there are no objects inside.  Do not cross your legs. This may decrease the blood flow to your feet.  If you find a minor scrape,  cut, or break in the skin on your feet, keep it and the skin around it clean and dry. These areas may be cleansed with mild soap and water. Do not cleanse the area with peroxide, alcohol, or iodine.  When you remove an adhesive bandage, be sure not to damage the skin around it.  If you have a wound, look at it several times a day to make sure it is healing.  Do not use heating pads or hot water bottles. They may burn your skin. If you have lost feeling in your feet or legs, you may not know it is happening until it is too late.  Make sure your health care provider performs a complete foot exam at least annually or more often if you have foot problems. Report any cuts, sores, or bruises to your health care provider immediately. SEEK MEDICAL CARE IF:   You have an injury that is not healing.  You have cuts or breaks in the skin.  You have an ingrown nail.  You notice redness on your legs or feet.  You feel burning or tingling in your legs or feet.  You have pain or cramps in your legs and feet.  Your legs or feet are numb.  Your feet always feel cold. SEEK IMMEDIATE MEDICAL CARE IF:   There is increasing redness,   swelling, or pain in or around a wound.  There is a red line that goes up your leg.  Pus is coming from a wound.  You develop a fever or as directed by your health care provider.  You notice a bad smell coming from an ulcer or wound.   This information is not intended to replace advice given to you by your health care provider. Make sure you discuss any questions you have with your health care provider.   Document Released: 07/26/2000 Document Revised: 03/31/2013 Document Reviewed: 01/05/2013 Elsevier Interactive Patient Education 2016 [employer]  

## 2015-10-10 DIAGNOSIS — Z1231 Encounter for screening mammogram for malignant neoplasm of breast: Secondary | ICD-10-CM | POA: Diagnosis not present

## 2015-10-11 DIAGNOSIS — Z1151 Encounter for screening for human papillomavirus (HPV): Secondary | ICD-10-CM | POA: Diagnosis not present

## 2015-10-11 DIAGNOSIS — Z01419 Encounter for gynecological examination (general) (routine) without abnormal findings: Secondary | ICD-10-CM | POA: Diagnosis not present

## 2015-10-11 DIAGNOSIS — R87612 Low grade squamous intraepithelial lesion on cytologic smear of cervix (LGSIL): Secondary | ICD-10-CM | POA: Diagnosis not present

## 2015-10-16 DIAGNOSIS — I129 Hypertensive chronic kidney disease with stage 1 through stage 4 chronic kidney disease, or unspecified chronic kidney disease: Secondary | ICD-10-CM | POA: Diagnosis not present

## 2015-10-16 DIAGNOSIS — I739 Peripheral vascular disease, unspecified: Secondary | ICD-10-CM | POA: Diagnosis not present

## 2015-10-16 DIAGNOSIS — E118 Type 2 diabetes mellitus with unspecified complications: Secondary | ICD-10-CM | POA: Diagnosis not present

## 2015-10-16 DIAGNOSIS — M5489 Other dorsalgia: Secondary | ICD-10-CM | POA: Diagnosis not present

## 2015-10-16 DIAGNOSIS — E559 Vitamin D deficiency, unspecified: Secondary | ICD-10-CM | POA: Diagnosis not present

## 2015-10-16 DIAGNOSIS — D638 Anemia in other chronic diseases classified elsewhere: Secondary | ICD-10-CM | POA: Diagnosis not present

## 2015-10-16 DIAGNOSIS — I10 Essential (primary) hypertension: Secondary | ICD-10-CM | POA: Diagnosis not present

## 2015-10-16 DIAGNOSIS — D692 Other nonthrombocytopenic purpura: Secondary | ICD-10-CM | POA: Diagnosis not present

## 2015-10-16 DIAGNOSIS — H548 Legal blindness, as defined in USA: Secondary | ICD-10-CM | POA: Diagnosis not present

## 2015-10-16 LAB — MICROALBUMIN, URINE: MICROALB UR: 5.3

## 2015-10-27 DIAGNOSIS — Z6841 Body Mass Index (BMI) 40.0 and over, adult: Secondary | ICD-10-CM | POA: Diagnosis not present

## 2015-10-27 DIAGNOSIS — K5900 Constipation, unspecified: Secondary | ICD-10-CM | POA: Diagnosis not present

## 2015-11-13 DIAGNOSIS — E118 Type 2 diabetes mellitus with unspecified complications: Secondary | ICD-10-CM | POA: Diagnosis not present

## 2015-11-13 DIAGNOSIS — Z6841 Body Mass Index (BMI) 40.0 and over, adult: Secondary | ICD-10-CM | POA: Diagnosis not present

## 2015-11-13 DIAGNOSIS — K5900 Constipation, unspecified: Secondary | ICD-10-CM | POA: Diagnosis not present

## 2015-11-13 DIAGNOSIS — R443 Hallucinations, unspecified: Secondary | ICD-10-CM | POA: Diagnosis not present

## 2015-11-14 ENCOUNTER — Other Ambulatory Visit: Payer: Self-pay | Admitting: Internal Medicine

## 2015-11-14 DIAGNOSIS — R443 Hallucinations, unspecified: Secondary | ICD-10-CM

## 2015-11-15 ENCOUNTER — Other Ambulatory Visit: Payer: Medicare HMO

## 2015-11-22 ENCOUNTER — Ambulatory Visit
Admission: RE | Admit: 2015-11-22 | Discharge: 2015-11-22 | Disposition: A | Discharge status: Home / Self Care | Payer: Commercial Managed Care - HMO | Source: Ambulatory Visit | Attending: Internal Medicine | Admitting: Internal Medicine

## 2015-11-22 DIAGNOSIS — R443 Hallucinations, unspecified: Secondary | ICD-10-CM

## 2015-12-05 ENCOUNTER — Ambulatory Visit: Payer: Commercial Managed Care - HMO | Admitting: *Deleted

## 2015-12-05 DIAGNOSIS — E1140 Type 2 diabetes mellitus with diabetic neuropathy, unspecified: Secondary | ICD-10-CM

## 2015-12-05 NOTE — Progress Notes (Signed)
Patient ID: [name], female   DOB: 06/19/1946, 70 y.o.   MRN: [id number] Patient presents to be scanned and measured for diabetic shoes and inserts.

## 2015-12-18 ENCOUNTER — Emergency Department (HOSPITAL_COMMUNITY)
Admission: EM | Admit: 2015-12-18 | Discharge: 2015-12-18 | Disposition: A | Discharge status: Home / Self Care | Payer: Commercial Managed Care - HMO | Attending: Emergency Medicine | Admitting: Emergency Medicine

## 2015-12-18 ENCOUNTER — Emergency Department (HOSPITAL_COMMUNITY): Payer: Commercial Managed Care - HMO

## 2015-12-18 ENCOUNTER — Encounter (HOSPITAL_COMMUNITY): Payer: Self-pay | Admitting: *Deleted

## 2015-12-18 DIAGNOSIS — Z7982 Long term (current) use of aspirin: Secondary | ICD-10-CM | POA: Insufficient documentation

## 2015-12-18 DIAGNOSIS — Z7984 Long term (current) use of oral hypoglycemic drugs: Secondary | ICD-10-CM | POA: Diagnosis not present

## 2015-12-18 DIAGNOSIS — Z79899 Other long term (current) drug therapy: Secondary | ICD-10-CM | POA: Insufficient documentation

## 2015-12-18 DIAGNOSIS — Z792 Long term (current) use of antibiotics: Secondary | ICD-10-CM | POA: Diagnosis not present

## 2015-12-18 DIAGNOSIS — Z87891 Personal history of nicotine dependence: Secondary | ICD-10-CM | POA: Insufficient documentation

## 2015-12-18 DIAGNOSIS — Z794 Long term (current) use of insulin: Secondary | ICD-10-CM | POA: Insufficient documentation

## 2015-12-18 DIAGNOSIS — H540 Blindness, both eyes: Secondary | ICD-10-CM | POA: Diagnosis not present

## 2015-12-18 DIAGNOSIS — I10 Essential (primary) hypertension: Secondary | ICD-10-CM | POA: Diagnosis not present

## 2015-12-18 DIAGNOSIS — R0789 Other chest pain: Secondary | ICD-10-CM | POA: Insufficient documentation

## 2015-12-18 DIAGNOSIS — E119 Type 2 diabetes mellitus without complications: Secondary | ICD-10-CM | POA: Diagnosis not present

## 2015-12-18 DIAGNOSIS — R079 Chest pain, unspecified: Secondary | ICD-10-CM | POA: Diagnosis not present

## 2015-12-18 LAB — BASIC METABOLIC PANEL
ANION GAP: 10 (ref 5–15)
BUN: 19 mg/dL (ref 6–20)
CALCIUM: 10.3 mg/dL (ref 8.9–10.3)
CO2: 23 mmol/L (ref 22–32)
Chloride: 110 mmol/L (ref 101–111)
Creatinine, Ser: 1.11 mg/dL — ABNORMAL HIGH (ref 0.44–1.00)
GFR, EST AFRICAN AMERICAN: 56 mL/min — AB (ref >60)
GFR, EST NON AFRICAN AMERICAN: 48 mL/min — AB (ref >60)
GLUCOSE: 128 mg/dL — AB (ref 65–99)
Potassium: 4.8 mmol/L (ref 3.5–5.1)
SODIUM: 143 mmol/L (ref 135–145)

## 2015-12-18 LAB — BRAIN NATRIURETIC PEPTIDE: B NATRIURETIC PEPTIDE 5: 27.9 pg/mL (ref 0.0–100.0)

## 2015-12-18 LAB — I-STAT TROPONIN, ED
Troponin i, poc: 0.01 ng/mL (ref 0.00–0.08)
Troponin i, poc: 0 ng/mL (ref 0.00–0.08)

## 2015-12-18 LAB — CBC
HEMATOCRIT: 35.1 % — AB (ref 36.0–46.0)
HEMOGLOBIN: 11 g/dL — AB (ref 12.0–15.0)
MCH: 28.4 pg (ref 26.0–34.0)
MCHC: 31.3 g/dL (ref 30.0–36.0)
MCV: 90.5 fL (ref 78.0–100.0)
Platelets: 217 10*3/uL (ref 150–400)
RBC: 3.88 MIL/uL (ref 3.87–5.11)
RDW: 14.5 % (ref 11.5–15.5)
WBC: 4.9 10*3/uL (ref 4.0–10.5)

## 2015-12-18 MED ORDER — CYCLOBENZAPRINE HCL 5 MG PO TABS: 5.0000 mg | ORAL_TABLET | Freq: Three times a day (TID) | ORAL | Status: DC | PRN

## 2015-12-18 MED ORDER — FAMOTIDINE 20 MG PO TABS
20.0000 mg | ORAL_TABLET | Freq: Two times a day (BID) | ORAL | Status: DC | PRN
Start: 2015-12-18 — End: 2017-10-13

## 2015-12-18 NOTE — ED Provider Notes (Signed)
CSN: [id number]     Arrival date & time 12/18/15  1311 History   First MD Initiated Contact with Patient 12/18/15 2056     Chief Complaint  Patient presents with  . Chest Pain     (Consider location/radiation/quality/duration/timing/severity/associated sxs/prior Treatment) The history is provided by the patient.  [name] is a 70 y.o. female hx of HTN, DM here with chest pain. Left sided chest pain since yesterday. Worse with movement. States that it is a burning sensation to her chest that is similar with her reflux. Mild shortness of breath with exertion as well. Denies any history of CAD or stents.    Past Medical History  Diagnosis Date  . Hypertension   . Diabetes mellitus   . Blind    Past Surgical History  Procedure Laterality Date  . Incision and drainage perirectal abscess  11/20/2011    Procedure: IRRIGATION AND DEBRIDEMENT PERIRECTAL ABSCESS;  Surgeon: Liz MaladyBurke E Thompson, MD;  [facility];  Service: General;  Laterality: Right;  Incision and Drainage of Right Groin Abscess   Family History  Problem Relation Age of Onset  . Heart disease Mother   . Diabetes Mother   . Cancer Sister     Breast  . Diabetes Sister   . Cancer Brother     Colon  . Diabetes Brother    Social History  Substance Use Topics  . Smoking status: Former Games developermoker  . Smokeless tobacco: Never Used  . Alcohol Use: No   OB History    No data available     Review of Systems  Cardiovascular: Positive for chest pain.  All other systems reviewed and are negative.     Allergies  Oxycodone  Home Medications   Prior to Admission medications   Medication Sig Start Date End Date Taking? Authorizing Provider  amLODipine (NORVASC) 10 MG tablet Take 10 mg by mouth daily.  11/03/13  Yes Historical Provider, MD  aspirin EC 81 MG tablet Take 81 mg by mouth daily.   Yes Historical Provider, MD  atorvastatin (LIPITOR) 20 MG tablet Take 1 tablet (20 mg total) by mouth daily at 6 PM. 02/17/14   Yes Gaspar Garbeichard W Tisovec, MD  CALCIUM PO Take 750 mg by mouth 2 (two) times daily.   Yes Historical Provider, MD  Cholecalciferol 2000 UNITS CAPS Take 1 capsule by mouth every evening.    Yes Historical Provider, MD  cilostazol (PLETAL) 100 MG tablet Take 100 mg by mouth every morning.    Yes Historical Provider, MD  docusate sodium (COLACE) 100 MG capsule Take 100 mg by mouth 2 (two) times daily. Stool softener   Yes Historical Provider, MD  esomeprazole (NEXIUM) 20 MG capsule Take 20 mg by mouth every morning.   Yes Historical Provider, MD  gabapentin (NEURONTIN) 300 MG capsule Take 300 mg by mouth at bedtime.   Yes Historical Provider, MD  glimepiride (AMARYL) 4 MG tablet Take 2 mg by mouth 2 (two) times daily before a meal.   Yes Historical Provider, MD  insulin glargine (LANTUS) 100 UNIT/ML injection Inject 45 Units into the skin at bedtime.    Yes Historical Provider, MD  IRON PO Take 1 tablet by mouth every evening.    Yes Historical Provider, MD  labetalol (NORMODYNE) 100 MG tablet Take 100 mg by mouth 2 (two) times daily.  12/03/11  Yes Historical Provider, MD  metFORMIN (GLUCOPHAGE) 1000 MG tablet Take 2,000 mg by mouth daily with breakfast.    Yes  Historical Provider, MD  saxagliptin HCl (ONGLYZA) 5 MG TABS tablet Take 5 mg by mouth every morning.    Yes Historical Provider, MD  torsemide (DEMADEX) 20 MG tablet Take 10 mg by mouth every morning.    Yes Historical Provider, MD  valsartan (DIOVAN) 320 MG tablet Take 320 mg by mouth every morning.   Yes Historical Provider, MD  doxycycline (VIBRAMYCIN) 100 MG capsule Take 1 capsule (100 mg total) by mouth 2 (two) times daily. 02/17/14   Gaspar Garbe, MD   BP 164/66 mmHg  Pulse 77  Temp(Src) 97.9 F (36.6 C) (Oral)  Resp 18  SpO2 98% Physical Exam  Constitutional: She is oriented to person, place, and time. She appears well-developed and well-nourished.  HENT:  Head: Normocephalic.  Mouth/Throat: Oropharynx is clear and moist.   Eyes: Conjunctivae are normal. Pupils are equal, round, and reactive to light.  Neck: Normal range of motion. Neck supple.  Cardiovascular: Normal rate, regular rhythm and normal heart sounds.   Pulmonary/Chest: Effort normal and breath sounds normal.  Reproducible L chest tenderness   Abdominal: Soft. Bowel sounds are normal. She exhibits no distension. There is no tenderness. There is no rebound.  Musculoskeletal: Normal range of motion. She exhibits no edema or tenderness.  Neurological: She is alert and oriented to person, place, and time. No cranial nerve deficit. Coordination normal.  Skin: Skin is warm and dry.  Psychiatric: She has a normal mood and affect. Her behavior is normal. Judgment and thought content normal.  Nursing note and vitals reviewed.   ED Course  Procedures (including critical care time) Labs Review Labs Reviewed  BASIC METABOLIC PANEL - Abnormal; Notable for the following:    Glucose, Bld 128 (*)    Creatinine, Ser 1.11 (*)    GFR calc non Af Amer 48 (*)    GFR calc Af Amer 56 (*)    All other components within normal limits  CBC - Abnormal; Notable for the following:    Hemoglobin 11.0 (*)    HCT 35.1 (*)    All other components within normal limits  BRAIN NATRIURETIC PEPTIDE  I-STAT TROPOININ, ED  Rosezena Sensor, ED    Imaging Review Dg Chest 2 View  12/18/2015  CLINICAL DATA:  Chest pain since yesterday.  Hypertension. EXAM: CHEST - 2 VIEW COMPARISON:  One-view chest x-ray 11/21/2011. FINDINGS: The heart is enlarged. Lung volumes are low. Mild edema is present. No focal airspace disease is evident. There are no definite effusions. IMPRESSION: 1. Cardiomegaly and mild edema suggesting congestive heart failure. 2. No focal airspace disease. Electronically Signed   By: Marin Roberts M.D.   On: 12/18/2015 14:17   I have personally reviewed and evaluated these images and lab results as part of my medical decision-making.   EKG  Interpretation   Date/Time:  Monday Dec 18 2015 13:26:10 EDT Ventricular Rate:  70 PR Interval:  182 QRS Duration: 88 QT Interval:  352 QTC Calculation: 380 R Axis:   103 Text Interpretation:  Normal sinus rhythm Rightward axis Cannot rule out  Inferior infarct , age undetermined Abnormal ECG No significant change  since last tracing Confirmed by YAO  MD, DAVID ([id number]) on 12/18/2015 8:56:36  PM      MDM   Final diagnoses:  None   BRENDALEE MATTHIES is a 70 y.o. female here with left sided chest pain. Pain since yesterday, reproducible. Likely MSK vs reflux. Pain free currently. Low suspicion for ACS so will get trop  x 2.   11:09 PM Trop neg x 2. Labs unremarkable. I doubt PE or dissection. CXR showed mild edema but BNP nl (had some shortness of breath). I think likely MSk or reflux. Will dc home with pepcid, flexeril.    Richardean Canal, MD 12/18/15 [phone]

## 2015-12-18 NOTE — ED Notes (Signed)
PT is blind. Pt is having pain to left side of chest intermittent for while but yesterday it started to become constant.  SOB with exertion which is normal for her

## 2015-12-18 NOTE — ED Notes (Signed)
Pt denies chest pain at this time.

## 2015-12-18 NOTE — ED Notes (Signed)
Pt and family verbalized understanding of follow-up care and discharge instructions. No pain at discharge.

## 2015-12-18 NOTE — Discharge Instructions (Signed)
Take tylenol for pain.   Continue nexium. Add pepcid twice daily as needed.   Take flexeril for muscle spasms.   See your doctor   Return to ER if you have worse chest pain, shortness of breath, leg swelling

## 2016-01-23 ENCOUNTER — Ambulatory Visit (INDEPENDENT_AMBULATORY_CARE_PROVIDER_SITE_OTHER): Payer: Commercial Managed Care - HMO | Admitting: Podiatry

## 2016-01-23 ENCOUNTER — Encounter: Payer: Self-pay | Admitting: Podiatry

## 2016-01-23 DIAGNOSIS — E1140 Type 2 diabetes mellitus with diabetic neuropathy, unspecified: Secondary | ICD-10-CM | POA: Diagnosis not present

## 2016-01-23 DIAGNOSIS — B351 Tinea unguium: Secondary | ICD-10-CM

## 2016-01-23 DIAGNOSIS — M79676 Pain in unspecified toe(s): Secondary | ICD-10-CM

## 2016-01-23 NOTE — Patient Instructions (Signed)
Today we discussed your diabetic shoes were on backorder and would arrive approximately in August and you decided to wait until then rather than choosing another shoe  Diabetes and Foot Care Diabetes may cause you to have problems because of poor blood supply (circulation) to your feet and legs. This may cause the skin on your feet to become thinner, break easier, and heal more slowly. Your skin may become dry, and the skin may peel and crack. You may also have nerve damage in your legs and feet causing decreased feeling in them. You may not notice minor injuries to your feet that could lead to infections or more serious problems. Taking care of your feet is one of the most important things you can do for yourself.  HOME CARE INSTRUCTIONS  Wear shoes at all times, even in the house. Do not go barefoot. Bare feet are easily injured.  Check your feet daily for blisters, cuts, and redness. If you cannot see the bottom of your feet, use a mirror or ask someone for help.  Wash your feet with warm water (do not use hot water) and mild soap. Then pat your feet and the areas between your toes until they are completely dry. Do not soak your feet as this can dry your skin.  Apply a moisturizing lotion or petroleum jelly (that does not contain alcohol and is unscented) to the skin on your feet and to dry, brittle toenails. Do not apply lotion between your toes.  Trim your toenails straight across. Do not dig under them or around the cuticle. File the edges of your nails with an emery board or nail file.  Do not cut corns or calluses or try to remove them with medicine.  Wear clean socks or stockings every day. Make sure they are not too tight. Do not wear knee-high stockings since they may decrease blood flow to your legs.  Wear shoes that fit properly and have enough cushioning. To break in new shoes, wear them for just a few hours a day. This prevents you from injuring your feet. Always look in your shoes  before you put them on to be sure there are no objects inside.  Do not cross your legs. This may decrease the blood flow to your feet.  If you find a minor scrape, cut, or break in the skin on your feet, keep it and the skin around it clean and dry. These areas may be cleansed with mild soap and water. Do not cleanse the area with peroxide, alcohol, or iodine.  When you remove an adhesive bandage, be sure not to damage the skin around it.  If you have a wound, look at it several times a day to make sure it is healing.  Do not use heating pads or hot water bottles. They may burn your skin. If you have lost feeling in your feet or legs, you may not know it is happening until it is too late.  Make sure your health care provider performs a complete foot exam at least annually or more often if you have foot problems. Report any cuts, sores, or bruises to your health care provider immediately. SEEK MEDICAL CARE IF:   You have an injury that is not healing.  You have cuts or breaks in the skin.  You have an ingrown nail.  You notice redness on your legs or feet.  You feel burning or tingling in your legs or feet.  You have pain or cramps in  your legs and feet.  Your legs or feet are numb.  Your feet always feel cold. SEEK IMMEDIATE MEDICAL CARE IF:   There is increasing redness, swelling, or pain in or around a wound.  There is a red line that goes up your leg.  Pus is coming from a wound.  You develop a fever or as directed by your health care provider.  You notice a bad smell coming from an ulcer or wound.   This information is not intended to replace advice given to you by your health care provider. Make sure you discuss any questions you have with your health care provider.   Document Released: 07/26/2000 Document Revised: 03/31/2013 Document Reviewed: 01/05/2013 Elsevier Interactive Patient Education [employer].

## 2016-01-23 NOTE — Progress Notes (Signed)
Patient ID: Catherine CaveMarie V Cogar, female   DOB: [birth year], 70 y.o.   MRN: [id number]  Subjective: This patient presents today with her son present treatment room. Patient is complaining of painful toenails and walking wearing shoes and request nail debridement. Also, patient is requesting replacement diabetic shoes The diabetic shoes were ordered, however on back order. Patient given option of ordering additional shoe or waiting for the shoe to arrive approximate August 2017. 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 Wylene Simmerisovec For the indication of type II diabetic Absent DP and PT pulses, peripheral arterial disease Hammertoe deformities fifth bilaterally  Submit second request for certification for diabetic shoes  Reappoint 4 months for nail debridement Notify patient on receipt of certification for diabetic shoes

## 2016-02-02 DIAGNOSIS — F0281 Dementia in other diseases classified elsewhere with behavioral disturbance: Secondary | ICD-10-CM | POA: Diagnosis not present

## 2016-02-02 DIAGNOSIS — F0151 Vascular dementia with behavioral disturbance: Secondary | ICD-10-CM | POA: Diagnosis not present

## 2016-02-02 DIAGNOSIS — G3183 Dementia with Lewy bodies: Secondary | ICD-10-CM | POA: Diagnosis not present

## 2016-02-12 DIAGNOSIS — G3183 Dementia with Lewy bodies: Secondary | ICD-10-CM | POA: Diagnosis not present

## 2016-02-12 DIAGNOSIS — F0281 Dementia in other diseases classified elsewhere with behavioral disturbance: Secondary | ICD-10-CM | POA: Diagnosis not present

## 2016-02-20 ENCOUNTER — Ambulatory Visit: Payer: Commercial Managed Care - HMO

## 2016-02-20 DIAGNOSIS — F0281 Dementia in other diseases classified elsewhere with behavioral disturbance: Secondary | ICD-10-CM | POA: Diagnosis not present

## 2016-02-20 DIAGNOSIS — G3183 Dementia with Lewy bodies: Secondary | ICD-10-CM | POA: Diagnosis not present

## 2016-03-06 DIAGNOSIS — I10 Essential (primary) hypertension: Secondary | ICD-10-CM | POA: Diagnosis not present

## 2016-03-06 DIAGNOSIS — E559 Vitamin D deficiency, unspecified: Secondary | ICD-10-CM | POA: Diagnosis not present

## 2016-03-06 DIAGNOSIS — I129 Hypertensive chronic kidney disease with stage 1 through stage 4 chronic kidney disease, or unspecified chronic kidney disease: Secondary | ICD-10-CM | POA: Diagnosis not present

## 2016-03-06 DIAGNOSIS — Z6841 Body Mass Index (BMI) 40.0 and over, adult: Secondary | ICD-10-CM | POA: Diagnosis not present

## 2016-03-06 DIAGNOSIS — B370 Candidal stomatitis: Secondary | ICD-10-CM | POA: Diagnosis not present

## 2016-03-06 DIAGNOSIS — E118 Type 2 diabetes mellitus with unspecified complications: Secondary | ICD-10-CM | POA: Diagnosis not present

## 2016-03-06 DIAGNOSIS — I739 Peripheral vascular disease, unspecified: Secondary | ICD-10-CM | POA: Diagnosis not present

## 2016-03-06 LAB — HEMOGLOBIN A1C: Hemoglobin A1C: 9

## 2016-04-08 DIAGNOSIS — F0281 Dementia in other diseases classified elsewhere with behavioral disturbance: Secondary | ICD-10-CM | POA: Diagnosis not present

## 2016-04-08 DIAGNOSIS — G3183 Dementia with Lewy bodies: Secondary | ICD-10-CM | POA: Diagnosis not present

## 2016-04-09 ENCOUNTER — Ambulatory Visit: Payer: Commercial Managed Care - HMO

## 2016-04-17 DIAGNOSIS — E119 Type 2 diabetes mellitus without complications: Secondary | ICD-10-CM | POA: Diagnosis not present

## 2016-04-17 DIAGNOSIS — I7389 Other specified peripheral vascular diseases: Secondary | ICD-10-CM | POA: Diagnosis not present

## 2016-04-17 DIAGNOSIS — Z7984 Long term (current) use of oral hypoglycemic drugs: Secondary | ICD-10-CM | POA: Diagnosis not present

## 2016-04-17 DIAGNOSIS — I10 Essential (primary) hypertension: Secondary | ICD-10-CM | POA: Diagnosis not present

## 2016-04-17 DIAGNOSIS — H548 Legal blindness, as defined in USA: Secondary | ICD-10-CM | POA: Diagnosis not present

## 2016-04-17 DIAGNOSIS — M5489 Other dorsalgia: Secondary | ICD-10-CM | POA: Diagnosis not present

## 2016-04-17 DIAGNOSIS — I739 Peripheral vascular disease, unspecified: Secondary | ICD-10-CM | POA: Diagnosis not present

## 2016-04-17 DIAGNOSIS — E1151 Type 2 diabetes mellitus with diabetic peripheral angiopathy without gangrene: Secondary | ICD-10-CM | POA: Diagnosis not present

## 2016-04-17 DIAGNOSIS — Z794 Long term (current) use of insulin: Secondary | ICD-10-CM | POA: Diagnosis not present

## 2016-04-17 DIAGNOSIS — Z7982 Long term (current) use of aspirin: Secondary | ICD-10-CM | POA: Diagnosis not present

## 2016-04-17 DIAGNOSIS — Z87891 Personal history of nicotine dependence: Secondary | ICD-10-CM | POA: Diagnosis not present

## 2016-04-18 DIAGNOSIS — I10 Essential (primary) hypertension: Secondary | ICD-10-CM | POA: Diagnosis not present

## 2016-04-18 DIAGNOSIS — E119 Type 2 diabetes mellitus without complications: Secondary | ICD-10-CM | POA: Diagnosis not present

## 2016-04-18 DIAGNOSIS — M5489 Other dorsalgia: Secondary | ICD-10-CM | POA: Diagnosis not present

## 2016-04-18 DIAGNOSIS — H548 Legal blindness, as defined in USA: Secondary | ICD-10-CM | POA: Diagnosis not present

## 2016-04-18 DIAGNOSIS — I739 Peripheral vascular disease, unspecified: Secondary | ICD-10-CM | POA: Diagnosis not present

## 2016-04-19 DIAGNOSIS — M5489 Other dorsalgia: Secondary | ICD-10-CM | POA: Diagnosis not present

## 2016-04-19 DIAGNOSIS — I739 Peripheral vascular disease, unspecified: Secondary | ICD-10-CM | POA: Diagnosis not present

## 2016-04-19 DIAGNOSIS — E119 Type 2 diabetes mellitus without complications: Secondary | ICD-10-CM | POA: Diagnosis not present

## 2016-04-19 DIAGNOSIS — H548 Legal blindness, as defined in USA: Secondary | ICD-10-CM | POA: Diagnosis not present

## 2016-04-19 DIAGNOSIS — I10 Essential (primary) hypertension: Secondary | ICD-10-CM | POA: Diagnosis not present

## 2016-04-22 DIAGNOSIS — M5489 Other dorsalgia: Secondary | ICD-10-CM | POA: Diagnosis not present

## 2016-04-22 DIAGNOSIS — I10 Essential (primary) hypertension: Secondary | ICD-10-CM | POA: Diagnosis not present

## 2016-04-22 DIAGNOSIS — I739 Peripheral vascular disease, unspecified: Secondary | ICD-10-CM | POA: Diagnosis not present

## 2016-04-22 DIAGNOSIS — E119 Type 2 diabetes mellitus without complications: Secondary | ICD-10-CM | POA: Diagnosis not present

## 2016-04-22 DIAGNOSIS — H548 Legal blindness, as defined in USA: Secondary | ICD-10-CM | POA: Diagnosis not present

## 2016-04-23 DIAGNOSIS — H548 Legal blindness, as defined in USA: Secondary | ICD-10-CM | POA: Diagnosis not present

## 2016-04-23 DIAGNOSIS — E119 Type 2 diabetes mellitus without complications: Secondary | ICD-10-CM | POA: Diagnosis not present

## 2016-04-23 DIAGNOSIS — I10 Essential (primary) hypertension: Secondary | ICD-10-CM | POA: Diagnosis not present

## 2016-04-23 DIAGNOSIS — M5489 Other dorsalgia: Secondary | ICD-10-CM | POA: Diagnosis not present

## 2016-04-23 DIAGNOSIS — I739 Peripheral vascular disease, unspecified: Secondary | ICD-10-CM | POA: Diagnosis not present

## 2016-04-26 DIAGNOSIS — I739 Peripheral vascular disease, unspecified: Secondary | ICD-10-CM | POA: Diagnosis not present

## 2016-04-26 DIAGNOSIS — E119 Type 2 diabetes mellitus without complications: Secondary | ICD-10-CM | POA: Diagnosis not present

## 2016-04-26 DIAGNOSIS — M5489 Other dorsalgia: Secondary | ICD-10-CM | POA: Diagnosis not present

## 2016-04-26 DIAGNOSIS — I10 Essential (primary) hypertension: Secondary | ICD-10-CM | POA: Diagnosis not present

## 2016-04-26 DIAGNOSIS — H548 Legal blindness, as defined in USA: Secondary | ICD-10-CM | POA: Diagnosis not present

## 2016-04-29 DIAGNOSIS — E119 Type 2 diabetes mellitus without complications: Secondary | ICD-10-CM | POA: Diagnosis not present

## 2016-04-29 DIAGNOSIS — I739 Peripheral vascular disease, unspecified: Secondary | ICD-10-CM | POA: Diagnosis not present

## 2016-04-29 DIAGNOSIS — H548 Legal blindness, as defined in USA: Secondary | ICD-10-CM | POA: Diagnosis not present

## 2016-04-29 DIAGNOSIS — I10 Essential (primary) hypertension: Secondary | ICD-10-CM | POA: Diagnosis not present

## 2016-04-29 DIAGNOSIS — M5489 Other dorsalgia: Secondary | ICD-10-CM | POA: Diagnosis not present

## 2016-05-01 DIAGNOSIS — E118 Type 2 diabetes mellitus with unspecified complications: Secondary | ICD-10-CM | POA: Diagnosis not present

## 2016-05-01 DIAGNOSIS — H548 Legal blindness, as defined in USA: Secondary | ICD-10-CM | POA: Diagnosis not present

## 2016-05-01 DIAGNOSIS — I739 Peripheral vascular disease, unspecified: Secondary | ICD-10-CM | POA: Diagnosis not present

## 2016-05-01 DIAGNOSIS — E119 Type 2 diabetes mellitus without complications: Secondary | ICD-10-CM | POA: Diagnosis not present

## 2016-05-01 DIAGNOSIS — R443 Hallucinations, unspecified: Secondary | ICD-10-CM | POA: Diagnosis not present

## 2016-05-01 DIAGNOSIS — M5489 Other dorsalgia: Secondary | ICD-10-CM | POA: Diagnosis not present

## 2016-05-01 DIAGNOSIS — Z23 Encounter for immunization: Secondary | ICD-10-CM | POA: Diagnosis not present

## 2016-05-01 DIAGNOSIS — I10 Essential (primary) hypertension: Secondary | ICD-10-CM | POA: Diagnosis not present

## 2016-05-03 DIAGNOSIS — I739 Peripheral vascular disease, unspecified: Secondary | ICD-10-CM | POA: Diagnosis not present

## 2016-05-03 DIAGNOSIS — M5489 Other dorsalgia: Secondary | ICD-10-CM | POA: Diagnosis not present

## 2016-05-03 DIAGNOSIS — I10 Essential (primary) hypertension: Secondary | ICD-10-CM | POA: Diagnosis not present

## 2016-05-03 DIAGNOSIS — E119 Type 2 diabetes mellitus without complications: Secondary | ICD-10-CM | POA: Diagnosis not present

## 2016-05-03 DIAGNOSIS — H548 Legal blindness, as defined in USA: Secondary | ICD-10-CM | POA: Diagnosis not present

## 2016-05-06 DIAGNOSIS — M5489 Other dorsalgia: Secondary | ICD-10-CM | POA: Diagnosis not present

## 2016-05-06 DIAGNOSIS — E119 Type 2 diabetes mellitus without complications: Secondary | ICD-10-CM | POA: Diagnosis not present

## 2016-05-06 DIAGNOSIS — H548 Legal blindness, as defined in USA: Secondary | ICD-10-CM | POA: Diagnosis not present

## 2016-05-06 DIAGNOSIS — I739 Peripheral vascular disease, unspecified: Secondary | ICD-10-CM | POA: Diagnosis not present

## 2016-05-06 DIAGNOSIS — I10 Essential (primary) hypertension: Secondary | ICD-10-CM | POA: Diagnosis not present

## 2016-05-07 DIAGNOSIS — E118 Type 2 diabetes mellitus with unspecified complications: Secondary | ICD-10-CM | POA: Diagnosis not present

## 2016-05-07 DIAGNOSIS — E559 Vitamin D deficiency, unspecified: Secondary | ICD-10-CM | POA: Diagnosis not present

## 2016-05-07 LAB — HEPATIC FUNCTION PANEL
ALK PHOS: 69 (ref 25–125)
ALT: 14 (ref 7–35)
AST: 13 (ref 13–35)
BILIRUBIN, TOTAL: 0.3

## 2016-05-07 LAB — CBC AND DIFFERENTIAL
HCT: 38 (ref 36–46)
Hemoglobin: 12.1 (ref 12.0–16.0)
PLATELETS: 231 (ref 150–399)
WBC: 5.4

## 2016-05-07 LAB — BASIC METABOLIC PANEL
BUN: 23 — AB (ref 4–21)
Creatinine: 1.2 — AB (ref 0.5–1.1)
GLUCOSE: 150
Potassium: 4.9 (ref 3.4–5.3)
Sodium: 144 (ref 137–147)

## 2016-05-07 LAB — VITAMIN D 25 HYDROXY (VIT D DEFICIENCY, FRACTURES): Vit D, 25-Hydroxy: 71.3

## 2016-05-07 LAB — LIPID PANEL
Cholesterol: 127 (ref 0–200)
HDL: 63 (ref 35–70)
LDL CALC: 52
Triglycerides: 58 (ref 40–160)

## 2016-05-07 LAB — HEMOGLOBIN A1C: Hemoglobin A1C: 7.6

## 2016-05-09 DIAGNOSIS — E119 Type 2 diabetes mellitus without complications: Secondary | ICD-10-CM | POA: Diagnosis not present

## 2016-05-09 DIAGNOSIS — H548 Legal blindness, as defined in USA: Secondary | ICD-10-CM | POA: Diagnosis not present

## 2016-05-09 DIAGNOSIS — M5489 Other dorsalgia: Secondary | ICD-10-CM | POA: Diagnosis not present

## 2016-05-09 DIAGNOSIS — I10 Essential (primary) hypertension: Secondary | ICD-10-CM | POA: Diagnosis not present

## 2016-05-09 DIAGNOSIS — I739 Peripheral vascular disease, unspecified: Secondary | ICD-10-CM | POA: Diagnosis not present

## 2016-05-10 DIAGNOSIS — I10 Essential (primary) hypertension: Secondary | ICD-10-CM | POA: Diagnosis not present

## 2016-05-10 DIAGNOSIS — I739 Peripheral vascular disease, unspecified: Secondary | ICD-10-CM | POA: Diagnosis not present

## 2016-05-10 DIAGNOSIS — H548 Legal blindness, as defined in USA: Secondary | ICD-10-CM | POA: Diagnosis not present

## 2016-05-10 DIAGNOSIS — M5489 Other dorsalgia: Secondary | ICD-10-CM | POA: Diagnosis not present

## 2016-05-10 DIAGNOSIS — E119 Type 2 diabetes mellitus without complications: Secondary | ICD-10-CM | POA: Diagnosis not present

## 2016-05-14 DIAGNOSIS — Z0000 Encounter for general adult medical examination without abnormal findings: Secondary | ICD-10-CM | POA: Diagnosis not present

## 2016-05-14 DIAGNOSIS — M5489 Other dorsalgia: Secondary | ICD-10-CM | POA: Diagnosis not present

## 2016-05-14 DIAGNOSIS — D638 Anemia in other chronic diseases classified elsewhere: Secondary | ICD-10-CM | POA: Diagnosis not present

## 2016-05-14 DIAGNOSIS — E1151 Type 2 diabetes mellitus with diabetic peripheral angiopathy without gangrene: Secondary | ICD-10-CM | POA: Diagnosis not present

## 2016-05-14 DIAGNOSIS — I7389 Other specified peripheral vascular diseases: Secondary | ICD-10-CM | POA: Diagnosis not present

## 2016-05-14 DIAGNOSIS — I129 Hypertensive chronic kidney disease with stage 1 through stage 4 chronic kidney disease, or unspecified chronic kidney disease: Secondary | ICD-10-CM | POA: Diagnosis not present

## 2016-05-14 DIAGNOSIS — H548 Legal blindness, as defined in USA: Secondary | ICD-10-CM | POA: Diagnosis not present

## 2016-05-14 DIAGNOSIS — R443 Hallucinations, unspecified: Secondary | ICD-10-CM | POA: Diagnosis not present

## 2016-05-14 DIAGNOSIS — I10 Essential (primary) hypertension: Secondary | ICD-10-CM | POA: Diagnosis not present

## 2016-05-17 DIAGNOSIS — E119 Type 2 diabetes mellitus without complications: Secondary | ICD-10-CM | POA: Diagnosis not present

## 2016-05-17 DIAGNOSIS — M5489 Other dorsalgia: Secondary | ICD-10-CM | POA: Diagnosis not present

## 2016-05-17 DIAGNOSIS — I10 Essential (primary) hypertension: Secondary | ICD-10-CM | POA: Diagnosis not present

## 2016-05-17 DIAGNOSIS — I739 Peripheral vascular disease, unspecified: Secondary | ICD-10-CM | POA: Diagnosis not present

## 2016-05-17 DIAGNOSIS — H548 Legal blindness, as defined in USA: Secondary | ICD-10-CM | POA: Diagnosis not present

## 2016-05-21 ENCOUNTER — Ambulatory Visit (INDEPENDENT_AMBULATORY_CARE_PROVIDER_SITE_OTHER): Payer: Commercial Managed Care - HMO | Admitting: Podiatry

## 2016-05-21 ENCOUNTER — Encounter: Payer: Self-pay | Admitting: Podiatry

## 2016-05-21 VITALS — BP 173/96 | HR 89 | Resp 16

## 2016-05-21 DIAGNOSIS — M79676 Pain in unspecified toe(s): Secondary | ICD-10-CM

## 2016-05-21 DIAGNOSIS — B351 Tinea unguium: Secondary | ICD-10-CM | POA: Diagnosis not present

## 2016-05-21 NOTE — Patient Instructions (Signed)
Diabetes and Foot Care Diabetes may cause you to have problems because of poor blood supply (circulation) to your feet and legs. This may cause the skin on your feet to become thinner, break easier, and heal more slowly. Your skin may become dry, and the skin may peel and crack. You may also have nerve damage in your legs and feet causing decreased feeling in them. You may not notice minor injuries to your feet that could lead to infections or more serious problems. Taking care of your feet is one of the most important things you can do for yourself.  HOME CARE INSTRUCTIONS  Wear shoes at all times, even in the house. Do not go barefoot. Bare feet are easily injured.  Check your feet daily for blisters, cuts, and redness. If you cannot see the bottom of your feet, use a mirror or ask someone for help.  Wash your feet with warm water (do not use hot water) and mild soap. Then pat your feet and the areas between your toes until they are completely dry. Do not soak your feet as this can dry your skin.  Apply a moisturizing lotion or petroleum jelly (that does not contain alcohol and is unscented) to the skin on your feet and to dry, brittle toenails. Do not apply lotion between your toes.  Trim your toenails straight across. Do not dig under them or around the cuticle. File the edges of your nails with an emery board or nail file.  Do not cut corns or calluses or try to remove them with medicine.  Wear clean socks or stockings every day. Make sure they are not too tight. Do not wear knee-high stockings since they may decrease blood flow to your legs.  Wear shoes that fit properly and have enough cushioning. To break in new shoes, wear them for just a few hours a day. This prevents you from injuring your feet. Always look in your shoes before you put them on to be sure there are no objects inside.  Do not cross your legs. This may decrease the blood flow to your feet.  If you find a minor scrape,  cut, or break in the skin on your feet, keep it and the skin around it clean and dry. These areas may be cleansed with mild soap and water. Do not cleanse the area with peroxide, alcohol, or iodine.  When you remove an adhesive bandage, be sure not to damage the skin around it.  If you have a wound, look at it several times a day to make sure it is healing.  Do not use heating pads or hot water bottles. They may burn your skin. If you have lost feeling in your feet or legs, you may not know it is happening until it is too late.  Make sure your health care provider performs a complete foot exam at least annually or more often if you have foot problems. Report any cuts, sores, or bruises to your health care provider immediately. SEEK MEDICAL CARE IF:   You have an injury that is not healing.  You have cuts or breaks in the skin.  You have an ingrown nail.  You notice redness on your legs or feet.  You feel burning or tingling in your legs or feet.  You have pain or cramps in your legs and feet.  Your legs or feet are numb.  Your feet always feel cold. SEEK IMMEDIATE MEDICAL CARE IF:   There is increasing redness,   swelling, or pain in or around a wound.  There is a red line that goes up your leg.  Pus is coming from a wound.  You develop a fever or as directed by your health care provider.  You notice a bad smell coming from an ulcer or wound.   This information is not intended to replace advice given to you by your health care provider. Make sure you discuss any questions you have with your health care provider.   Document Released: 07/26/2000 Document Revised: 03/31/2013 Document Reviewed: 01/05/2013 Elsevier Interactive Patient Education 2016 [employer]  

## 2016-05-21 NOTE — Progress Notes (Signed)
Patient ID: [name], female   DOB: 1945/09/03, 70 y.o.   MRN: [id number]    Subjective: This patient presents today with her son present treatment room. Patient is complaining of painful toenails and walking wearing shoes and request nail debridement. Also, patient is requesting replacement diabetic shoes The diabetic shoes were ordered, however on back order. Patient given option of ordering additional shoe or waiting for the shoe to arrive approximate August 2017. 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 Wylene Simmerisovec For the indication of type II diabetic Absent DP and PT pulses, peripheral arterial disease Hammertoe deformities fifth bilaterally  Submit another request for certification for diabetic shoes  Reappoint 3 months

## 2016-05-23 DIAGNOSIS — F0281 Dementia in other diseases classified elsewhere with behavioral disturbance: Secondary | ICD-10-CM | POA: Diagnosis not present

## 2016-05-23 DIAGNOSIS — G3183 Dementia with Lewy bodies: Secondary | ICD-10-CM | POA: Diagnosis not present

## 2016-05-24 DIAGNOSIS — I739 Peripheral vascular disease, unspecified: Secondary | ICD-10-CM | POA: Diagnosis not present

## 2016-05-24 DIAGNOSIS — H548 Legal blindness, as defined in USA: Secondary | ICD-10-CM | POA: Diagnosis not present

## 2016-05-24 DIAGNOSIS — M5489 Other dorsalgia: Secondary | ICD-10-CM | POA: Diagnosis not present

## 2016-05-24 DIAGNOSIS — E119 Type 2 diabetes mellitus without complications: Secondary | ICD-10-CM | POA: Diagnosis not present

## 2016-05-24 DIAGNOSIS — I10 Essential (primary) hypertension: Secondary | ICD-10-CM | POA: Diagnosis not present

## 2016-07-22 DIAGNOSIS — I129 Hypertensive chronic kidney disease with stage 1 through stage 4 chronic kidney disease, or unspecified chronic kidney disease: Secondary | ICD-10-CM | POA: Diagnosis not present

## 2016-07-22 LAB — HEPATIC FUNCTION PANEL
ALT: 11 (ref 7–35)
AST: 14 (ref 13–35)
Alkaline Phosphatase: 49 (ref 25–125)
Bilirubin, Total: 0.1

## 2016-07-22 LAB — BASIC METABOLIC PANEL
BUN: 11 (ref 4–21)
CREATININE: 1 (ref 0.5–1.1)
GLUCOSE: 163
POTASSIUM: 4.2 (ref 3.4–5.3)
SODIUM: 144 (ref 137–147)

## 2016-07-22 LAB — CBC AND DIFFERENTIAL
HCT: 39 (ref 36–46)
Hemoglobin: 11.7 — AB (ref 12.0–16.0)
Platelets: 213 (ref 150–399)
WBC: 4.9

## 2016-08-20 ENCOUNTER — Encounter (INDEPENDENT_AMBULATORY_CARE_PROVIDER_SITE_OTHER): Payer: Commercial Managed Care - HMO | Admitting: Podiatry

## 2016-08-20 NOTE — Progress Notes (Signed)
This encounter was created in error - please disregard.

## 2016-08-23 DIAGNOSIS — R441 Visual hallucinations: Secondary | ICD-10-CM | POA: Diagnosis not present

## 2016-08-23 DIAGNOSIS — G3183 Dementia with Lewy bodies: Secondary | ICD-10-CM | POA: Diagnosis not present

## 2016-08-23 DIAGNOSIS — F0281 Dementia in other diseases classified elsewhere with behavioral disturbance: Secondary | ICD-10-CM | POA: Diagnosis not present

## 2016-09-09 DIAGNOSIS — D692 Other nonthrombocytopenic purpura: Secondary | ICD-10-CM | POA: Diagnosis not present

## 2016-09-09 DIAGNOSIS — E118 Type 2 diabetes mellitus with unspecified complications: Secondary | ICD-10-CM | POA: Diagnosis not present

## 2016-09-09 DIAGNOSIS — M5489 Other dorsalgia: Secondary | ICD-10-CM | POA: Diagnosis not present

## 2016-09-09 DIAGNOSIS — H548 Legal blindness, as defined in USA: Secondary | ICD-10-CM | POA: Diagnosis not present

## 2016-09-09 DIAGNOSIS — D638 Anemia in other chronic diseases classified elsewhere: Secondary | ICD-10-CM | POA: Diagnosis not present

## 2016-09-09 DIAGNOSIS — R32 Unspecified urinary incontinence: Secondary | ICD-10-CM | POA: Diagnosis not present

## 2016-09-09 DIAGNOSIS — I129 Hypertensive chronic kidney disease with stage 1 through stage 4 chronic kidney disease, or unspecified chronic kidney disease: Secondary | ICD-10-CM | POA: Diagnosis not present

## 2016-09-09 DIAGNOSIS — N3281 Overactive bladder: Secondary | ICD-10-CM | POA: Diagnosis not present

## 2016-11-07 DIAGNOSIS — R413 Other amnesia: Secondary | ICD-10-CM | POA: Diagnosis not present

## 2016-11-15 DIAGNOSIS — R413 Other amnesia: Secondary | ICD-10-CM | POA: Diagnosis not present

## 2016-11-29 DIAGNOSIS — Z803 Family history of malignant neoplasm of breast: Secondary | ICD-10-CM | POA: Diagnosis not present

## 2016-11-29 DIAGNOSIS — Z1231 Encounter for screening mammogram for malignant neoplasm of breast: Secondary | ICD-10-CM | POA: Diagnosis not present

## 2016-12-24 DIAGNOSIS — D692 Other nonthrombocytopenic purpura: Secondary | ICD-10-CM | POA: Diagnosis not present

## 2016-12-24 DIAGNOSIS — H548 Legal blindness, as defined in USA: Secondary | ICD-10-CM | POA: Diagnosis not present

## 2016-12-24 DIAGNOSIS — E1151 Type 2 diabetes mellitus with diabetic peripheral angiopathy without gangrene: Secondary | ICD-10-CM | POA: Diagnosis not present

## 2016-12-24 DIAGNOSIS — N3281 Overactive bladder: Secondary | ICD-10-CM | POA: Diagnosis not present

## 2016-12-24 DIAGNOSIS — I10 Essential (primary) hypertension: Secondary | ICD-10-CM | POA: Diagnosis not present

## 2016-12-24 DIAGNOSIS — E559 Vitamin D deficiency, unspecified: Secondary | ICD-10-CM | POA: Diagnosis not present

## 2016-12-24 DIAGNOSIS — I7389 Other specified peripheral vascular diseases: Secondary | ICD-10-CM | POA: Diagnosis not present

## 2016-12-24 DIAGNOSIS — M5489 Other dorsalgia: Secondary | ICD-10-CM | POA: Diagnosis not present

## 2016-12-24 DIAGNOSIS — I129 Hypertensive chronic kidney disease with stage 1 through stage 4 chronic kidney disease, or unspecified chronic kidney disease: Secondary | ICD-10-CM | POA: Diagnosis not present

## 2016-12-24 LAB — BASIC METABOLIC PANEL
BUN: 29 — AB (ref 4–21)
CREATININE: 1.6 — AB (ref 0.5–1.1)
Glucose: 134
Potassium: 4.6 (ref 3.4–5.3)
Sodium: 143 (ref 137–147)

## 2016-12-24 LAB — LIPID PANEL
CHOLESTEROL: 128 (ref 0–200)
HDL: 56 (ref 35–70)
LDL CALC: 54
Triglycerides: 88 (ref 40–160)

## 2016-12-24 LAB — HEPATIC FUNCTION PANEL
ALK PHOS: 59 (ref 25–125)
ALT: 17 (ref 7–35)
AST: 31 (ref 13–35)
BILIRUBIN, TOTAL: 0.3

## 2016-12-24 LAB — VITAMIN D 25 HYDROXY (VIT D DEFICIENCY, FRACTURES): VIT D 25 HYDROXY: 47.2

## 2017-04-03 DIAGNOSIS — D638 Anemia in other chronic diseases classified elsewhere: Secondary | ICD-10-CM | POA: Diagnosis not present

## 2017-04-03 DIAGNOSIS — Z23 Encounter for immunization: Secondary | ICD-10-CM | POA: Diagnosis not present

## 2017-04-03 DIAGNOSIS — N3281 Overactive bladder: Secondary | ICD-10-CM | POA: Diagnosis not present

## 2017-04-03 DIAGNOSIS — H548 Legal blindness, as defined in USA: Secondary | ICD-10-CM | POA: Diagnosis not present

## 2017-04-03 DIAGNOSIS — I129 Hypertensive chronic kidney disease with stage 1 through stage 4 chronic kidney disease, or unspecified chronic kidney disease: Secondary | ICD-10-CM | POA: Diagnosis not present

## 2017-04-03 DIAGNOSIS — E118 Type 2 diabetes mellitus with unspecified complications: Secondary | ICD-10-CM | POA: Diagnosis not present

## 2017-04-03 DIAGNOSIS — I7389 Other specified peripheral vascular diseases: Secondary | ICD-10-CM | POA: Diagnosis not present

## 2017-04-03 DIAGNOSIS — R443 Hallucinations, unspecified: Secondary | ICD-10-CM | POA: Diagnosis not present

## 2017-04-07 ENCOUNTER — Ambulatory Visit (INDEPENDENT_AMBULATORY_CARE_PROVIDER_SITE_OTHER): Payer: Medicare HMO | Admitting: Podiatry

## 2017-04-07 ENCOUNTER — Encounter: Payer: Self-pay | Admitting: Podiatry

## 2017-04-07 DIAGNOSIS — E1140 Type 2 diabetes mellitus with diabetic neuropathy, unspecified: Secondary | ICD-10-CM | POA: Diagnosis not present

## 2017-04-07 DIAGNOSIS — M79676 Pain in unspecified toe(s): Secondary | ICD-10-CM

## 2017-04-07 DIAGNOSIS — B351 Tinea unguium: Secondary | ICD-10-CM | POA: Diagnosis not present

## 2017-04-07 NOTE — Progress Notes (Signed)
Patient ID: [name], female   DOB: Mar 02, 1946, 71 y.o.   MRN: [id number]   Subjective: This patient presents today with her son present treatment room. Patient is complaining of painful toenails and walking wearing shoes and request nail debridement. Also, patient is requesting replacement diabetic shoes The diabetic shoes were ordered, however on back order. Patient given option of ordering additional shoe or waiting for the shoe to arrive approximate August 2017. 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 Wylene Simmer For the indication of type II diabetic Absent DP and PT pulses, peripheral arterial disease Hammertoe deformities fifth bilaterally  Submit another request for certification for diabetic shoes, pending a history of  Reappoint 3 months

## 2017-04-07 NOTE — Patient Instructions (Signed)

## 2017-04-08 ENCOUNTER — Telehealth: Payer: Self-pay | Admitting: Podiatry

## 2017-04-08 NOTE — Telephone Encounter (Signed)
Spoke to patient to get her scheduled to have diabetic shoe measurements done and at this time she is not able to come in and she will call to schedule the appt when she is available.

## 2017-05-27 DIAGNOSIS — Z6841 Body Mass Index (BMI) 40.0 and over, adult: Secondary | ICD-10-CM | POA: Diagnosis not present

## 2017-05-27 DIAGNOSIS — F05 Delirium due to known physiological condition: Secondary | ICD-10-CM | POA: Diagnosis not present

## 2017-05-27 DIAGNOSIS — E1159 Type 2 diabetes mellitus with other circulatory complications: Secondary | ICD-10-CM | POA: Diagnosis not present

## 2017-05-27 DIAGNOSIS — D692 Other nonthrombocytopenic purpura: Secondary | ICD-10-CM | POA: Diagnosis not present

## 2017-05-27 DIAGNOSIS — I10 Essential (primary) hypertension: Secondary | ICD-10-CM | POA: Diagnosis not present

## 2017-05-27 DIAGNOSIS — R32 Unspecified urinary incontinence: Secondary | ICD-10-CM | POA: Diagnosis not present

## 2017-05-27 DIAGNOSIS — R443 Hallucinations, unspecified: Secondary | ICD-10-CM | POA: Diagnosis not present

## 2017-05-27 DIAGNOSIS — G4709 Other insomnia: Secondary | ICD-10-CM | POA: Diagnosis not present

## 2017-05-27 DIAGNOSIS — I7389 Other specified peripheral vascular diseases: Secondary | ICD-10-CM | POA: Diagnosis not present

## 2017-05-27 LAB — LIPID PANEL
Cholesterol: 113 (ref 0–200)
HDL: 54 (ref 35–70)
LDL Cholesterol: 47
TRIGLYCERIDES: 62 (ref 40–160)

## 2017-05-27 LAB — BASIC METABOLIC PANEL
BUN: 21 (ref 4–21)
CREATININE: 1.4 — AB (ref 0.5–1.1)
GLUCOSE: 103
Potassium: 3.9 (ref 3.4–5.3)
SODIUM: 144 (ref 137–147)

## 2017-05-27 LAB — HEPATIC FUNCTION PANEL
ALT: 9 (ref 7–35)
AST: 11 — AB (ref 13–35)
Alkaline Phosphatase: 53 (ref 25–125)
BILIRUBIN, TOTAL: 0.4

## 2017-05-27 LAB — CBC AND DIFFERENTIAL
HCT: 30 — AB (ref 36–46)
Hemoglobin: 10 — AB (ref 12.0–16.0)
Platelets: 175 (ref 150–399)
WBC: 4.1

## 2017-05-27 LAB — VITAMIN D 25 HYDROXY (VIT D DEFICIENCY, FRACTURES): Vit D, 25-Hydroxy: 41.6

## 2017-05-30 ENCOUNTER — Encounter: Payer: Self-pay | Admitting: Neurology

## 2017-07-08 ENCOUNTER — Ambulatory Visit: Payer: Medicare HMO | Admitting: Podiatry

## 2017-07-08 ENCOUNTER — Ambulatory Visit: Payer: Medicare HMO | Admitting: Orthotics

## 2017-08-18 ENCOUNTER — Ambulatory Visit: Payer: Medicare HMO | Admitting: Podiatry

## 2017-08-18 ENCOUNTER — Ambulatory Visit (INDEPENDENT_AMBULATORY_CARE_PROVIDER_SITE_OTHER): Payer: Medicare HMO | Admitting: Podiatry

## 2017-08-18 ENCOUNTER — Ambulatory Visit: Payer: Medicare HMO | Admitting: Orthotics

## 2017-08-18 ENCOUNTER — Encounter: Payer: Self-pay | Admitting: Podiatry

## 2017-08-18 DIAGNOSIS — M79676 Pain in unspecified toe(s): Secondary | ICD-10-CM

## 2017-08-18 DIAGNOSIS — B351 Tinea unguium: Secondary | ICD-10-CM

## 2017-08-18 DIAGNOSIS — E0843 Diabetes mellitus due to underlying condition with diabetic autonomic (poly)neuropathy: Secondary | ICD-10-CM

## 2017-08-19 ENCOUNTER — Ambulatory Visit: Payer: Medicare HMO | Admitting: Orthotics

## 2017-08-19 ENCOUNTER — Ambulatory Visit: Payer: Medicare HMO | Admitting: Neurology

## 2017-08-19 ENCOUNTER — Encounter: Payer: Self-pay | Admitting: Neurology

## 2017-08-19 ENCOUNTER — Other Ambulatory Visit: Payer: Self-pay | Admitting: Neurology

## 2017-08-19 VITALS — BP 164/76 | HR 84 | Ht 65.0 in | Wt 271.0 lb

## 2017-08-19 DIAGNOSIS — R441 Visual hallucinations: Secondary | ICD-10-CM

## 2017-08-19 DIAGNOSIS — R440 Auditory hallucinations: Secondary | ICD-10-CM | POA: Diagnosis not present

## 2017-08-19 MED ORDER — OLANZAPINE 5 MG PO TABS: 5.0000 mg | ORAL_TABLET | Freq: Every day | ORAL | 5 refills | Status: DC

## 2017-08-19 MED ORDER — QUETIAPINE FUMARATE 100 MG PO TABS: ORAL_TABLET | ORAL | 1 refills | Status: DC

## 2017-08-19 NOTE — Patient Instructions (Addendum)
1. We will start Olanzapine and start tapering down Seroquel: Days 1-4: Start Olanzapine 5mg , 1/2 tablet at night. Reduce Seroquel 100mg , take 1 tablet twice a day Days 5-8: Increase olanzapine 5mg , 1 tablet at night. Reduce Seroquel 100mg , take 1 tablet at night Days 9-12: Increase Olanzapine 5mg , take 1.5 tablets at night. Reduce Seroquel 100mg , take 1/2 tablet at night Day 13: Increase Olanazapine 5mg : take 2 tablets at night. Stop Seroquel.  2. Refer to Psychiatry for hallucinations  [facility]  Please call [phone] to schedule your first appointment.    3. Call our office in a month, we will start Lexapro to help with depression and anxiety  4. Follow-up in 3 months, call for any changes

## 2017-08-19 NOTE — Progress Notes (Signed)
NEUROLOGY CONSULTATION NOTE  [name] MRN: [id number] DOB: [birth year]  Referring provider: Dr. Guerry Bruin Primary care provider: Dr. Guerry Bruin  Reason for consult:  Senile dementia with delusions  Dear Dr Wylene Simmer:  Thank you for your kind referral of [name] for consultation of the above symptoms. Although her history is well known to you, please allow me to reiterate it for the purpose of our medical record. The patient was accompanied to the clinic by her son and daughter-in-law who also provide collateral information. Records and images were personally reviewed where available.   HISTORY OF PRESENT ILLNESS: This is a pleasant 72 year old right-handed woman with a history of diabetes, hypertension, legally blind in both eyes, presenting to establish care. She was previously followed by neurologist Dr. Windle Guard, records were reviewed. Her last visit was in January 2018, diagnosis of hallucinations of undetermined cause, cannot exclude the possibility of Lewy Body disease but less likely due to lack of parkinsonian features. She underwent Neuropsychological testing in April 2018, which interestingly 11/2016 notes that her performance was NOT consistent with a cognitive disorder. Her MoCA (Blind) was 17/22. There was little concern for a neurodegenerative process including LBD. From an emotional standpoint, there appears to be a moderate degree of both depression and anxiety.   Her daughter reports that the visual hallucinations started not too long after her vision decline in 2005. They were not as severe, but over time she has become scared of them, seeing people after her trying to kill her. As her son speaks today, she interjects that "this man is talking clear." When asked bout the hallucinations, she describes seeing chairs, tables, people sitting, walls, white and blue colors. She states she does not see faces, but her daughter reports that a lot of people in  her hallucinations are family members or friends. She says there is an old man telling them her story, that they were friends but not anymore. It appears that in the beginning, hallucinations were more visual, but now she has prolonged conversations with them that can last for up to 4 days where she would not sleep the entire day. The patients states they talk to her but she can't understand them, like they are speaking in whispers/wheezes. Her family describes hallucinations of guns, shooting outside, thinking someone is beating up her son and drugging him or running him over with vehicles. She talks a lot about a little girl called [place] with "a lot of sexual stuff going on." Her family reports that they are so real to her, that even when she is not actively hallucinating, she talks about events like they were real. She would be loud screaming and cursing all day and all night. She would refuse to eat, and if family made her eat, she would say she is in court. Her family reports that it seems she has periods where she is lucid and fine, initially she would be fine every couple of weeks, but now she would go into these prolonged hallucinations every week. She had been fine Christmas time, and only started current episode of hallucinations last night and this morning.   She feels her memory is very good. She moved in with her family in 2007 and completely lost vision in 2008. She has been prescribed Seroquel for the hallucinations, currently on 100mg  in AM, 200mg  in PM with no side effects. She had briefly tried Nuplazid initially, with no response. She is mostly in bed all day and  does not move around much. She needs 1-2 person assist to get around. She can feed herself, but complains so much that her hand is hurting that she can't hold things anymore. She has a history of right arm injury after a fall prior to 2005 where her arm did not heal well. She complains a lot about a knot in her stomach for the past  couple of months.   Diagnostic Data: MRI brain without contrast done 02/2016 [facility] reported mild atrophy, maximum in the temporal lobes, mild small vessel occlusive disease. The ventricles are slightly enlarged without true hydrocephalus. Images unavailable for review.  PAST MEDICAL HISTORY: Past Medical History:  Diagnosis Date  . Blind   . Diabetes mellitus   . Hypertension     PAST SURGICAL HISTORY: Past Surgical History:  Procedure Laterality Date  . INCISION AND DRAINAGE PERIRECTAL ABSCESS  11/20/2011   Procedure: IRRIGATION AND DEBRIDEMENT PERIRECTAL ABSCESS;  Surgeon: Liz Malady, MD;  Location: [facility] OR;  Service: General;  Laterality: Right;  Incision and Drainage of Right Groin Abscess    MEDICATIONS: Current Outpatient Medications on File Prior to Visit  Medication Sig Dispense Refill  . amLODipine (NORVASC) 10 MG tablet Take 10 mg by mouth daily.     Marland Kitchen aspirin EC 81 MG tablet Take 81 mg by mouth daily.    Marland Kitchen atorvastatin (LIPITOR) 20 MG tablet Take 1 tablet (20 mg total) by mouth daily at 6 PM. 30 tablet 5  . CALCIUM PO Take 750 mg by mouth 2 (two) times daily.    . Cholecalciferol 2000 UNITS CAPS Take 1 capsule by mouth every evening.     . cilostazol (PLETAL) 100 MG tablet Take 100 mg by mouth every morning.     . cyclobenzaprine (FLEXERIL) 5 MG tablet Take 1 tablet (5 mg total) by mouth 3 (three) times daily as needed for muscle spasms. 10 tablet 0  . docusate sodium (COLACE) 100 MG capsule Take 100 mg by mouth 2 (two) times daily. Stool softener    . doxycycline (VIBRAMYCIN) 100 MG capsule Take 1 capsule (100 mg total) by mouth 2 (two) times daily. 20 capsule 0  . esomeprazole (NEXIUM) 20 MG capsule Take 20 mg by mouth every morning.    . famotidine (PEPCID) 20 MG tablet Take 1 tablet (20 mg total) by mouth 2 (two) times daily as needed for heartburn or indigestion. 30 tablet 0  . gabapentin (NEURONTIN) 300 MG capsule Take 300 mg by mouth at bedtime.    Marland Kitchen  glimepiride (AMARYL) 4 MG tablet Take 2 mg by mouth 2 (two) times daily before a meal.    . insulin glargine (LANTUS) 100 UNIT/ML injection Inject 45 Units into the skin at bedtime.     . IRON PO Take 1 tablet by mouth every evening.     . labetalol (NORMODYNE) 100 MG tablet Take 100 mg by mouth 2 (two) times daily.     . metFORMIN (GLUCOPHAGE) 1000 MG tablet Take 2,000 mg by mouth daily with breakfast.     . saxagliptin HCl (ONGLYZA) 5 MG TABS tablet Take 5 mg by mouth every morning.     . torsemide (DEMADEX) 20 MG tablet Take 10 mg by mouth every morning.     . valsartan (DIOVAN) 320 MG tablet Take 320 mg by mouth every morning.     No current facility-administered medications on file prior to visit.     ALLERGIES: Allergies  Allergen Reactions  . Oxycodone Other (  See Comments)    "I'm swimming; I'm floating; I'm diving; I'm in another world."    FAMILY HISTORY: Family History  Problem Relation Age of Onset  . Heart disease Mother   . Diabetes Mother   . Cancer Sister        Breast  . Diabetes Sister   . Cancer Brother        Colon  . Diabetes Brother     SOCIAL HISTORY: Social History   Socioeconomic History  . Marital status: Divorced    Spouse name: Not on file  . Number of children: Not on file  . Years of education: Not on file  . Highest education level: Not on file  Social Needs  . Financial resource strain: Not on file  . Food insecurity - worry: Not on file  . Food insecurity - inability: Not on file  . Transportation needs - medical: Not on file  . Transportation needs - non-medical: Not on file  Occupational History  . Not on file  Tobacco Use  . Smoking status: Former Games developermoker  . Smokeless tobacco: Never Used  Substance and Sexual Activity  . Alcohol use: No  . Drug use: No  . Sexual activity: Not on file  Other Topics Concern  . Not on file  Social History Narrative  . Not on file    REVIEW OF SYSTEMS: Constitutional: No fevers, chills, or  sweats, no generalized fatigue, change in appetite Eyes: No visual changes, double vision, eye pain Ear, nose and throat: No hearing loss, ear pain, nasal congestion, sore throat Cardiovascular: No chest pain, palpitations Respiratory:  No shortness of breath at rest or with exertion, wheezes GastrointestinaI: No nausea, vomiting, diarrhea, abdominal pain, fecal incontinence Genitourinary:  No dysuria, urinary retention or frequency Musculoskeletal:  No neck pain, back pain Integumentary: No rash, pruritus, skin lesions Neurological: as above Psychiatric: No depression, insomnia, anxiety Endocrine: No palpitations, fatigue, diaphoresis, mood swings, change in appetite, change in weight, increased thirst Hematologic/Lymphatic:  No anemia, purpura, petechiae. Allergic/Immunologic: no itchy/runny eyes, nasal congestion, recent allergic reactions, rashes  PHYSICAL EXAM: Vitals:   08/19/17 1035  BP: (!) 164/76  Pulse: 84  SpO2: 95%   General: No acute distress, sitting comfortably on wheelchair Head:  Normocephalic/atraumatic Eyes: blind in both eyes, pupils large, irregular, unreactive to light Neck: supple, no paraspinal tenderness, full range of motion Back: No paraspinal tenderness Heart: regular rate and rhythm Lungs: Clear to auscultation bilaterally. Vascular: No carotid bruits. Skin/Extremities: No rash, no edema Neurological Exam: Mental status: alert and oriented to person, place, and time, no dysarthria or aphasia, Fund of knowledge is appropriate.  Recent and remote memory are intact. 2/3 delayed recall. Attention and concentration are normal.    Able to repeat phrases. She is actively hallucinating during the visit, responding to hallucinations on her left side. Cranial nerves: CN I: not tested CN II: pupils irregular, unreactive to light, no light perception CN III, IV, VI:  full range of motion, no nystagmus, no ptosis CN V: facial sensation intact CN VII: upper and  lower face symmetric CN VIII: hearing intact to finger rub CN IX, X: gag intact, uvula midline CN XI: sternocleidomastoid and trapezius muscles intact CN XII: tongue midline Bulk & Tone: normal, no cogwheeling, no fasciculations. Motor: 5/5 throughout (mild difficulty with right UE due to pain), no pronator drift. Sensation: intact to light touch, cold, pin, vibration and joint position sense.  No extinction to double simultaneous stimulation. Deep Tendon Reflexes: +2  throughout, no ankle clonus Plantar responses: downgoing bilaterally Cerebellar: no incoordination on finger to nose testing Gait: not tested Tremor: none in office today  IMPRESSION: This is a pleasant 72 year old right-handed woman with a history of hypertension, diabetes, legally blind in both eyes, presenting to establish care for dementia. Records from her prior neurologist were reviewed, she had presented with significant visual hallucinations, consideration was for Lewy Body dementia, however she had Neuropsychological testing done in April 2018 which was not consistent with a cognitive disorder. Her MoCA (Blind) was 17/22. There was little concern for a neurodegenerative process including LBD. From an emotional standpoint, there appears to be a moderate degree of both depression and anxiety. Now she is having auditory hallucinations as well. MRI brain in 2017 did not show any acute changes, there was atrophy, more in the temporal lobes. We discussed Neuropsych findings, etiology of hallucinations is unclear, if this is more of a primary psychiatric condition rather than a neurodegenerative disorder. Maureen Ralphs syndrome was brought up, however typically this consists of visual hallucinations in the setting of blindness, the auditory hallucinations would be an atypical symptom. She is on a high dose of Seroquel with continued symptoms, she will be transitioned to olanzapine to see if this helps better, cross-taper instructions  and side effects were discussed with family. She will be referred to Psychiatry for the hallucinations. We discussed findings of depression and anxiety on Neuropsych testing, family will call our office in a month for an update with plans to start Lexapro at that point, and hopefully have Psych on board as well. We will repeat Neuropsych testing for interval one year follow-up testing. She will follow-up in 3 months and knows to call for any changes.   Thank you for allowing me to participate in the care of this patient. Please do not hesitate to call for any questions or concerns.   Patrcia Dolly, M.D.  CC: Dr. Wylene Simmer

## 2017-08-20 NOTE — Progress Notes (Signed)
   SUBJECTIVE Patient with a history of diabetes mellitus presents to office today complaining of elongated, thickened nails. Pain while ambulating in shoes. Patient is unable to trim their own nails.   Past Medical History:  Diagnosis Date  . Blind   . Dementia   . Diabetes mellitus   . Hypertension     OBJECTIVE General Patient is awake, alert, and oriented x 3 and in no acute distress. Derm Skin is dry and supple bilateral. Negative open lesions or macerations. Remaining integument unremarkable. Nails are tender, long, thickened and dystrophic with subungual debris, consistent with onychomycosis, 1-5 bilateral. No signs of infection noted. Vasc  DP and PT pedal pulses palpable bilaterally. Temperature gradient within normal limits.  Neuro Epicritic and protective threshold sensation diminished bilaterally.  Musculoskeletal Exam No symptomatic pedal deformities noted bilateral. Muscular strength within normal limits.  ASSESSMENT 1. Diabetes Mellitus w/ peripheral neuropathy 2. Onychomycosis of nail due to dermatophyte bilateral 3. Pain in foot bilateral  PLAN OF CARE 1. Patient evaluated today. 2. Instructed to maintain good pedal hygiene and foot care. Stressed importance of controlling blood sugar.  3. Mechanical debridement of nails 1-5 bilaterally performed using a nail nipper. Filed with dremel without incident.  4. Return to clinic in 3 mos.     Felecia ShellingBrent M. Laquon Emel, DPM [facility]  Dr. Felecia ShellingBrent M. Catalina Salasar, DPM    [address]                                        [place], [place] [address]                Office [phone]  Fax [phone]

## 2017-08-22 ENCOUNTER — Encounter: Payer: Self-pay | Admitting: Neurology

## 2017-08-25 ENCOUNTER — Telehealth: Payer: Self-pay | Admitting: Neurology

## 2017-08-25 NOTE — Telephone Encounter (Signed)
Tywana called regarding [name]. She said they went to the Pharmacy to get her medication filled and they said it needed Prior Auth. Also, she said they needed directions for dispensing it. Please call. Thanks

## 2017-08-25 NOTE — Telephone Encounter (Signed)
Spoke with pharmacy.  Clarified titration instructions.  Pharmacy will contact pt/daughter when Rx available.

## 2017-08-28 DIAGNOSIS — H548 Legal blindness, as defined in USA: Secondary | ICD-10-CM | POA: Diagnosis not present

## 2017-08-28 DIAGNOSIS — E559 Vitamin D deficiency, unspecified: Secondary | ICD-10-CM | POA: Diagnosis not present

## 2017-08-28 DIAGNOSIS — D638 Anemia in other chronic diseases classified elsewhere: Secondary | ICD-10-CM | POA: Diagnosis not present

## 2017-08-28 DIAGNOSIS — D692 Other nonthrombocytopenic purpura: Secondary | ICD-10-CM | POA: Diagnosis not present

## 2017-08-28 DIAGNOSIS — I10 Essential (primary) hypertension: Secondary | ICD-10-CM | POA: Diagnosis not present

## 2017-08-28 DIAGNOSIS — F05 Delirium due to known physiological condition: Secondary | ICD-10-CM | POA: Diagnosis not present

## 2017-08-28 DIAGNOSIS — I7389 Other specified peripheral vascular diseases: Secondary | ICD-10-CM | POA: Diagnosis not present

## 2017-08-28 DIAGNOSIS — M5489 Other dorsalgia: Secondary | ICD-10-CM | POA: Diagnosis not present

## 2017-08-28 DIAGNOSIS — E118 Type 2 diabetes mellitus with unspecified complications: Secondary | ICD-10-CM | POA: Diagnosis not present

## 2017-08-28 DIAGNOSIS — I129 Hypertensive chronic kidney disease with stage 1 through stage 4 chronic kidney disease, or unspecified chronic kidney disease: Secondary | ICD-10-CM | POA: Diagnosis not present

## 2017-08-28 LAB — HEPATIC FUNCTION PANEL
ALK PHOS: 66 (ref 25–125)
ALT: 9 (ref 7–35)
AST: 10 — AB (ref 13–35)
BILIRUBIN, TOTAL: 0.2

## 2017-08-28 LAB — CBC AND DIFFERENTIAL
HEMATOCRIT: 32 — AB (ref 36–46)
Hemoglobin: 9.5 — AB (ref 12.0–16.0)
PLATELETS: 191 (ref 150–399)
WBC: 4.4

## 2017-08-28 LAB — LIPID PANEL
Cholesterol: 128 (ref 0–200)
HDL: 65 (ref 35–70)
LDL CALC: 54
Triglycerides: 46 (ref 40–160)

## 2017-08-28 LAB — BASIC METABOLIC PANEL
BUN: 23 — AB (ref 4–21)
Creatinine: 1.3 — AB (ref 0.5–1.1)
Glucose: 168
Potassium: 5.4 — AB (ref 3.4–5.3)
Sodium: 142 (ref 137–147)

## 2017-08-28 LAB — VITAMIN D 25 HYDROXY (VIT D DEFICIENCY, FRACTURES): Vit D, 25-Hydroxy: 44.9

## 2017-09-15 ENCOUNTER — Other Ambulatory Visit: Payer: Medicare HMO | Admitting: Orthotics

## 2017-09-18 ENCOUNTER — Ambulatory Visit (INDEPENDENT_AMBULATORY_CARE_PROVIDER_SITE_OTHER): Payer: Medicare HMO | Admitting: Orthotics

## 2017-09-18 DIAGNOSIS — M2040 Other hammer toe(s) (acquired), unspecified foot: Secondary | ICD-10-CM | POA: Diagnosis not present

## 2017-09-18 DIAGNOSIS — E0843 Diabetes mellitus due to underlying condition with diabetic autonomic (poly)neuropathy: Secondary | ICD-10-CM | POA: Diagnosis not present

## 2017-09-18 DIAGNOSIS — M79676 Pain in unspecified toe(s): Secondary | ICD-10-CM

## 2017-09-18 NOTE — Progress Notes (Signed)

## 2017-09-22 DIAGNOSIS — M79642 Pain in left hand: Secondary | ICD-10-CM | POA: Diagnosis not present

## 2017-09-22 DIAGNOSIS — Z6841 Body Mass Index (BMI) 40.0 and over, adult: Secondary | ICD-10-CM | POA: Diagnosis not present

## 2017-09-22 DIAGNOSIS — I10 Essential (primary) hypertension: Secondary | ICD-10-CM | POA: Diagnosis not present

## 2017-10-06 ENCOUNTER — Telehealth: Payer: Self-pay

## 2017-10-06 NOTE — Telephone Encounter (Signed)
Left a message asking that new patient packet be returned by 10/07/17.

## 2017-10-07 NOTE — Telephone Encounter (Signed)
Pt daughter called to cancel appt due to patient being in confused state due to dementia. She rescheduled to 10/16/17

## 2017-10-07 NOTE — Telephone Encounter (Signed)
I left a message for patient to call the office regarding new patient appointment and the need to have the new patient packet turned in today if possible.

## 2017-10-09 ENCOUNTER — Ambulatory Visit: Payer: Medicare HMO | Admitting: Nurse Practitioner

## 2017-10-16 ENCOUNTER — Encounter: Payer: Self-pay | Admitting: Nurse Practitioner

## 2017-10-16 ENCOUNTER — Ambulatory Visit (INDEPENDENT_AMBULATORY_CARE_PROVIDER_SITE_OTHER): Payer: Medicare HMO | Admitting: Nurse Practitioner

## 2017-10-16 VITALS — BP 134/78 | HR 65 | Temp 98.7°F | Ht 65.0 in | Wt 272.0 lb

## 2017-10-16 DIAGNOSIS — R5381 Other malaise: Secondary | ICD-10-CM

## 2017-10-16 DIAGNOSIS — M1991 Primary osteoarthritis, unspecified site: Secondary | ICD-10-CM

## 2017-10-16 DIAGNOSIS — K219 Gastro-esophageal reflux disease without esophagitis: Secondary | ICD-10-CM | POA: Diagnosis not present

## 2017-10-16 DIAGNOSIS — R443 Hallucinations, unspecified: Secondary | ICD-10-CM | POA: Diagnosis not present

## 2017-10-16 DIAGNOSIS — M79642 Pain in left hand: Secondary | ICD-10-CM | POA: Diagnosis not present

## 2017-10-16 DIAGNOSIS — I10 Essential (primary) hypertension: Secondary | ICD-10-CM

## 2017-10-16 DIAGNOSIS — K5900 Constipation, unspecified: Secondary | ICD-10-CM | POA: Diagnosis not present

## 2017-10-16 DIAGNOSIS — Z794 Long term (current) use of insulin: Secondary | ICD-10-CM

## 2017-10-16 DIAGNOSIS — E1140 Type 2 diabetes mellitus with diabetic neuropathy, unspecified: Secondary | ICD-10-CM | POA: Diagnosis not present

## 2017-10-16 NOTE — Progress Notes (Signed)
Careteam: Patient Care Team: Sharon Seller, NP as PCP - General (Geriatric Medicine) Van Clines, MD as Consulting Physician (Neurology)  Advanced Directive information    Allergies  Allergen Reactions  . Oxycodone Other (See Comments)    "I'm swimming; I'm floating; I'm diving; I'm in another world."    Chief Complaint  Patient presents with  . Medical Management of Chronic Issues    Pt is being seen to establish care. Previous PCP was [facility].   . Other    Daughter in law, Tywana, in room  . Depression    Pt scored 13 on depression scale     HPI: Patient is a 72 y.o. female seen in the office today to establish care and routine follow up. Fasting today  Last physical was in October (with lab work) , last appt 2-3 weeks ago due to hand concerns.   htn- amlodipine 10 mg daily, labetalol twice daily, benicar and diovan daily for blood pressure and demadex 10 mg by mouth for blood pressure.   Hyperlipidemia- lipitor 20 mg daily  Osteoporosis per daughter in law on calcium 1000 mg BID, no on vit D  No Rx medication.   Constipation- colace 100 mg 2 tablets daily- controlled on medication  GERD- nexium 20 mg by mouth daily which controls symptoms.   Neuropathy- gabapentin 300 mg by mouth daily at bedtime- reports medication does not make tingling better- reports feeling of tingling/rolling sensation that legs are going to bust open. 4/10.  Diabetes- taking lantus 40 units at bedtime, does not take blood sugars. Unsure if diabetes in under control. Daughter feels like it is not under good control. Taking 2000 units at breakfast. In the past in was hard to control blood sugars- lows and high States A1c was high. 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 Following with podiatrist every 3-6 months.   Seeing neurologist and was placed on Zyprexa due to hallucinations. Previously on Seroquel with recent change to zyprexa. Has been wanting her to see a  psychiatrist but daughter in law has not made this appt.  Will have behaviors where she does not know where she is, gets combative and wont eat or drink, will last for days then she will come out of it, will sleep a few days because she has been up for several days- yelling and screaming. Having increase falls. incontinent   Insomnia- reports she is afraid to go to sleep. Hallucinations playing a role in this per daughter.   Following with [facility] due to abnormal pap and they were following them but has not been back in a while.   Very unsteady and off balance. Reports back pain when she came out of the last hallucination last week. Stays in bed most of the time. Has done PT/OT per home health in the past but at this time would not participate.   Main concern is her left hand. Saw previous PCP for this a few weeks ago.  Was instructed to take tylenol. Hx of OA Has been having complaints of swelling, pain can not grip or hold things.  Tylenol relives pain right away and last for about 2 hours.  Not numb or tingling. Effects thumb and center of hand and 2 fingers and wrist.     Review of Systems:  Review of Systems  Constitutional: Negative for chills and fever.  HENT: Positive for hearing loss.   Eyes:       Blind  Respiratory: Negative for cough and shortness of breath.   Cardiovascular: Negative for chest pain and leg swelling.  Gastrointestinal: Negative for abdominal pain, constipation and diarrhea.  Genitourinary: Negative for dysuria and urgency.       Incontinence   Musculoskeletal: Positive for back pain, falls, joint pain and myalgias.  Skin: Negative for itching and rash.  Neurological: Positive for weakness. Negative for dizziness and headaches.  Psychiatric/Behavioral: Positive for depression and hallucinations. Negative for memory loss. The patient is nervous/anxious and has insomnia.     Past Medical History:  Diagnosis Date  . Arthritis   . Blind   .  Dementia   . Diabetes mellitus   . Esophageal reflux   . Hyperlipidemia   . Hypertension   . Osteoarthritis   . Poor circulation   . PVD (peripheral vascular disease) (HCC)   . Retinal detachment 11/2012   Past Surgical History:  Procedure Laterality Date  . CATARACT EXTRACTION, BILATERAL    . INCISION AND DRAINAGE PERIRECTAL ABSCESS  11/20/2011   Procedure: IRRIGATION AND DEBRIDEMENT PERIRECTAL ABSCESS;  Surgeon: Liz MaladyBurke E Thompson, MD;  Location: [facility] OR;  Service: General;  Laterality: Right;  Incision and Drainage of Right Groin Abscess   Social History:   reports that  has never smoked. she has never used smokeless tobacco. She reports that she does not drink alcohol or use drugs.  Family History  Problem Relation Age of Onset  . Heart disease Mother   . Diabetes Mother   . Hyperlipidemia Son   . Hypertension Son     Medications: Patient's Medications  New Prescriptions   No medications on file  Previous Medications   AMLODIPINE (NORVASC) 10 MG TABLET    Take 10 mg by mouth daily.    ASPIRIN EC 81 MG TABLET    Take 81 mg by mouth daily.   ATORVASTATIN (LIPITOR) 20 MG TABLET    Take 1 tablet (20 mg total) by mouth daily at 6 PM.   CALCIUM PO    Take 1,000 mg by mouth 2 (two) times daily.    CILOSTAZOL (PLETAL) 100 MG TABLET    Take 50 mg by mouth every morning.    DOCUSATE SODIUM (COLACE) 100 MG CAPSULE    Take 100 mg by mouth 2 (two) times daily. Stool softener   ESOMEPRAZOLE (NEXIUM) 20 MG CAPSULE    Take 20 mg by mouth every morning.   GABAPENTIN (NEURONTIN) 300 MG CAPSULE    Take 300 mg by mouth at bedtime.   INSULIN GLARGINE (LANTUS) 100 UNIT/ML INJECTION    Inject 40 Units into the skin at bedtime.    IRON PO    Take 1 tablet by mouth every evening.    LABETALOL (NORMODYNE) 100 MG TABLET    Take 100 mg by mouth 2 (two) times daily.    METFORMIN (GLUCOPHAGE) 1000 MG TABLET    Take 2,000 mg by mouth daily with breakfast.    OLANZAPINE (ZYPREXA) 5 MG TABLET    Take 10 mg  by mouth at bedtime.   OLMESARTAN (BENICAR) 40 MG TABLET    Take 40 mg by mouth daily.   TORSEMIDE (DEMADEX) 20 MG TABLET    Take 10 mg by mouth every morning.    VALSARTAN (DIOVAN) 320 MG TABLET    Take 320 mg by mouth every morning.  Modified Medications   No medications on file  Discontinued Medications   No medications on file     Physical Exam:  Vitals:  10/16/17 0856  BP: 134/78  Pulse: 65  Temp: 98.7 F (37.1 C)  TempSrc: Oral  SpO2: 97%  Weight: 272 lb (123.4 kg)  Height: 5\' 5"  (1.651 m)   Body mass index is 45.26 kg/m.  Physical Exam  Constitutional: She is oriented to person, place, and time. She appears well-developed and well-nourished. No distress.  Obese female   HENT:  Head: Normocephalic and atraumatic.  Mouth/Throat: Oropharynx is clear and moist. No oropharyngeal exudate.  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 Neck supple.  Cardiovascular: Normal rate, regular rhythm and normal heart sounds.  Pulmonary/Chest: Effort normal and breath sounds normal.  Abdominal: Soft. Bowel sounds are normal.  Musculoskeletal: She exhibits no edema.       Left wrist: She exhibits tenderness, bony tenderness and swelling. She exhibits normal range of motion.  Neurological: She is alert and oriented to person, place, and time.  Daughter-in-law reports she does not have memory issues however during OV they disagree on multiple things during visit. Example- pt reports tylenol completely relieves pain from OA but daughter in law states she always complains that it never works and she can not even tell that she has taken it.   Skin: Skin is warm and dry. She is not diaphoretic.  Psychiatric: She has a normal mood and affect.    Labs reviewed: Basic Metabolic Panel: No results for input(s): NA, K, CL, CO2, GLUCOSE, BUN, CREATININE, CALCIUM, MG, PHOS, TSH in the last 8760 hours. Liver Function Tests: No results for input(s): AST, ALT, ALKPHOS,  BILITOT, PROT, ALBUMIN in the last 8760 hours. No results for input(s): LIPASE, AMYLASE in the last 8760 hours. No results for input(s): AMMONIA in the last 8760 hours. CBC: No results for input(s): WBC, NEUTROABS, HGB, HCT, MCV, PLT in the last 8760 hours. Lipid Panel: No results for input(s): CHOL, HDL, LDLCALC, TRIG, CHOLHDL, LDLDIRECT in the last 8760 hours. TSH: No results for input(s): TSH in the last 8760 hours. A1C: Lab Results  Component Value Date   HGBA1C 7.6 (H) 11/17/2011     Assessment/Plan 1. Essential hypertension, benign -controlled on current regimen. - Lipid Panel - CBC with Differential/Platelets  2. Gastroesophageal reflux disease without esophagitis Stable on esomeprazole.   3. Type 2 diabetes mellitus with diabetic neuropathy, with long-term current use of insulin (HCC) -reports blood sugars at not well controlled and are not checking them at home. Continues on metformin 2000 mg daily with lantus 40 units daily -needs to be checking blood sugars more consistently  -continues on gabapentin 300 mg by mouth at bedtime.  - Hemoglobin A1c  4. Debility -progressive decline due to staying in bed for multiple days in a row and decrease mobility. Very unsteady and daughter-in-law reports it is hard to manage and if she falls she has to call her husband to help get her up. Pt will not participate in PT/OT. They are using assistive devices and has BSC in place.   5. Primary osteoarthritis, unspecified site --to continue tylenol twice daily  - COMPLETE METABOLIC PANEL WITH GFR  7. Constipation, unspecified constipation type -stable on colace, cont to encourage proper hydration  8. Hallucination -has been following with neurology with recent change in medication from Seroquel to Zyprexa, she has been recommended to follow up with psychiatrist but daughter-in-law reports she has not followed through with this. Based on hx of being awake for days with increase in  behaviors and then sleeping for days sounds suspicious  for other psychiatric disorders and needs psychiatrist for management. Strongly encouraged to make appt with psychiatrist  - TSH  9. Left hand pain Pt with multiple falls due to debility and ongoing wrist pain, taking tylenol arthritis daily can increase to twice daily and to use Aspercreme with lidocaine TID PRN - DG Wrist Complete Left; Future - DG Hand Complete Left; Future  Next appt: 12/01/2017 -last physical was in October per daughter-in-law- will get MMSE  Shanda Bumps K. Biagio Borg  [facility] [phone]

## 2017-10-16 NOTE — Patient Instructions (Addendum)
To use tylenol twice daily for hand pain To use asper cream with lidocaine three times daily for pain in hand Will get xray to evaluate  To make appt with psychiatrist - Archer AsaPlovsky Gerald MD- is geropsych Address: [address], [place], [place] [address] Phone: [phone]  Follow up in 6 weeks for follow up

## 2017-10-17 ENCOUNTER — Telehealth: Payer: Self-pay

## 2017-10-17 LAB — CBC WITH DIFFERENTIAL/PLATELET
Basophils Absolute: 29 cells/uL (ref 0–200)
Basophils Relative: 0.7 %
EOS ABS: 131 {cells}/uL (ref 15–500)
EOS PCT: 3.2 %
HCT: 30.7 % — ABNORMAL LOW (ref 35.0–45.0)
Hemoglobin: 10.1 g/dL — ABNORMAL LOW (ref 11.7–15.5)
Lymphs Abs: 1706 cells/uL (ref 850–3900)
MCH: 29.5 pg (ref 27.0–33.0)
MCHC: 32.9 g/dL (ref 32.0–36.0)
MCV: 89.8 fL (ref 80.0–100.0)
MPV: 11.7 fL (ref 7.5–12.5)
Monocytes Relative: 7.3 %
NEUTROS ABS: 1935 {cells}/uL (ref 1500–7800)
NEUTROS PCT: 47.2 %
PLATELETS: 212 10*3/uL (ref 140–400)
RBC: 3.42 10*6/uL — ABNORMAL LOW (ref 3.80–5.10)
RDW: 14.3 % (ref 11.0–15.0)
TOTAL LYMPHOCYTE: 41.6 %
WBC mixed population: 299 cells/uL (ref 200–950)
WBC: 4.1 10*3/uL (ref 3.8–10.8)

## 2017-10-17 LAB — COMPLETE METABOLIC PANEL WITH GFR
AG Ratio: 1.5 (calc) (ref 1.0–2.5)
ALT: 11 U/L (ref 6–29)
AST: 12 U/L (ref 10–35)
Albumin: 3.6 g/dL (ref 3.6–5.1)
Alkaline phosphatase (APISO): 53 U/L (ref 33–130)
BILIRUBIN TOTAL: 0.4 mg/dL (ref 0.2–1.2)
BUN / CREAT RATIO: 15 (calc) (ref 6–22)
BUN: 19 mg/dL (ref 7–25)
CHLORIDE: 108 mmol/L (ref 98–110)
CO2: 30 mmol/L (ref 20–32)
Calcium: 10 mg/dL (ref 8.6–10.4)
Creat: 1.25 mg/dL — ABNORMAL HIGH (ref 0.60–0.93)
GFR, EST AFRICAN AMERICAN: 49 mL/min/{1.73_m2} — AB (ref >=60)
GFR, EST NON AFRICAN AMERICAN: 43 mL/min/{1.73_m2} — AB (ref >=60)
GLOBULIN: 2.4 g/dL (ref 1.9–3.7)
Glucose, Bld: 123 mg/dL — ABNORMAL HIGH (ref 65–99)
POTASSIUM: 4.1 mmol/L (ref 3.5–5.3)
SODIUM: 144 mmol/L (ref 135–146)
TOTAL PROTEIN: 6 g/dL — AB (ref 6.1–8.1)

## 2017-10-17 LAB — LIPID PANEL
CHOLESTEROL: 121 mg/dL (ref <200)
HDL: 59 mg/dL (ref >50)
LDL Cholesterol (Calc): 46 mg/dL (calc)
Non-HDL Cholesterol (Calc): 62 mg/dL (calc) (ref <130)
Total CHOL/HDL Ratio: 2.1 (calc) (ref <5.0)
Triglycerides: 78 mg/dL (ref <150)

## 2017-10-17 LAB — HEMOGLOBIN A1C
EAG (MMOL/L): 10.8 (calc)
Hgb A1c MFr Bld: 8.4 % of total Hgb — ABNORMAL HIGH (ref <5.7)
MEAN PLASMA GLUCOSE: 194 (calc)

## 2017-10-17 LAB — TSH: TSH: 2 m[IU]/L (ref 0.40–4.50)

## 2017-10-17 NOTE — Telephone Encounter (Signed)
I spoke with patient's daughter and she verbalized understanding. An appointment was scheduled with [name].

## 2017-10-17 NOTE — Telephone Encounter (Signed)
Notes recorded by Chriss DriverLane, Eyla Tallon L, CMA on 10/17/2017 at 12:00 PM EST I left a message for patient's daughter to call the office. ------  Notes recorded by Chriss DriverLane, Leonidas Boateng L, CMA on 10/17/2017 at 11:59 AM EST Correction: patient does have chronic kidney disease per Shanda BumpsJessica ------  Notes recorded by Sharon SellerEubanks, Jessica K, NP on 10/17/2017 at 11:47 AM EST Cholesterol at goal, kidney function is stable (based on labs that were in epic system) but she does not chronic kidney disease. hgb slightly low, will compare to more recent labs done from previous PCP once we get those records. A1c is not at goal at 8.4, lets have them check blood sugars twice daily (fasting and before dinner or bedtime) and record. To follow up with Edison Paceathey Miller Pharm D in 2-3 weeks for review. Thyroid test at goal.

## 2017-10-24 ENCOUNTER — Telehealth: Payer: Self-pay | Admitting: *Deleted

## 2017-10-24 NOTE — Telephone Encounter (Signed)
Received FMLA Paperwork for daughter [name]. Placed in [name]'s folder to review, fill out and sign. Please call #[phone] once completed.   Employee Name:Sean D Gallaga Case Number:[id number] Employee #: [id number]  Once completed return to: [facility]  Fax: [phone]

## 2017-10-28 ENCOUNTER — Telehealth: Payer: Self-pay

## 2017-10-28 DIAGNOSIS — Z029 Encounter for administrative examinations, unspecified: Secondary | ICD-10-CM

## 2017-10-28 NOTE — Telephone Encounter (Signed)
I called patient's son and daughter to discuss FMLA paperwork. I left a message asking them to call the office.    The forms they dropped off are for Employee leave of absence, not FLMA forms. I called company and spoke with Chelsie (FMLA and LOA Coordinator) and was told that the forms we had on hand were ok to complete for re certification. She also told me to strike through question # 6 on the form because it would not apply to patient. Forms will be completed by provider tomorrow.

## 2017-10-29 NOTE — Telephone Encounter (Signed)
Contact information for Chelsie, FMLA and [employer] Corporation[employer] Coordinator  [facility]  Phone: [phone]  Fax: [phone]

## 2017-10-29 NOTE — Telephone Encounter (Signed)
I spoke with patient's daughter-in-law, Thornton Parkywana, to let her know that I would fax completed FMLA forms to [facility] once they are completed. She verbalized agreement.

## 2017-10-29 NOTE — Telephone Encounter (Signed)
I left a message for [name] letting her know that the FMLA forms have [facility] and there is a copy these forms in the front filing cabinet for pick up.

## 2017-11-05 ENCOUNTER — Ambulatory Visit: Payer: Self-pay | Admitting: Nurse Practitioner

## 2017-11-10 ENCOUNTER — Ambulatory Visit
Admission: RE | Admit: 2017-11-10 | Discharge: 2017-11-10 | Disposition: A | Discharge status: Home / Self Care | Payer: Medicare HMO | Source: Ambulatory Visit | Attending: Nurse Practitioner | Admitting: Nurse Practitioner

## 2017-11-10 DIAGNOSIS — M19042 Primary osteoarthritis, left hand: Secondary | ICD-10-CM | POA: Diagnosis not present

## 2017-11-10 DIAGNOSIS — M79642 Pain in left hand: Secondary | ICD-10-CM

## 2017-11-10 DIAGNOSIS — M19032 Primary osteoarthritis, left wrist: Secondary | ICD-10-CM | POA: Diagnosis not present

## 2017-11-17 ENCOUNTER — Ambulatory Visit: Payer: Self-pay | Admitting: Pharmacotherapy

## 2017-11-17 ENCOUNTER — Encounter: Payer: Medicare HMO | Admitting: Podiatry

## 2017-11-17 ENCOUNTER — Ambulatory Visit: Payer: Medicare HMO | Admitting: Pharmacotherapy

## 2017-11-24 NOTE — Progress Notes (Signed)
This encounter was created in error - please disregard.

## 2017-11-26 ENCOUNTER — Ambulatory Visit: Payer: Medicare HMO | Admitting: Neurology

## 2017-12-01 ENCOUNTER — Encounter: Payer: Self-pay | Admitting: Nurse Practitioner

## 2017-12-01 ENCOUNTER — Ambulatory Visit (INDEPENDENT_AMBULATORY_CARE_PROVIDER_SITE_OTHER): Payer: Medicare HMO | Admitting: Nurse Practitioner

## 2017-12-01 VITALS — BP 132/80 | HR 72 | Temp 98.6°F | Ht 65.0 in | Wt 267.0 lb

## 2017-12-01 DIAGNOSIS — Z1231 Encounter for screening mammogram for malignant neoplasm of breast: Secondary | ICD-10-CM

## 2017-12-01 DIAGNOSIS — E2839 Other primary ovarian failure: Secondary | ICD-10-CM

## 2017-12-01 DIAGNOSIS — M1991 Primary osteoarthritis, unspecified site: Secondary | ICD-10-CM | POA: Diagnosis not present

## 2017-12-01 DIAGNOSIS — E785 Hyperlipidemia, unspecified: Secondary | ICD-10-CM | POA: Diagnosis not present

## 2017-12-01 DIAGNOSIS — R443 Hallucinations, unspecified: Secondary | ICD-10-CM

## 2017-12-01 DIAGNOSIS — Z794 Long term (current) use of insulin: Secondary | ICD-10-CM

## 2017-12-01 DIAGNOSIS — E1140 Type 2 diabetes mellitus with diabetic neuropathy, unspecified: Secondary | ICD-10-CM

## 2017-12-01 DIAGNOSIS — R5381 Other malaise: Secondary | ICD-10-CM

## 2017-12-01 DIAGNOSIS — I10 Essential (primary) hypertension: Secondary | ICD-10-CM

## 2017-12-01 NOTE — Patient Instructions (Addendum)
6 week follow up for blood sugar check.  Please come to office for lab prior to appt Bring blood sugar log to next appt Take blood sugar fasting and occasional before dinner or bed.   Increase protein in diet, decrease sugar Whole wheat carbohydrates   Diabetes Mellitus and Nutrition When you have diabetes (diabetes mellitus), it is very important to have healthy eating habits because your blood sugar (glucose) levels are greatly affected by what you eat and drink. Eating healthy foods in the appropriate amounts, at about the same times every day, can help you:  Control your blood glucose.  Lower your risk of heart disease.  Improve your blood pressure.  Reach or maintain a healthy weight.  Every person with diabetes is different, and each person has different needs for a meal plan. Your health care provider may recommend that you work with a diet and nutrition specialist (dietitian) to make a meal plan that is best for you. Your meal plan may vary depending on factors such as:  The calories you need.  The medicines you take.  Your weight.  Your blood glucose, blood pressure, and cholesterol levels.  Your activity level.  Other health conditions you have, such as heart or kidney disease.  How do carbohydrates affect me? Carbohydrates affect your blood glucose level more than any other type of food. Eating carbohydrates naturally increases the amount of glucose in your blood. Carbohydrate counting is a method for keeping track of how many carbohydrates you eat. Counting carbohydrates is important to keep your blood glucose at a healthy level, especially if you use insulin or take certain oral diabetes medicines. It is important to know how many carbohydrates you can safely have in each meal. This is different for every person. Your dietitian can help you calculate how many carbohydrates you should have at each meal and for snack. Foods that contain carbohydrates include:  Bread,  cereal, rice, pasta, and crackers.  Potatoes and corn.  Peas, beans, and lentils.  Milk and yogurt.  Fruit and juice.  Desserts, such as cakes, cookies, ice cream, and candy.  How does alcohol affect me? Alcohol can cause a sudden decrease in blood glucose (hypoglycemia), especially if you use insulin or take certain oral diabetes medicines. Hypoglycemia can be a life-threatening condition. Symptoms of hypoglycemia (sleepiness, dizziness, and confusion) are similar to symptoms of having too much alcohol. If your health care provider says that alcohol is safe for you, follow these guidelines:  Limit alcohol intake to no more than 1 drink per day for nonpregnant women and 2 drinks per day for men. One drink equals 12 oz of beer, 5 oz of wine, or 1 oz of hard liquor.  Do not drink on an empty stomach.  Keep yourself hydrated with water, diet soda, or unsweetened iced tea.  Keep in mind that regular soda, juice, and other mixers may contain a lot of sugar and must be counted as carbohydrates.  What are tips for following this plan? Reading food labels  Start by checking the serving size on the label. The amount of calories, carbohydrates, fats, and other nutrients listed on the label are based on one serving of the food. Many foods contain more than one serving per package.  Check the total grams (g) of carbohydrates in one serving. You can calculate the number of servings of carbohydrates in one serving by dividing the total carbohydrates by 15. For example, if a food has 30 g of total carbohydrates, it  would be equal to 2 servings of carbohydrates.  Check the number of grams (g) of saturated and trans fats in one serving. Choose foods that have low or no amount of these fats.  Check the number of milligrams (mg) of sodium in one serving. Most people should limit total sodium intake to less than 2,300 mg per day.  Always check the nutrition information of foods labeled as "low-fat"  or "nonfat". These foods may be higher in added sugar or refined carbohydrates and should be avoided.  Talk to your dietitian to identify your daily goals for nutrients listed on the label. Shopping  Avoid buying canned, premade, or processed foods. These foods tend to be high in fat, sodium, and added sugar.  Shop around the outside edge of the grocery store. This includes fresh fruits and vegetables, bulk grains, fresh meats, and fresh dairy. Cooking  Use low-heat cooking methods, such as baking, instead of high-heat cooking methods like deep frying.  Cook using healthy oils, such as olive, canola, or sunflower oil.  Avoid cooking with butter, cream, or high-fat meats. Meal planning  Eat meals and snacks regularly, preferably at the same times every day. Avoid going long periods of time without eating.  Eat foods high in fiber, such as fresh fruits, vegetables, beans, and whole grains. Talk to your dietitian about how many servings of carbohydrates you can eat at each meal.  Eat 4-6 ounces of lean protein each day, such as lean meat, chicken, fish, eggs, or tofu. 1 ounce is equal to 1 ounce of meat, chicken, or fish, 1 egg, or 1/4 cup of tofu.  Eat some foods each day that contain healthy fats, such as avocado, nuts, seeds, and fish. Lifestyle   Check your blood glucose regularly.  Exercise at least 30 minutes 5 or more days each week, or as told by your health care provider.  Take medicines as told by your health care provider.  Do not use any products that contain nicotine or tobacco, such as cigarettes and e-cigarettes. If you need help quitting, ask your health care provider.  Work with a Veterinary surgeon or diabetes educator to identify strategies to manage stress and any emotional and social challenges. What are some questions to ask my health care provider?  Do I need to meet with a diabetes educator?  Do I need to meet with a dietitian?  What number can I call if I have  questions?  When are the best times to check my blood glucose? Where to find more information:  American Diabetes Association: diabetes.org/food-and-fitness/food  Academy of Nutrition and Dietetics: https://www.vargas.com/  [employer] of Diabetes and Digestive and Kidney Diseases (NIH): FindJewelers.cz Summary  A healthy meal plan will help you control your blood glucose and maintain a healthy lifestyle.  Working with a diet and nutrition specialist (dietitian) can help you make a meal plan that is best for you.  Keep in mind that carbohydrates and alcohol have immediate effects on your blood glucose levels. It is important to count carbohydrates and to use alcohol carefully. This information is not intended to replace advice given to you by your health care provider. Make sure you discuss any questions you have with your health care provider. Document Released: 04/25/2005 Document Revised: 09/02/2016 Document Reviewed: 09/02/2016 Elsevier Interactive Patient Education  [employer].

## 2017-12-01 NOTE — Progress Notes (Signed)
Careteam: Patient Care Team: Lauree Chandler, NP as PCP - General (Geriatric Medicine) Cameron Sprang, MD as Consulting Physician (Neurology)  Advanced Directive information    Allergies  Allergen Reactions  . Oxycodone Other (See Comments)    "I'm swimming; I'm floating; I'm diving; I'm in another world."    Chief Complaint  Patient presents with  . Medical Management of Chronic Issues    Pt is being seen for a 6 week follow up on chronic conditions. Pt and son report that hallucinations have gotten much worse.   . Other    Son in room     HPI: Patient is a 72 y.o. female seen in the office today for routine follow up.  Pt with hx of hallucinations, constipation, OA of multiple joints, uncontrolled diabetes with blindness, insomnia, GERD, OP, HTN  DM- last a1c 8.4 on metformin 2000 mg daily with lantus 40 units daily.  Does not have blood sugar reading. Missed appt with Cathey due to hallucinations. Reports fasting blood sugars are 120-135. Neuropathy stable on gabapentin 300 mg by mouth at bedtime. See podiatrist, wears diabetic shoes, last exam was in January 2019  Hallucinations- are getting worse, son is unsure if she has appt with psychiatrist.  Reports his wife (daugher-in-law) is taking care of this. Taking Zyprexa 5 mg- reports she is taking 1 tablet in the morning and 2 in the evening- which was directed by neurologist.   HTN- stable on labetalol, benicar, amlodipine and Demadex.   Increased incontinence- reports she can not hold her urine, sometimes does not even get a urge, tries to go when she has a urge, sometimes when she coughs urine comes out.   Ongoing debility, today she reports she needs PT but in the past has not been compliant, the barrier is her hallucinations which keep her from doing things.  Son states that if she is hallucinating she will think she has fallen but he has not actually seen her fall.     OA- controlled on tylenol twice daily     Review of Systems:  Review of Systems  Constitutional: Negative for chills and fever.  HENT: Positive for hearing loss.   Eyes:       Blind  Respiratory: Negative for cough and shortness of breath.   Cardiovascular: Negative for chest pain and leg swelling.  Gastrointestinal: Negative for abdominal pain, constipation and diarrhea.  Genitourinary: Negative for dysuria and urgency.       Incontinence   Musculoskeletal: Positive for back pain, falls (none since last OV), joint pain and myalgias.       Pain currently controlled with tylenol  Skin: Negative for itching and rash.  Neurological: Positive for weakness. Negative for dizziness and headaches.  Psychiatric/Behavioral: Positive for depression and hallucinations. Negative for memory loss. The patient is nervous/anxious and has insomnia.     Past Medical History:  Diagnosis Date  . Anemia of chronic disease   . Arthritis   . Back pain, chronic   . Blind   . Candida infection of mouth   . Diabetes mellitus   . Esophageal reflux   . GERD (gastroesophageal reflux disease)   . Hallucination   . Hand pain, left   . Hyperlipidemia   . Hypertension   . Hypertensive renal disease   . Insomnia   . Morbid obesity ([place])   . OAB (overactive bladder)   . Osteoarthritis    left shoulder  . Poor circulation   .  PVD (peripheral vascular disease) ([place])   . Retinal detachment 11/2012  . Senile dementia with delusional features ([place])   . Senile purpura ([place])   . Vitamin D deficiency    Past Surgical History:  Procedure Laterality Date  . CATARACT EXTRACTION, BILATERAL    . INCISION AND DRAINAGE PERIRECTAL ABSCESS  11/20/2011   Procedure: IRRIGATION AND DEBRIDEMENT PERIRECTAL ABSCESS;  Surgeon: Zenovia Jarred, MD;  Location: [facility];  Service: General;  Laterality: Right;  Incision and Drainage of Right Groin Abscess   Social History:   reports that she quit smoking about 41 years ago. Her smoking use included cigarettes. She  has a 3.75 pack-year smoking history. She has never used smokeless tobacco. She reports that she does not drink alcohol or use drugs.  Family History  Problem Relation Age of Onset  . Heart disease Mother   . Diabetes Mother   . Hyperlipidemia Son   . Hypertension Son     Medications: Patient's Medications  New Prescriptions   No medications on file  Previous Medications   AMLODIPINE (NORVASC) 10 MG TABLET    Take 10 mg by mouth daily.    ASPIRIN EC 81 MG TABLET    Take 81 mg by mouth daily.   ATORVASTATIN (LIPITOR) 20 MG TABLET    Take 1 tablet (20 mg total) by mouth daily at 6 PM.   CALCIUM PO    Take 1,000 mg by mouth 2 (two) times daily.    CILOSTAZOL (PLETAL) 100 MG TABLET    Take 50 mg by mouth every morning.    DOCUSATE SODIUM (COLACE) 100 MG CAPSULE    Take 100 mg by mouth 2 (two) times daily. Stool softener   ESOMEPRAZOLE (NEXIUM) 20 MG CAPSULE    Take 20 mg by mouth every morning.   GABAPENTIN (NEURONTIN) 300 MG CAPSULE    Take 300 mg by mouth at bedtime.   INSULIN GLARGINE (LANTUS) 100 UNIT/ML INJECTION    Inject 40 Units into the skin at bedtime.    IRON PO    Take 1 tablet by mouth every evening.    LABETALOL (NORMODYNE) 100 MG TABLET    Take 100 mg by mouth 2 (two) times daily.    METFORMIN (GLUCOPHAGE) 1000 MG TABLET    Take 2,000 mg by mouth daily with breakfast.    OLANZAPINE (ZYPREXA) 5 MG TABLET    Take 10 mg by mouth at bedtime.   OLMESARTAN (BENICAR) 40 MG TABLET    Take 40 mg by mouth daily.   TORSEMIDE (DEMADEX) 20 MG TABLET    Take 10 mg by mouth every morning.   Modified Medications   No medications on file  Discontinued Medications   VALSARTAN (DIOVAN) 320 MG TABLET    Take 320 mg by mouth every morning.     Physical Exam:  Vitals:   12/01/17 1115  BP: 132/80  Pulse: 72  Temp: 98.6 F (37 C)  TempSrc: Oral  SpO2: 99%  Weight: 267 lb (121.1 kg)  Height: _0  (1.651 m)   Body mass index is 44.43 kg/m.  Physical Exam  Constitutional: She  is oriented to person, place, and time. She appears well-developed and well-nourished. No distress.  Obese female   HENT:  Head: Normocephalic and atraumatic.  Mouth/Throat: Oropharynx is clear and moist. No oropharyngeal exudate.  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 Neck supple.  Cardiovascular: Normal rate, regular rhythm and normal heart sounds.  Pulmonary/Chest: Effort normal and breath sounds normal.  Abdominal: Soft. Bowel sounds are normal.  Musculoskeletal: She exhibits tenderness (to bilateral wrist). She exhibits no edema.  Neurological: She is alert and oriented to person, place, and time.  Skin: Skin is warm and dry. She is not diaphoretic.  Psychiatric: She has a normal mood and affect.    Labs reviewed: Basic Metabolic Panel: Recent Labs    05/27/17 08/28/17 10/16/17 1007  NA 144 142 144  K 3.9 5.4* 4.1  CL  --   --  108  CO2  --   --  30  GLUCOSE  --   --  123*  BUN 21 23* 19  CREATININE 1.4* 1.3* 1.25*  CALCIUM  --   --  10.0  TSH  --   --  2.00   Liver Function Tests: Recent Labs    12/24/16 05/27/17 08/28/17 10/16/17 1007  AST 31 11* 10* 12  ALT _0 ALKPHOS 59 53 66  --   BILITOT  --   --   --  0.4  PROT  --   --   --  6.0*   No results for input(s): LIPASE, AMYLASE in the last 8760 hours. No results for input(s): AMMONIA in the last 8760 hours. CBC: Recent Labs    05/27/17 08/28/17 10/16/17 1007  WBC 4.1 4.4 4.1  NEUTROABS  --   --  1,935  HGB 10.0* 9.5* 10.1*  HCT 30* 32* 30.7*  MCV  --   --  89.8  PLT 175 191 212   Lipid Panel: Recent Labs    05/27/17 08/28/17 10/16/17 1007  CHOL 113 128 121  HDL 54 65 59  LDLCALC 47 54 46  TRIG 62 46 78  CHOLHDL  --   --  2.1   TSH: Recent Labs    10/16/17 1007  TSH 2.00   A1C: Lab Results  Component Value Date   HGBA1C 8.4 (H) 10/16/2017     Assessment/Plan 1. Screening mammogram, encounter for - MM Digital Screening; Future  2. Estrogen  deficiency - DG Bone Density; Future  3. Debility -ongoing debility, pt willing and wishes to work with home health PT/OT at this time, however son reports due to hx of hallucinations he is not sure how compliant she will actually be with this and could create barriers for getting therapy done.  - Ambulatory referral to [facility] for gait and strength training.   4. Essential hypertension, benign Controlled on current regimen, continue medication with diet modifications.   5. Type 2 diabetes mellitus with diabetic neuropathy, with long-term current use of insulin ([place]) -Missed appt with Cathey due to hallucinations and does not have blood sugar log here today. Compliant with medications. Discussed dietary changes, increasing protein and decreasing sugar intake. Hand-outs given - Hemoglobin A1c; Future - BMP with eGFR([facility]); Future  6. Primary osteoarthritis, unspecified site Controlled with tylenol twice daily  7. Hallucination -ongoing worsening hallucinations, son reports his wife, pts daughter-in-law is looking into getting her with psychiatry, ongoing follow up with neurology as well. Based on neurology last note taking olanzapine 5 mg 2 tablets at bedtime. They do not have medication bottle at today's visit.   8. Hyperlipidemia, unspecified hyperlipidemia type LDL at goal on atorvastatin    Next appt: 6 weeks for follow up on diabetes, labs before visit Lamar Naef K. [place], [facility] [phone]

## 2017-12-10 ENCOUNTER — Encounter: Payer: Self-pay | Admitting: Nurse Practitioner

## 2017-12-10 DIAGNOSIS — D649 Anemia, unspecified: Secondary | ICD-10-CM | POA: Insufficient documentation

## 2017-12-10 DIAGNOSIS — E559 Vitamin D deficiency, unspecified: Secondary | ICD-10-CM | POA: Insufficient documentation

## 2017-12-10 DIAGNOSIS — I129 Hypertensive chronic kidney disease with stage 1 through stage 4 chronic kidney disease, or unspecified chronic kidney disease: Secondary | ICD-10-CM | POA: Insufficient documentation

## 2017-12-22 ENCOUNTER — Telehealth: Payer: Self-pay | Admitting: Nurse Practitioner

## 2017-12-22 NOTE — Telephone Encounter (Signed)
Left msg asking pt to confirm this AWV-S w/ nurse. I also explained that labs can be done this day. Last AWV 05/14/16.

## 2018-01-01 ENCOUNTER — Other Ambulatory Visit: Payer: Self-pay

## 2018-01-01 MED ORDER — "INSULIN SYRINGE 31G X 5/16"" 1 ML MISC": 1.0000 | Freq: Every day | 11 refills | Status: AC

## 2018-01-01 MED ORDER — INSULIN GLARGINE 100 UNIT/ML ~~LOC~~ SOLN
40.0000 [IU] | Freq: Every day | SUBCUTANEOUS | 11 refills | Status: DC
Start: 2018-01-01 — End: 2018-11-17

## 2018-01-01 NOTE — Telephone Encounter (Signed)
Patient's daughter called to request a refill on lantus vials and insulin needles. Rx were sent to pharmacy electronically.

## 2018-01-16 ENCOUNTER — Ambulatory Visit (INDEPENDENT_AMBULATORY_CARE_PROVIDER_SITE_OTHER): Payer: Medicare HMO

## 2018-01-16 VITALS — BP 138/62 | HR 80 | Temp 98.6°F | Ht 65.0 in | Wt 256.0 lb

## 2018-01-16 DIAGNOSIS — Z794 Long term (current) use of insulin: Secondary | ICD-10-CM | POA: Diagnosis not present

## 2018-01-16 DIAGNOSIS — E1140 Type 2 diabetes mellitus with diabetic neuropathy, unspecified: Secondary | ICD-10-CM | POA: Diagnosis not present

## 2018-01-16 DIAGNOSIS — Z0000 Encounter for general adult medical examination without abnormal findings: Secondary | ICD-10-CM | POA: Diagnosis not present

## 2018-01-16 NOTE — Patient Instructions (Addendum)
[name] , Thank you for taking time to come for your Medicare Wellness Visit. I appreciate your ongoing commitment to your health goals. Please review the following plan we discussed and let me know if I can assist you in the future.   Screening recommendations/referrals: Colonoscopy due-cologuard information signed Mammogram due, ordered at last visit Bone Density due, ordered at last visit Recommended yearly ophthalmology/optometry visit for glaucoma screening and checkup Recommended yearly dental visit for hygiene and checkup  Vaccinations: Influenza vaccine up to date, due 2019 fall season Pneumococcal vaccine up to date, completed Tdap vaccine due, declined Shingles vaccine due, ordered to pharmacy. Please check insurance coverage before getting    Advanced directives: Please bring us a copy of your living will and health care power of attorney  Conditions/risks identified: none  Next appointment: Abbey ChattersJessica Eubanks, NP 01/22/2018 @ 10:15am             Tyron RussellSara Reyonna Haack, RN 01/18/2019 @ 9:15am   Preventive Care 65 Years and Older, Female Preventive care refers to lifestyle choices and visits with your health care provider that can promote health and wellness. What does preventive care include?  A yearly physical exam. This is also called an annual well check.  Dental exams once or twice a year.  Routine eye exams. Ask your health care provider how often you should have your eyes checked.  Personal lifestyle choices, including:  Daily care of your teeth and gums.  Regular physical activity.  Eating a healthy diet.  Avoiding tobacco and drug use.  Limiting alcohol use.  Practicing safe sex.  Taking low-dose aspirin every day.  Taking vitamin and mineral supplements as recommended by your health care provider. What happens during an annual well check? The services and screenings done by your health care provider during your annual well check will depend on your age,  overall health, lifestyle risk factors, and family history of disease. Counseling  Your health care provider may ask you questions about your:  Alcohol use.  Tobacco use.  Drug use.  Emotional well-being.  Home and relationship well-being.  Sexual activity.  Eating habits.  History of falls.  Memory and ability to understand (cognition).  Work and work Astronomerenvironment.  Reproductive health. Screening  You may have the following tests or measurements:  Height, weight, and BMI.  Blood pressure.  Lipid and cholesterol levels. These may be checked every 5 years, or more frequently if you are over 74 years old.  Skin check.  Lung cancer screening. You may have this screening every year starting at age 74 if you have a 30-pack-year history of smoking and currently smoke or have quit within the past 15 years.  Fecal occult blood test (FOBT) of the stool. You may have this test every year starting at age 74.  Flexible sigmoidoscopy or colonoscopy. You may have a sigmoidoscopy every 5 years or a colonoscopy every 10 years starting at age 74.  Hepatitis C blood test.  Hepatitis B blood test.  Sexually transmitted disease (STD) testing.  Diabetes screening. This is done by checking your blood sugar (glucose) after you have not eaten for a while (fasting). You may have this done every 1-3 years.  Bone density scan. This is done to screen for osteoporosis. You may have this done starting at age 765.  Mammogram. This may be done every 1-2 years. Talk to your health care provider about how often you should have regular mammograms. Talk with your health care provider about your test  results, treatment options, and if necessary, the need for more tests. Vaccines  Your health care provider may recommend certain vaccines, such as:  Influenza vaccine. This is recommended every year.  Tetanus, diphtheria, and acellular pertussis (Tdap, Td) vaccine. You may need a Td booster every 10  years.  Zoster vaccine. You may need this after age 53.  Pneumococcal 13-valent conjugate (PCV13) vaccine. One dose is recommended after age 15.  Pneumococcal polysaccharide (PPSV23) vaccine. One dose is recommended after age 82. Talk to your health care provider about which screenings and vaccines you need and how often you need them. This information is not intended to replace advice given to you by your health care provider. Make sure you discuss any questions you have with your health care provider. Document Released: 08/25/2015 Document Revised: 04/17/2016 Document Reviewed: 05/30/2015 Elsevier Interactive Patient Education  2017 [place] Prevention in the Home Falls can cause injuries. They can happen to people of all ages. There are many things you can do to make your home safe and to help prevent falls. What can I do on the outside of my home?  Regularly fix the edges of walkways and driveways and fix any cracks.  Remove anything that might make you trip as you walk through a door, such as a raised step or threshold.  Trim any bushes or trees on the path to your home.  Use bright outdoor lighting.  Clear any walking paths of anything that might make someone trip, such as rocks or tools.  Regularly check to see if handrails are loose or broken. Make sure that both sides of any steps have handrails.  Any raised decks and porches should have guardrails on the edges.  Have any leaves, snow, or ice cleared regularly.  Use sand or salt on walking paths during winter.  Clean up any spills in your garage right away. This includes oil or grease spills. What can I do in the bathroom?  Use night lights.  Install grab bars by the toilet and in the tub and shower. Do not use towel bars as grab bars.  Use non-skid mats or decals in the tub or shower.  If you need to sit down in the shower, use a plastic, non-slip stool.  Keep the floor dry. Clean up any water that  spills on the floor as soon as it happens.  Remove soap buildup in the tub or shower regularly.  Attach bath mats securely with double-sided non-slip rug tape.  Do not have throw rugs and other things on the floor that can make you trip. What can I do in the bedroom?  Use night lights.  Make sure that you have a light by your bed that is easy to reach.  Do not use any sheets or blankets that are too big for your bed. They should not hang down onto the floor.  Have a firm chair that has side arms. You can use this for support while you get dressed.  Do not have throw rugs and other things on the floor that can make you trip. What can I do in the kitchen?  Clean up any spills right away.  Avoid walking on wet floors.  Keep items that you use a lot in easy-to-reach places.  If you need to reach something above you, use a strong step stool that has a grab bar.  Keep electrical cords out of the way.  Do not use floor polish or wax that makes  floors slippery. If you must use wax, use non-skid floor wax.  Do not have throw rugs and other things on the floor that can make you trip. What can I do with my stairs?  Do not leave any items on the stairs.  Make sure that there are handrails on both sides of the stairs and use them. Fix handrails that are broken or loose. Make sure that handrails are as long as the stairways.  Check any carpeting to make sure that it is firmly attached to the stairs. Fix any carpet that is loose or worn.  Avoid having throw rugs at the top or bottom of the stairs. If you do have throw rugs, attach them to the floor with carpet tape.  Make sure that you have a light switch at the top of the stairs and the bottom of the stairs. If you do not have them, ask someone to add them for you. What else can I do to help prevent falls?  Wear shoes that:  Do not have high heels.  Have rubber bottoms.  Are comfortable and fit you well.  Are closed at the  toe. Do not wear sandals.  If you use a stepladder:  Make sure that it is fully opened. Do not climb a closed stepladder.  Make sure that both sides of the stepladder are locked into place.  Ask someone to hold it for you, if possible.  Clearly mark and make sure that you can see:  Any grab bars or handrails.  First and last steps.  Where the edge of each step is.  Use tools that help you move around (mobility aids) if they are needed. These include:  Canes.  Walkers.  Scooters.  Crutches.  Turn on the lights when you go into a dark area. Replace any light bulbs as soon as they burn out.  Set up your furniture so you have a clear path. Avoid moving your furniture around.  If any of your floors are uneven, fix them.  If there are any pets around you, be aware of where they are.  Review your medicines with your doctor. Some medicines can make you feel dizzy. This can increase your chance of falling. Ask your doctor what other things that you can do to help prevent falls. This information is not intended to replace advice given to you by your health care provider. Make sure you discuss any questions you have with your health care provider. Document Released: 05/25/2009 Document Revised: 01/04/2016 Document Reviewed: 09/02/2014 Elsevier Interactive Patient Education  2017 [employer].

## 2018-01-16 NOTE — Progress Notes (Signed)
Subjective:   [name] is a 72 y.o. female who presents for Medicare Annual (Subsequent) preventive examination.  Last AWV-05/14/2016       Objective:     Vitals: BP 138/62 (BP Location: Right Arm, Patient Position: Sitting)   Pulse 80   Temp 98.6 F (37 C) (Oral)   Ht 5\' 5"  (1.651 m)   Wt 256 lb (116.1 kg)   SpO2 94%   BMI 42.60 kg/m   Body mass index is 42.6 kg/m.  Advanced Directives 01/16/2018 02/15/2014 11/17/2011 11/17/2011  Does Patient Have a Medical Advance Directive? Yes Patient has advance directive, copy not in chart Patient does not have advance directive;Patient would not like information -  Type of Advance Directive Healthcare Power of [employer] Power of Attorney - -  Does patient want to make changes to medical advance directive? Yes (MAU/Ambulatory/Procedural Areas - Information given) No change requested - -  Copy of Healthcare Power of Attorney in Chart? No - copy requested Copy requested from family - -  Pre-existing out of facility DNR order (yellow form or pink MOST form) - No - No    Tobacco Social History   Tobacco Use  Smoking Status Former Smoker  . Packs/day: 0.25  . Years: 15.00  . Pack years: 3.75  . Types: Cigarettes  . Last attempt to quit: 08/12/1976  . Years since quitting: 41.4  Smokeless Tobacco Never Used     Counseling given: Not Answered   Clinical Intake:  Pre-visit preparation completed: No  Pain : No/denies pain     Nutritional Risks: None Diabetes: No  How often do you need to have someone help you when you read instructions, pamphlets, or other written materials from your doctor or pharmacy?: 1 - Never What is the last grade level you completed in school?: HS  Interpreter Needed?: Yes  Information entered by :: [name], RN  Past Medical History:  Diagnosis Date  . Anemia of chronic disease   . Arthritis   . Back pain, chronic   . Blind   . Candida infection of mouth   . Diabetes mellitus    . Esophageal reflux   . GERD (gastroesophageal reflux disease)   . Hallucination   . Hand pain, left   . Hyperlipidemia   . Hypertension   . Hypertensive renal disease   . Insomnia   . Morbid obesity (HCC)   . OAB (overactive bladder)   . Osteoarthritis    left shoulder  . Poor circulation   . PVD (peripheral vascular disease) (HCC)   . Retinal detachment 11/2012  . Senile dementia with delusional features (HCC)   . Senile purpura (HCC)   . Vitamin D deficiency    Past Surgical History:  Procedure Laterality Date  . CATARACT EXTRACTION, BILATERAL    . INCISION AND DRAINAGE PERIRECTAL ABSCESS  11/20/2011   Procedure: IRRIGATION AND DEBRIDEMENT PERIRECTAL ABSCESS;  Surgeon: Liz Malady, MD;  Location: [facility] OR;  Service: General;  Laterality: Right;  Incision and Drainage of Right Groin Abscess   Family History  Problem Relation Age of Onset  . Heart disease Mother   . Diabetes Mother   . Hyperlipidemia Son   . Hypertension Son    Social History   Socioeconomic History  . Marital status: Divorced    Spouse name: Not on file  . Number of children: Not on file  . Years of education: Not on file  . Highest education level: Not on file  Occupational History  . Not on file  Social Needs  . Financial resource strain: Not hard at all  . Food insecurity:    Worry: Never true    Inability: Never true  . Transportation needs:    Medical: No    Non-medical: No  Tobacco Use  . Smoking status: Former Smoker    Packs/day: 0.25    Years: 15.00    Pack years: 3.75    Types: Cigarettes    Last attempt to quit: 08/12/1976    Years since quitting: 41.4  . Smokeless tobacco: Never Used  Substance and Sexual Activity  . Alcohol use: No  . Drug use: No  . Sexual activity: Not Currently  Lifestyle  . Physical activity:    Days per week: 0 days    Minutes per session: 0 min  . Stress: Not at all  Relationships  . Social connections:    Talks on phone: More than three  times a week    Gets together: More than three times a week    Attends religious service: Never    Active member of club or organization: No    Attends meetings of clubs or organizations: Never    Relationship status: Divorced  Other Topics Concern  . Not on file  Social History Narrative   Social History      Diet? none      Do you drink/eat things with caffeine? Yes, coffee and tea      Marital status?                 divorced                   What year were you married?      Do you live in a house, apartment, assisted living, condo, trailer, etc.? house      Is it one or more stories? yes      How many persons live in your home? 4- lives with daughter in law and son and granddaughter      Do you have any pets in your home? (please list) 1 dog      Highest level of education completed? 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                 no                     Type & how often? n/a      Advanced Directives      Do you have a living will? no      Do you have a DNR form?                                  If not, do you want to discuss one? no      Do you have signed POA/HPOA for forms? yes      Functional Status      Do you have difficulty bathing or dressing yourself? yes      Do you have difficulty preparing food or eating? yes      Do you have difficulty managing your medications? yes      Do you have difficulty managing your finances? yes      Do you have  difficulty affording your medications? yes    Outpatient Encounter Medications as of 01/16/2018  Medication Sig  . amLODipine (NORVASC) 10 MG tablet Take 10 mg by mouth daily.   Marland Kitchen aspirin EC 81 MG tablet Take 81 mg by mouth daily.  Marland Kitchen atorvastatin (LIPITOR) 20 MG tablet Take 1 tablet (20 mg total) by mouth daily at 6 PM.  . CALCIUM PO Take 1,000 mg by mouth 2 (two) times daily.   . cilostazol (PLETAL) 100 MG tablet Take 50  mg by mouth every morning.   . docusate sodium (COLACE) 100 MG capsule Take 100 mg by mouth 2 (two) times daily. Stool softener  . esomeprazole (NEXIUM) 20 MG capsule Take 20 mg by mouth every morning.  . gabapentin (NEURONTIN) 300 MG capsule Take 300 mg by mouth at bedtime.  . insulin glargine (LANTUS) 100 UNIT/ML injection Inject 0.4 mLs (40 Units total) into the skin at bedtime. Dx: E11.40  . Insulin Syringe-Needle U-100 (INSULIN SYRINGE 1CC/31GX5/16") 31G X 5/16" 1 ML MISC 1 Syringe by Does not apply route at bedtime. Dx: E11.40  . IRON PO Take 1 tablet by mouth every evening.   . labetalol (NORMODYNE) 100 MG tablet Take 100 mg by mouth 2 (two) times daily.   . metFORMIN (GLUCOPHAGE) 1000 MG tablet Take 2,000 mg by mouth daily with breakfast.   . OLANZapine (ZYPREXA) 5 MG tablet Take 10 mg by mouth at bedtime.  Marland Kitchen olmesartan (BENICAR) 40 MG tablet Take 40 mg by mouth daily.  Marland Kitchen torsemide (DEMADEX) 20 MG tablet Take 10 mg by mouth every morning.    No facility-administered encounter medications on file as of 01/16/2018.     Activities of Daily Living In your present state of health, do you have any difficulty performing the following activities: 01/16/2018  Hearing? Y  Vision? Y  Difficulty concentrating or making decisions? Y  Walking or climbing stairs? Y  Dressing or bathing? Y  Doing errands, shopping? Y  Preparing Food and eating ? Y  Using the Toilet? Y  In the past six months, have you accidently leaked urine? Y  Do you have problems with loss of bowel control? Y  Managing your Medications? Y  Managing your Finances? Y  Housekeeping or managing your Housekeeping? Y  Some recent data might be hidden    Patient Care Team: Sharon Seller, NP as PCP - General (Geriatric Medicine) Van Clines, MD as Consulting Physician (Neurology)    Assessment:   This is a routine wellness examination for [place].  Exercise Activities and Dietary recommendations Current Exercise  Habits: The patient does not participate in regular exercise at present, Exercise limited by: orthopedic condition(s);Other - see comments(blind)  Goals    None      Fall Risk Fall Risk  01/16/2018 12/01/2017 10/16/2017 08/19/2017  Falls in the past year? Yes Yes Yes No  Comment - - Pt loses balance often.  -  Number falls in past yr: 2 or more 2 or more 2 or more -  Injury with Fall? No No - -  Follow up Falls evaluation completed - - -   Is the patient's home free of loose throw rugs in walkways, pet beds, electrical cords, etc?   yes      Grab bars in the bathroom? yes      Handrails on the stairs?   yes      Adequate lighting?   yes  Timed Get Up and Go performed: unable to complete. Patient  is unambulatory  Depression Screen PHQ 2/9 Scores 01/16/2018 10/16/2017  PHQ - 2 Score 1 2  PHQ- 9 Score - 13     Cognitive Function completed within last year MMSE - Mini Mental State Exam 12/01/2017  Orientation to time 4  Orientation to Place 3  Registration 3  Attention/ Calculation 5  Recall 3  Language- name 2 objects 2  Language- repeat 1  Language- follow 3 step command 3  Language- read & follow direction 0  Language-read & follow direction-comments Pt is blind  Write a sentence 0  Write a sentence-comments Pt is blind  Copy design 0  Copy design-comments Pt is blind  Total score 24        Immunization History  Administered Date(s) Administered  . Influenza-Unspecified 05/12/2014, 04/08/2017  . Pneumococcal Conjugate-13 12/19/2014  . Pneumococcal Polysaccharide-23 09/12/2001, 10/08/2012    Qualifies for Shingles Vaccine? Yes, educated and sent to pharmacy  Screening Tests Health Maintenance  Topic Date Due  . DEXA SCAN  12/06/2008  . MAMMOGRAM  10/09/2017  . TETANUS/TDAP  12/02/2018 (Originally 12/07/1964)  . Hepatitis C Screening  12/02/2018 (Originally Jan 16, 1946)  . COLONOSCOPY  01/17/2018  . INFLUENZA VACCINE  03/12/2018  . HEMOGLOBIN A1C  04/18/2018  . FOOT  EXAM  08/12/2018  . PNA vac Low Risk Adult  Completed  . OPHTHALMOLOGY EXAM  Discontinued    Cancer Screenings: Lung: Low Dose CT Chest recommended if Age 18-80 years, 30 pack-year currently smoking OR have quit w/in 15years. Patient does not qualify. Breast:  Up to date on Mammogram? No, ordered at last visit  Up to date of Bone Density/Dexa? No, ordered at last visit Colorectal: due, cologuard information  Additional Screenings:  Hepatitis C Screening: declined TDAP due-declined     Plan:    I have personally reviewed and addressed the Medicare Annual Wellness questionnaire and have noted the following in the patient's chart:  A. Medical and social history B. Use of alcohol, tobacco or illicit drugs  C. Current medications and supplements D. Functional ability and status E.  Nutritional status F.  Physical activity G. Advance directives H. List of other physicians I.  Hospitalizations, surgeries, and ER visits in previous 12 months J.  Vitals K. Screenings to include hearing, vision, cognitive, depression L. Referrals and appointments - none  In addition, I have reviewed and discussed with patient certain preventive protocols, quality metrics, and best practice recommendations. A written personalized care plan for preventive services as well as general preventive health recommendations were provided to patient.  See attached scanned questionnaire for additional information.   Signed,   [name], RN Nurse Health Advisor  Patient Concerns: Neuropathy in hands still really bothering her- daughter would like to discuss options other than PT

## 2018-01-17 LAB — HEMOGLOBIN A1C
EAG (MMOL/L): 11.6 (calc)
HEMOGLOBIN A1C: 8.9 %{Hb} — AB (ref <5.7)
MEAN PLASMA GLUCOSE: 209 (calc)

## 2018-01-17 LAB — BASIC METABOLIC PANEL WITH GFR
BUN/Creatinine Ratio: 16 (calc) (ref 6–22)
BUN: 23 mg/dL (ref 7–25)
CALCIUM: 10 mg/dL (ref 8.6–10.4)
CHLORIDE: 107 mmol/L (ref 98–110)
CO2: 30 mmol/L (ref 20–32)
Creat: 1.41 mg/dL — ABNORMAL HIGH (ref 0.60–0.93)
GFR, Est African American: 42 mL/min/{1.73_m2} — ABNORMAL LOW (ref >=60)
GFR, Est Non African American: 37 mL/min/{1.73_m2} — ABNORMAL LOW (ref >=60)
Glucose, Bld: 223 mg/dL — ABNORMAL HIGH (ref 65–99)
Potassium: 4.9 mmol/L (ref 3.5–5.3)
Sodium: 142 mmol/L (ref 135–146)

## 2018-01-19 ENCOUNTER — Other Ambulatory Visit: Payer: Medicare HMO

## 2018-01-19 ENCOUNTER — Ambulatory Visit: Payer: Medicare HMO | Admitting: Nurse Practitioner

## 2018-01-20 ENCOUNTER — Telehealth: Payer: Self-pay

## 2018-01-20 NOTE — Telephone Encounter (Signed)
-----   Message from Sharon SellerJessica K Eubanks, NP sent at 01/19/2018  8:56 AM EDT ----- Kidney function worse than 3 months ago. Due to worsening kidney function need to reduce metformin to 1 gm daily. Also diabetes is not controlled and A1c is worse at  8.9. We wild need to be adjusting diabetic medication at next office visit. Please make sure to bring blood sugar log with them to visit.

## 2018-01-20 NOTE — Telephone Encounter (Signed)
Discussed results with patient's daughter, [name] verbalized understanding of results. Copy of labs to be given at pending appointment on Thursday 01/22/18. Medication list updated to reflect decrease in metformin.

## 2018-01-22 ENCOUNTER — Encounter: Payer: Self-pay | Admitting: Nurse Practitioner

## 2018-01-22 ENCOUNTER — Encounter: Payer: Self-pay | Admitting: Neurology

## 2018-01-22 ENCOUNTER — Ambulatory Visit (INDEPENDENT_AMBULATORY_CARE_PROVIDER_SITE_OTHER): Payer: Medicare HMO | Admitting: Nurse Practitioner

## 2018-01-22 VITALS — BP 126/74 | HR 74 | Temp 99.0°F | Ht 65.0 in | Wt 254.0 lb

## 2018-01-22 DIAGNOSIS — E0822 Diabetes mellitus due to underlying condition with diabetic chronic kidney disease: Secondary | ICD-10-CM

## 2018-01-22 DIAGNOSIS — N183 Chronic kidney disease, stage 3 unspecified: Secondary | ICD-10-CM

## 2018-01-22 DIAGNOSIS — R443 Hallucinations, unspecified: Secondary | ICD-10-CM

## 2018-01-22 DIAGNOSIS — E1140 Type 2 diabetes mellitus with diabetic neuropathy, unspecified: Secondary | ICD-10-CM | POA: Diagnosis not present

## 2018-01-22 DIAGNOSIS — G629 Polyneuropathy, unspecified: Secondary | ICD-10-CM | POA: Diagnosis not present

## 2018-01-22 DIAGNOSIS — Z794 Long term (current) use of insulin: Secondary | ICD-10-CM

## 2018-01-22 MED ORDER — PREGABALIN 50 MG PO CAPS
50.0000 mg | ORAL_CAPSULE | Freq: Two times a day (BID) | ORAL | 1 refills | Status: DC
Start: 2018-01-22 — End: 2018-04-27

## 2018-01-22 MED ORDER — GLUCOSE BLOOD VI STRP: 1.0000 | ORAL_STRIP | Freq: Two times a day (BID) | 11 refills | Status: DC

## 2018-01-22 MED ORDER — TORSEMIDE 20 MG PO TABS: 10.0000 mg | ORAL_TABLET | Freq: Every morning | ORAL | 1 refills | Status: DC

## 2018-01-22 MED ORDER — CILOSTAZOL 100 MG PO TABS: 50.0000 mg | ORAL_TABLET | Freq: Every morning | ORAL | 1 refills | Status: DC

## 2018-01-22 MED ORDER — LABETALOL HCL 100 MG PO TABS: 100.0000 mg | ORAL_TABLET | Freq: Two times a day (BID) | ORAL | 1 refills | Status: DC

## 2018-01-22 NOTE — Patient Instructions (Signed)
STOP gabapentin Start lyrica 50 mg by mouth twice daily for hand pain/neuropathy Nerve studies ordered they will be in touch with you to schedule  To follow up in 4-6 weeks with Dr Montez Moritaarter on blood sugars and hand pain.

## 2018-01-22 NOTE — Progress Notes (Signed)
Careteam: Patient Care Team: Sharon Seller, NP as PCP - General (Geriatric Medicine) Van Clines, MD as Consulting Physician (Neurology)  Advanced Directive information Does Patient Have a Medical Advance Directive?: No  Allergies  Allergen Reactions  . Oxycodone Other (See Comments)    "I'm swimming; I'm floating; I'm diving; I'm in another world."    Chief Complaint  Patient presents with  . Follow-up    Pt is being seen for a 6 week follow up on blood sugars. Pt forgot glucose log but daughter states that sugars are running 98 to 130.   . Medication Refill    multiple rx pended  . ACP    needed  . Other    Daughter in room      HPI: Patient is a 71 y.o. female seen in the office today for follow up on blood sugar.  A1c 8.9 on 01/16/18 Checking blood sugars twice daily but did not bring meter.  Checking lantus 40 units at bedtime also taking metformin 1000 mg by mouth daily (had to adjust due to renal function)  No low blood sugars Daughter reports this morning blood sugar was 98.  Daughter states that they have not been checking her blood sugar as they were supposed to. States her blood sugars were in the 98-130s. Has limited her sugar intake and modified her diet in the last month.  Currently checking blood sugars before she eats in the morning and then around 10-11 at night (eats dinner usually between 5-7)   Continues to complain of neuropathy in left hand. Has been going on a year or 2. Not aware or any work up on this.  states gabapentin does not help. Has used tylenol and rubs in the past without much relief.  No sensation to grip or hold things in her hand. Use of hand has gotten worse. No neck pain.   Has not been taking her medication for hallucinations (zyprexa). originally pharmacy did not have supply so she ran out then decided to stop giving it to her.  Has not had medication in 2 months and only had 1 episode. Prior episodes were happening every  week. Episodes would last days. She would be in and out of it.  Now just having hallucinations but not as extreme as what they were and do not effect ADLS and getting her to appts.  Pt talks to herself during appt.     Review of Systems:  Review of Systems  Unable to perform ROS: Psychiatric disorder    Past Medical History:  Diagnosis Date  . Anemia of chronic disease   . Arthritis   . Back pain, chronic   . Blind   . Candida infection of mouth   . Diabetes mellitus   . Esophageal reflux   . GERD (gastroesophageal reflux disease)   . Hallucination   . Hand pain, left   . Hyperlipidemia   . Hypertension   . Hypertensive renal disease   . Insomnia   . Morbid obesity (HCC)   . OAB (overactive bladder)   . Osteoarthritis    left shoulder  . Poor circulation   . PVD (peripheral vascular disease) (HCC)   . Retinal detachment 11/2012  . Senile dementia with delusional features (HCC)   . Senile purpura (HCC)   . Vitamin D deficiency    Past Surgical History:  Procedure Laterality Date  . CATARACT EXTRACTION, BILATERAL    . INCISION AND DRAINAGE PERIRECTAL ABSCESS  11/20/2011  Procedure: IRRIGATION AND DEBRIDEMENT PERIRECTAL ABSCESS;  Surgeon: Liz MaladyBurke E Thompson, MD;  [facility];  Service: General;  Laterality: Right;  Incision and Drainage of Right Groin Abscess   Social History:   reports that she quit smoking about 41 years ago. Her smoking use included cigarettes. She has a 3.75 pack-year smoking history. She has never used smokeless tobacco. She reports that she does not drink alcohol or use drugs.  Family History  Problem Relation Age of Onset  . Heart disease Mother   . Diabetes Mother   . Hyperlipidemia Son   . Hypertension Son     Medications: Patient's Medications  New Prescriptions   No medications on file  Previous Medications   AMLODIPINE (NORVASC) 10 MG TABLET    Take 10 mg by mouth daily.    ASPIRIN EC 81 MG TABLET    Take 81 mg by mouth daily.    ATORVASTATIN (LIPITOR) 20 MG TABLET    Take 1 tablet (20 mg total) by mouth daily at 6 PM.   CALCIUM PO    Take 1,000 mg by mouth 2 (two) times daily.    CILOSTAZOL (PLETAL) 100 MG TABLET    Take 50 mg by mouth every morning.    DOCUSATE SODIUM (COLACE) 100 MG CAPSULE    Take 100 mg by mouth 2 (two) times daily. Stool softener   ESOMEPRAZOLE (NEXIUM) 20 MG CAPSULE    Take 20 mg by mouth every morning.   GABAPENTIN (NEURONTIN) 300 MG CAPSULE    Take 300 mg by mouth at bedtime.   GLUCOSE BLOOD (ONETOUCH VERIO) TEST STRIP    1 each by Other route 2 (two) times daily. Dx: E11.40   INSULIN GLARGINE (LANTUS) 100 UNIT/ML INJECTION    Inject 0.4 mLs (40 Units total) into the skin at bedtime. Dx: E11.40   INSULIN SYRINGE-NEEDLE U-100 (INSULIN SYRINGE 1CC/31GX5/16") 31G X 5/16" 1 ML MISC    1 Syringe by Does not apply route at bedtime. Dx: E11.40   IRON PO    Take 1 tablet by mouth every evening.    LABETALOL (NORMODYNE) 100 MG TABLET    Take 100 mg by mouth 2 (two) times daily.    METFORMIN (GLUCOPHAGE) 1000 MG TABLET    Take 1,000 mg by mouth daily with breakfast.   OLANZAPINE (ZYPREXA) 5 MG TABLET    Take 10 mg by mouth at bedtime.   OLMESARTAN (BENICAR) 40 MG TABLET    Take 40 mg by mouth daily.   TORSEMIDE (DEMADEX) 20 MG TABLET    Take 10 mg by mouth every morning.   Modified Medications   No medications on file  Discontinued Medications   No medications on file     Physical Exam:  Vitals:   01/22/18 1020  BP: 126/74  Pulse: 74  Temp: 99 F (37.2 C)  TempSrc: Oral  SpO2: 96%  Weight: 254 lb (115.2 kg)  Height: 5\' 5"  (1.651 m)   Body mass index is 42.27 kg/m.  Physical Exam  Constitutional: She is oriented to person, place, and time. She appears well-developed and well-nourished. No distress.  Obese female   HENT:  Head: Normocephalic and atraumatic.  Mouth/Throat: Oropharynx is clear and moist. No oropharyngeal exudate.  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 Neck supple.  Cardiovascular: Normal rate, regular rhythm and normal heart sounds.  Pulmonary/Chest: Effort normal and breath sounds normal.  Abdominal: Soft. Bowel sounds are normal.  Musculoskeletal: She  exhibits tenderness (to bilateral wrist). She exhibits no edema.  Neurological: She is alert and oriented to person, place, and time.  sensation to hands intact and equal bilateral, decreased strength to left hand  Skin: Skin is warm and dry. She is not diaphoretic.  Psychiatric: She has a normal mood and affect.  Talks to self during visit.    Labs reviewed: Basic Metabolic Panel: Recent Labs    08/28/17 10/16/17 1007 01/16/18 0942  NA 142 144 142  K 5.4* 4.1 4.9  CL  --  108 107  CO2  --  30 30  GLUCOSE  --  123* 223*  BUN 23* 19 23  CREATININE 1.3* 1.25* 1.41*  CALCIUM  --  10.0 10.0  TSH  --  2.00  --    Liver Function Tests: Recent Labs    05/27/17 08/28/17 10/16/17 1007  AST 11* 10* 12  ALT 9 9 11   ALKPHOS 53 66  --   BILITOT  --   --  0.4  PROT  --   --  6.0*   No results for input(s): LIPASE, AMYLASE in the last 8760 hours. No results for input(s): AMMONIA in the last 8760 hours. CBC: Recent Labs    05/27/17 08/28/17 10/16/17 1007  WBC 4.1 4.4 4.1  NEUTROABS  --   --  1,935  HGB 10.0* 9.5* 10.1*  HCT 30* 32* 30.7*  MCV  --   --  89.8  PLT 175 191 212   Lipid Panel: Recent Labs    05/27/17 08/28/17 10/16/17 1007  CHOL 113 128 121  HDL 54 65 59  LDLCALC 47 54 46  TRIG 62 46 78  CHOLHDL  --   --  2.1   TSH: Recent Labs    10/16/17 1007  TSH 2.00   A1C: Lab Results  Component Value Date   HGBA1C 8.9 (H) 01/16/2018     Assessment/Plan 1. CKD (chronic kidney disease) stage 3, GFR 30-59 ml/min (HCC) Worsening renal function most likely due to poor diabetic control. Discussed this with daughter. Encourage proper hydration and to avoid NSAIDS (Aleve, Advil, Motrin, Ibuprofen)   2. Type 2 diabetes mellitus with diabetic  neuropathy, with long-term current use of insulin (HCC) -poorly controlled, daughter has now modified diet and blood sugars seem to be improving.  Continue to take blood sugars twice daily and notify if fasting becomes over 150 or over 200 after meal.  Will continue current regimen because by report seem to be improving. To bring blood sugar log to next visit.  -continues on lantus and metformin - pregabalin (LYRICA) 50 MG capsule; Take 1 capsule (50 mg total) by mouth 2 (two) times daily.  Dispense: 60 capsule; Refill: 1 - NCV with EMG(electromyography); Future  3. Diabetes mellitus due to underlying condition with stage 3 chronic kidney disease, with long-term current use of insulin (HCC) -see number 2 - glucose blood (ONETOUCH VERIO) test strip; 1 each by Other route 2 (two) times daily. Dx: E11.40  Dispense: 100 each; Refill: 11  4. Hallucination Intensity of hallucinations have improved off zyprexa and family no longer giving her this medication. Encouraged geropsychiatry follow up at this time.   5. Neuropathy Increase pain to left hand, reports numbness with decrease strength. To stop gabapentin as not felt like this is effective.  - pregabalin (LYRICA) 50 MG capsule; Take 1 capsule (50 mg total) by mouth 2 (two) times daily.  Dispense: 60 capsule; Refill: 1 - NCV with EMG(electromyography); Future  Next appt: 03/18/2018 Janene Harvey. Biagio Borg  [facility] [phone]

## 2018-01-28 ENCOUNTER — Other Ambulatory Visit: Payer: Self-pay | Admitting: *Deleted

## 2018-01-28 MED ORDER — GLUCOSE BLOOD VI STRP: ORAL_STRIP | 3 refills | Status: DC

## 2018-01-28 MED ORDER — ACCU-CHEK AVIVA PLUS W/DEVICE KIT: PACK | 0 refills | Status: DC

## 2018-01-28 MED ORDER — ACCU-CHEK SOFTCLIX LANCETS MISC: 3 refills | Status: DC

## 2018-01-28 NOTE — Telephone Encounter (Signed)
Patient daughter called and stated that patient's blood sugar test strips are no longer covered by insurance.  I called pharmacy and spoke with pharmacist and he stated that the AccuChek is covered. Phoned to Pharmacist for Meter, Test Strips and Lancets.

## 2018-02-03 ENCOUNTER — Ambulatory Visit (INDEPENDENT_AMBULATORY_CARE_PROVIDER_SITE_OTHER): Payer: Medicare HMO | Admitting: Neurology

## 2018-02-03 DIAGNOSIS — E1140 Type 2 diabetes mellitus with diabetic neuropathy, unspecified: Secondary | ICD-10-CM

## 2018-02-03 DIAGNOSIS — Z794 Long term (current) use of insulin: Secondary | ICD-10-CM | POA: Diagnosis not present

## 2018-02-03 DIAGNOSIS — G629 Polyneuropathy, unspecified: Secondary | ICD-10-CM

## 2018-02-03 NOTE — Procedures (Signed)
[facility]  [address], [address]  [place], [place] [address] Tel: [phone] Fax:  [phone] Test Date:  02/03/2018  Patient: [name] DOB: 09-04-1945 Physician: Nita Sickleonika Anahi [place]mar, DO  Sex: Female Height: 5\' 5"  Ref Phys: Abbey ChattersJessica Eubanks, NP  ID#: [id number] Temp: 34.3C Technician:    Patient Complaints: This is a 72 year old female with diabetic neuropathy referred for evaluation of left hand pain and paresthesia.  NCV & EMG Findings: Extensive electrodiagnostic testing of the left upper extremity shows:  1. Left median and ulnar sensory response is absent. Left radial sensory response shows reduced amplitude (6.7 V).   2. Left median motor response is absent. Left ulnar motor response shows reduced amplitude (4.0 mV) and decreased conduction velocity across the elbow (A Elbow-B Elbow, 40 m/s).   3. Despite maximal activation, no motor unit recruitment is seen in the left abductor pollicis brevis muscle. Chronic motor axon loss changes are seen predominantly in ulnar innervated muscles, and to a lesser degree in the extensor indicis proprius. Active denervation is isolated to the first dorsal interosseous muscle.  Impression: 1. 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 Overall, these findings are severe in degree electrically. 2. A superimposed severe carpal tunnel syndrome and ulnar neuropathy at the elbow cannot be excluded. Correlate clinically.   ___________________________ Nita Sickleonika Vantasia Pinkney, DO    Nerve Conduction Studies Anti Sensory Summary Table   Site NR Peak (ms) Norm Peak (ms) P-T Amp (V) Norm P-T Amp  Left Median Anti Sensory (2nd Digit)  Wrist NR  <3.8  >10  Left Radial Anti Sensory (Base 1st Digit)  Wrist    2.8 <2.8 6.7 >10  Left Ulnar Anti Sensory (5th Digit)  Wrist NR  <3.2  >5   Motor Summary Table   Site NR Onset  (ms) Norm Onset (ms) O-P Amp (mV) Norm O-P Amp Site1 Site2 Delta-0 (ms) Dist (cm) Vel (m/s) Norm Vel (m/s)  Left Median Motor (Abd Poll Brev)  Wrist NR  <4.0  >5 Elbow Wrist  0.0  >50  Elbow NR            Left Ulnar Motor (Abd Dig Minimi)    Technically challenged  Wrist    2.6 <3.1 4.0 >7 B Elbow Wrist 4.1 26.0 63 >50  B Elbow    6.7  2.1  A Elbow B Elbow 2.5 10.0 40 >50  A Elbow    9.2  1.9          EMG   Side Muscle Ins Act Fibs Psw Fasc Number Recrt Dur Dur. Amp Amp. Poly Poly. Comment  Left 1stDorInt Nml 1+ Nml Nml 3- Rapid Some 1+ Some 1+ Nml Nml ATR  Left Abd Poll Brev Nml Nml Nml Nml NE - - - - - - - ATR  Left Ext Indicis Nml Nml Nml Nml 1- Rapid Few 1+ Few 1+ Nml Nml N/A  Left PronatorTeres Nml Nml Nml Nml Nml Nml Nml Nml Nml Nml Nml Nml N/A  Left Biceps Nml Nml Nml Nml Nml Nml Nml Nml Nml Nml Nml Nml N/A  Left Triceps Nml Nml Nml Nml Nml Nml Nml Nml Nml Nml Nml Nml N/A  Left Deltoid Nml Nml Nml Nml Nml Nml Nml Nml Nml Nml Nml Nml N/A  Left ABD Dig Min Nml Nml Nml Nml 3- Rapid Many 1+ Many 1+ Nml Nml N/A  Left FlexCarpiUln Nml Nml  Nml Nml 2- Rapid Many 1+ Many 1+ Some 1+ N/A      Waveforms:

## 2018-02-12 DIAGNOSIS — Z1212 Encounter for screening for malignant neoplasm of rectum: Secondary | ICD-10-CM | POA: Diagnosis not present

## 2018-02-12 DIAGNOSIS — Z1211 Encounter for screening for malignant neoplasm of colon: Secondary | ICD-10-CM | POA: Diagnosis not present

## 2018-02-12 LAB — COLOGUARD: COLOGUARD: NEGATIVE

## 2018-02-16 ENCOUNTER — Ambulatory Visit: Payer: Medicare HMO | Admitting: Neurology

## 2018-02-19 ENCOUNTER — Encounter: Payer: Self-pay | Admitting: *Deleted

## 2018-03-16 ENCOUNTER — Other Ambulatory Visit: Payer: Self-pay | Admitting: *Deleted

## 2018-03-16 MED ORDER — OLMESARTAN MEDOXOMIL 40 MG PO TABS: 40.0000 mg | ORAL_TABLET | Freq: Every day | ORAL | 3 refills | Status: DC

## 2018-03-16 MED ORDER — METFORMIN HCL 1000 MG PO TABS: 1000.0000 mg | ORAL_TABLET | Freq: Every day | ORAL | 3 refills | Status: DC

## 2018-03-16 NOTE — Telephone Encounter (Signed)
Patient daughter requested refills. Had to reschedule Wednesday's appointment to next week.

## 2018-03-18 ENCOUNTER — Ambulatory Visit: Payer: Medicare HMO | Admitting: Internal Medicine

## 2018-03-23 ENCOUNTER — Encounter: Payer: Self-pay | Admitting: Neurology

## 2018-03-23 ENCOUNTER — Ambulatory Visit: Payer: Medicare HMO | Admitting: Neurology

## 2018-03-23 VITALS — BP 112/60 | HR 66 | Ht 66.0 in | Wt 267.0 lb

## 2018-03-23 DIAGNOSIS — Z794 Long term (current) use of insulin: Secondary | ICD-10-CM

## 2018-03-23 DIAGNOSIS — E1140 Type 2 diabetes mellitus with diabetic neuropathy, unspecified: Secondary | ICD-10-CM

## 2018-03-23 DIAGNOSIS — G5602 Carpal tunnel syndrome, left upper limb: Secondary | ICD-10-CM | POA: Diagnosis not present

## 2018-03-23 DIAGNOSIS — R440 Auditory hallucinations: Secondary | ICD-10-CM | POA: Diagnosis not present

## 2018-03-23 DIAGNOSIS — R441 Visual hallucinations: Secondary | ICD-10-CM | POA: Diagnosis not present

## 2018-03-23 NOTE — Patient Instructions (Signed)
Continue with current medications. Let us know how the hallucinations are with her current medications, we can adjust as needed. Follow-up in 6 months or so, call for any changes.

## 2018-03-23 NOTE — Progress Notes (Signed)
NEUROLOGY FOLLOW UP OFFICE NOTE  SOPHI CALLIGAN [id number] [birth year]  HISTORY OF PRESENT ILLNESS: I had the pleasure of seeing Libbi Towner in follow-up in the neurology clinic on 03/23/2018.  The patient was last seen 7 months ago for visual and auditory hallucinations and is again accompanied by her son who helps supplement the history today.  Records and images were personally reviewed where available.  On her last visit, she was referred to Psychiatry but it appears they did not proceed. Her son thought she is still taking the Zyprexa, however I reviewed PCP notes which indicate that his wife reported she had not been taking Zyprexa initially because of pharmacy issues, then she ran out and decided to stop giving it to her. She had not been given the medication in a few months and was not having the hallucinations as bad. Her son reports that she went 3-4 months without hallucinations, and now they are not as bad. When the patient is asked about the hallucinations, she states they are occurring every day. She sees a family gathering, or "men are after me." Her son will have his wife call our office to provide additional information on medication as he is unaware of it. She was also reporting left hand symptoms to her PCP and had an EMG/NCV which showed severe sensorimotor polyneuropathy, a superimposed severe carpal tunnel syndrome and ulnar neuropathy at the elbow could not be excluded. She reports numbness in both hands.   History on Initial Assessment 08/19/2017: This is a pleasant 72 year old right-handed woman with a history of diabetes, hypertension, legally blind in both eyes, presenting to establish care. She was previously followed by neurologist Dr. Verdene Rio, records were reviewed. Her last visit was in January 2018, diagnosis of hallucinations of undetermined cause, cannot exclude the possibility of Lewy Body disease but less likely due to lack of parkinsonian features. She underwent  Neuropsychological testing in April 2018, which interestingly 11/2016 notes that her performance was NOT consistent with a cognitive disorder. Her MoCA (Blind) was 17/22. There was little concern for a neurodegenerative process including LBD. From an emotional standpoint, there appears to be a moderate degree of both depression and anxiety.   Her daughter reports that the visual hallucinations started not too long after her vision decline in 2005. They were not as severe, but over time she has become scared of them, seeing people after her trying to kill her. As her son speaks today, she interjects that "this man is talking clear." When asked bout the hallucinations, she describes seeing chairs, tables, people sitting, walls, white and blue colors. She states she does not see faces, but her daughter reports that a lot of people in her hallucinations are family members or friends. She says there is an old man telling them her story, that they were friends but not anymore. It appears that in the beginning, hallucinations were more visual, but now she has prolonged conversations with them that can last for up to 4 days where she would not sleep the entire day. The patients states they talk to her but she can't understand them, like they are speaking in whispers/wheezes. Her family describes hallucinations of guns, shooting outside, thinking someone is beating up her son and drugging him or running him over with vehicles. She talks a lot about a little girl called [place] with "a lot of sexual stuff going on." Her family reports that they are so real to her, that even when she is not  actively hallucinating, she talks about events like they were real. She would be loud screaming and cursing all day and all night. She would refuse to eat, and if family made her eat, she would say she is in court. Her family reports that it seems she has periods where she is lucid and fine, initially she would be fine every couple of  weeks, but now she would go into these prolonged hallucinations every week. She had been fine Christmas time, and only started current episode of hallucinations last night and this morning.   She feels her memory is very good. She moved in with her family in 2007 and completely lost vision in 2008. She has been prescribed Seroquel for the hallucinations, currently on 14m in AM, 2045min PM with no side effects. She had briefly tried Nuplazid initially, with no response. She is mostly in bed all day and does not move around much. She needs 1-2 person assist to get around. She can feed herself, but complains so much that her hand is hurting that she can't hold things anymore. She has a history of right arm injury after a fall prior to 2005 where her arm did not heal well. She complains a lot about a knot in her stomach for the past couple of months.   Diagnostic Data: [facility], maximum in the temporal lobes, mild small vessel occlusive disease. The ventricles are slightly enlarged without true hydrocephalus. Images unavailable for review.  PAST MEDICAL HISTORY: Past Medical History:  Diagnosis Date  . Anemia of chronic disease   . Arthritis   . Back pain, chronic   . Blind   . Candida infection of mouth   . Diabetes mellitus   . Esophageal reflux   . GERD (gastroesophageal reflux disease)   . Hallucination   . Hand pain, left   . Hyperlipidemia   . Hypertension   . Hypertensive renal disease   . Insomnia   . Morbid [place] ([facility]  . OAB (overactive bladder)   . Osteoarthritis    left shoulder  . Poor circulation   . P[place] disease) ([facility]  . Retinal detachment 11/2012  . Senile dementia with delusional features ([facility]  . Senile purpura ([facility]  . Vitamin D deficiency     MEDICATIONS: Current Outpatient Medications on File Prior to Visit  Medication Sig Dispense Refill  . ACCU-CHEK SOFTCLIX LANCETS  lancets Use to test blood sugar three times daily. Dx: E11.40 100 each 3  . amLODipine (NORVASC) 10 MG tablet Take 10 mg by mouth daily.     . Marland Kitchenspirin EC 81 MG tablet Take 81 mg by mouth daily.    . Marland Kitchentorvastatin (LIPITOR) 20 MG tablet Take 1 tablet (20 mg total) by mouth daily at 6 PM. 30 tablet 5  . Blood Glucose Monitoring Suppl (ACCU-CHEK AVIVA PLUS) w/Device KIT Use to test Blood sugar three times daily. Dx: E11.40 1 kit 0  . CALCIUM PO Take 1,000 mg by mouth 2 (two) times daily.     . cilostazol (PLETAL) 100 MG tablet Take 0.5 tablets (50 mg total) by mouth every morning. 90 tablet 1  . docusate sodium (COLACE) 100 MG capsule Take 100 mg by mouth 2 (two) times daily. Stool softener    . esomeprazole (NEXIUM) 20 MG capsule Take 20 mg by mouth every morning.    . Marland Kitchenlucose blood test strip Use to test blood sugar  three times daily. Dx: E11.40 100 each 3  . insulin glargine (LANTUS) 100 UNIT/ML injection Inject 0.4 mLs (40 Units total) into the skin at bedtime. Dx: E11.40 10 mL 11  . Insulin Syringe-Needle U-100 (INSULIN SYRINGE 1CC/31GX5/16") 31G X 5/16" 1 ML MISC 1 Syringe by Does not apply route at bedtime. Dx: E11.40 100 each 11  . IRON PO Take 1 tablet by mouth every evening.     . labetalol (NORMODYNE) 100 MG tablet Take 1 tablet (100 mg total) by mouth 2 (two) times daily. 180 tablet 1  . metFORMIN (GLUCOPHAGE) 1000 MG tablet Take 1 tablet (1,000 mg total) by mouth daily with breakfast. 30 tablet 3  . olmesartan (BENICAR) 40 MG tablet Take 1 tablet (40 mg total) by mouth daily. 30 tablet 3  . pregabalin (LYRICA) 50 MG capsule Take 1 capsule (50 mg total) by mouth 2 (two) times daily. 60 capsule 1  . QUEtiapine (SEROQUEL) 50 MG tablet TAKE 1 TABLET BY MOUTH EVERY MORNING AND 2 TABLETS EVERY NIGHT AT BEDTIME    . saxagliptin HCl (ONGLYZA) 5 MG TABS tablet Take by mouth.    . torsemide (DEMADEX) 20 MG tablet Take 0.5 tablets (10 mg total) by mouth every morning. 45 tablet 1  . valsartan  (DIOVAN) 320 MG tablet TK 1 T PO QD     No current facility-administered medications on file prior to visit.     ALLERGIES: Allergies  Allergen Reactions  . Oxycodone Other (See Comments)    "I'm swimming; I'm floating; I'm diving; I'm in another world."    FAMILY HISTORY: Family History  Problem Relation Age of Onset  . Heart disease Mother   . Diabetes Mother   . Hyperlipidemia Son   . Hypertension Son     SOCIAL HISTORY: Social History   Socioeconomic History  . Marital status: Divorced    Spouse name: Not on file  . Number of children: Not on file  . Years of education: Not on file  . Highest education level: Not on file  Occupational History  . Not on file  Social Needs  . Financial resource strain: Not hard at all  . Food insecurity:    Worry: Never true    Inability: Never true  . Transportation needs:    Medical: No    Non-medical: No  Tobacco Use  . Smoking status: Former Smoker    Packs/day: 0.25    Years: 15.00    Pack years: 3.75    Types: Cigarettes    Last attempt to quit: 08/12/1976    Years since quitting: 41.6  . Smokeless tobacco: Never Used  Substance and Sexual Activity  . Alcohol use: No  . Drug use: No  . Sexual activity: Not Currently  Lifestyle  . Physical activity:    Days per week: 0 days    Minutes per session: 0 min  . Stress: Not at all  Relationships  . Social connections:    Talks on phone: More than three times a week    Gets together: More than three times a week    Attends religious service: Never    Active member of club or organization: No    Attends meetings of clubs or organizations: Never    Relationship status: Divorced  . Intimate partner violence:    Fear of current or ex partner: No    Emotionally abused: No    Physically abused: No    Forced sexual activity: No  Other Topics  Concern  . Not on file  Social History Narrative   Social History      Diet? none      Do you drink/eat things with caffeine?  Yes, coffee and tea      Marital status?                 divorced                   What year were you married?      Do you live in a house, apartment, assisted living, condo, trailer, etc.? house      Is it one or more stories? yes      How many persons live in your home? 4- lives with daughter in law and son and granddaughter      Do you have any pets in your home? (please list) 1 dog      Highest level of education completed? 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                 no                     Type & how often? n/a      Advanced Directives      Do you have a living will? no      Do you have a DNR form?                                  If not, do you want to discuss one? no      Do you have signed POA/HPOA for forms? yes      Functional Status      Do you have difficulty bathing or dressing yourself? yes      Do you have difficulty preparing food or eating? yes      Do you have difficulty managing your medications? yes      Do you have difficulty managing your finances? yes      Do you have difficulty affording your medications? yes    REVIEW OF SYSTEMS: Constitutional: No fevers, chills, or sweats, no generalized fatigue, change in appetite Eyes: No visual changes, double vision, eye pain Ear, nose and throat: No hearing loss, ear pain, nasal congestion, sore throat Cardiovascular: No chest pain, palpitations Respiratory:  No shortness of breath at rest or with exertion, wheezes GastrointestinaI: No nausea, vomiting, diarrhea, abdominal pain, fecal incontinence Genitourinary:  No dysuria, urinary retention or frequency Musculoskeletal:  No neck pain, back pain Integumentary: No rash, pruritus, skin lesions Neurological: as above Psychiatric: No depression, insomnia, anxiety Endocrine: No palpitations, fatigue, diaphoresis, mood swings, change in appetite, change in weight,  increased thirst Hematologic/Lymphatic:  No anemia, purpura, petechiae. Allergic/Immunologic: no itchy/runny eyes, nasal congestion, recent allergic reactions, rashes  PHYSICAL EXAM: Vitals:   03/23/18 1534  BP: 112/60  Pulse: 66  SpO2: 98%   General: No acute distress, sitting comfortably on wheelchair Head:  Normocephalic/atraumatic Eyes: blind in both eyes, pupils large, irregular, unreactive to light Neck: supple, no paraspinal tenderness, full range of motion Back: No paraspinal tenderness Heart: regular rate and rhythm Lungs: Clear to auscultation bilaterally. Vascular: No carotid bruits. Skin/Extremities: No rash, no edema Neurological Exam: Mental status: alert and oriented to person, place,  and time, no dysarthria or aphasia, Fund of knowledge is appropriate.  Recent and remote memory are intact. Attention and concentration are normal.    Able to repeat phrases.   Cranial nerves: CN I: not tested CN II: pupils irregular, unreactive to light, no light perception CN III, IV, VI:  full range of motion, no nystagmus, no ptosis CN V: facial sensation intact CN VII: upper and lower face symmetric CN VIII: hearing intact to conversation CN XII: tongue midline Bulk & Tone: normal, no cogwheeling, no fasciculations. Motor: 5/5 throughout except for 4-/5 left APB weakness, no pronator drift. Sensation: intact to light touch Deep Tendon Reflexes: +2 throughout, no ankle clonus Plantar responses: downgoing bilaterally Cerebellar: no incoordination on finger to nose testing Gait: not tested Tremor: none in office today +Tinel sign at the left wrist  IMPRESSION: This is a pleasant 72 yo RH woman with a history of hypertension, diabetes, legally blind in both eyes, with visual and auditory hallucinations. She had presented to her prior neurologist with significant visual hallucinations, consideration was for Lewy Body dementia, however she had Neuropsychological testing done in  April 2018 which was not consistent with a cognitive disorder. Her MoCA (Blind) was 17/22. There was little concern for a neurodegenerative process including LBD. From an emotional standpoint, there appears to be a moderate degree of both depression and anxiety. MRI brain in 2017 did not show any acute changes, there was atrophy, more in the temporal lobes. Etiology of hallucinations remains unclear, there is a possibility this is more of a primary psychiatric condition rather than a neurodegenerative disorder. The bad hallucinations seem to have quieted down per son, but she reports daily hallucinations. It is unclear what antipsychotic she is currently taking, her daughter-in-law will be calling our office to provide additional information. She reports left hand weakness and numbness, EMG/NCV showed severe neuropathy (likely due to diabetes), as well as possible severe carpal tunnel or ulnar neuropathy, exam today shows positive Tinel sign at the wrist consistent with carpal tunnel syndrome. We discussed seeing a hand surgeon to do carpal tunnel release surgery, however with significant neuropathy, there may not be significant improvement in symptoms. They decided to hold off on surgery referral. We had discussed repeating Neuropsych testing, this will be scheduled after discussion with her daughter-in-law. She will follow-up in 6 months and knows to call for any changes.   Thank you for allowing me to participate in her care.  Please do not hesitate to call for any questions or concerns.  The duration of this appointment visit was 30 minutes of face-to-face time with the patient.  Greater than 50% of this time was spent in counseling, explanation of diagnosis, planning of further management, and coordination of care.   Ellouise Newer, M.D.   CC: Sherrie Mustache, NP

## 2018-03-24 ENCOUNTER — Encounter: Payer: Self-pay | Admitting: Internal Medicine

## 2018-03-24 ENCOUNTER — Ambulatory Visit (INDEPENDENT_AMBULATORY_CARE_PROVIDER_SITE_OTHER): Payer: Medicare HMO | Admitting: Internal Medicine

## 2018-03-24 VITALS — BP 118/60 | HR 74 | Temp 98.6°F | Resp 18 | Ht 66.0 in | Wt 269.4 lb

## 2018-03-24 DIAGNOSIS — G629 Polyneuropathy, unspecified: Secondary | ICD-10-CM

## 2018-03-24 DIAGNOSIS — N183 Chronic kidney disease, stage 3 unspecified: Secondary | ICD-10-CM

## 2018-03-24 DIAGNOSIS — R443 Hallucinations, unspecified: Secondary | ICD-10-CM

## 2018-03-24 DIAGNOSIS — Z794 Long term (current) use of insulin: Secondary | ICD-10-CM | POA: Diagnosis not present

## 2018-03-24 DIAGNOSIS — E1140 Type 2 diabetes mellitus with diabetic neuropathy, unspecified: Secondary | ICD-10-CM

## 2018-03-24 MED ORDER — CARPAL TUNNEL WRIST STABILIZER MISC: 0 refills | Status: DC

## 2018-03-24 MED ORDER — TRAMADOL HCL 50 MG PO TABS: 50.0000 mg | ORAL_TABLET | Freq: Two times a day (BID) | ORAL | 0 refills | Status: DC

## 2018-03-24 MED ORDER — WRIST SPLINT/LEFT LARGE MISC: 0 refills | Status: DC

## 2018-03-24 NOTE — Progress Notes (Signed)
Location:  [facility] OFFICE  Provider: DR Arletha Grippe  Code Status:  Goals of Care:  Advanced Directives 01/22/2018  Does Patient Have a Medical Advance Directive? No  Type of Advance Directive -  Does patient want to make changes to medical advance directive? -  Copy of Powers in Chart? -  Pre-existing out of facility DNR order (yellow form or pink MOST form) -     Chief Complaint  Patient presents with  . Follow-up    on hand pain (no better and now right hand hurts) and sugars    HPI: Patient is a 72 y.o. female seen today for medical management of chronic diseases.  Neuropathy in LUE unchanged on lyrica 2m BID - she has not noticed any difference in pain level. She tried gabapentin in the past also. BS are still elevated 200-300s in the PM; 60-80s during the daytime. She gets a "faint" HA in left temple area at least 2-3 times per week. Left hand throbs. She has a pain in her mid chest at least 2 times per week, electricity-like. She is followed by neurology Dr ADelice Lesch She is a poor historian due to psych d/o. Hx obtained from chart and daughter-in-law. Pt declines any surgical intervention regarding Carpal tunnel syndrome  DM - she does not take onglyza due to cost. She is taking metformin daily and lantus 40 units qHS  She is not currently on seroquel or zyprexa as hallucinations were controlled. Pt notes increased visual hallucinations off medication  Past Medical History:  Diagnosis Date  . Anemia of chronic disease   . Arthritis   . Back pain, chronic   . Blind   . Candida infection of mouth   . Diabetes mellitus   . Esophageal reflux   . GERD (gastroesophageal reflux disease)   . Hallucination   . Hand pain, left   . Hyperlipidemia   . Hypertension   . Hypertensive renal disease   . Insomnia   . Morbid obesity ([place]   . OAB (overactive bladder)   . Osteoarthritis    left shoulder  . Poor circulation   . PVD (peripheral vascular  disease) ([facility]   . Retinal detachment 11/2012  . Senile dementia with delusional features ([facility]   . Senile purpura ([facility]   . Vitamin D deficiency     Past Surgical History:  Procedure Laterality Date  . CATARACT EXTRACTION, BILATERAL    . INCISION AND DRAINAGE PERIRECTAL ABSCESS  11/20/2011   Procedure: IRRIGATION AND DEBRIDEMENT PERIRECTAL ABSCESS;  Surgeon: BZenovia Jarred MD;  Location: [facility]  Service: General;  Laterality: Right;  Incision and Drainage of Right Groin Abscess     reports that she quit smoking about 41 years ago. Her smoking use included cigarettes. She has a 3.75 pack-year smoking history. She has never used smokeless tobacco. She reports that she does not drink alcohol or use drugs. Social History   Socioeconomic History  . Marital status: Divorced    Spouse name: Not on file  . Number of children: Not on file  . Years of education: Not on file  . Highest education level: Not on file  Occupational History  . Not on file  Social Needs  . Financial resource strain: Not hard at all  . Food insecurity:    Worry: Never true    Inability: Never true  . Transportation needs:    Medical: No    Non-medical: No  Tobacco Use  .  Smoking status: Former Smoker    Packs/day: 0.25    Years: 15.00    Pack years: 3.75    Types: Cigarettes    Last attempt to quit: 08/12/1976    Years since quitting: 41.6  . Smokeless tobacco: Never Used  Substance and Sexual Activity  . Alcohol use: No  . Drug use: No  . Sexual activity: Not Currently  Lifestyle  . Physical activity:    Days per week: 0 days    Minutes per session: 0 min  . Stress: Not at all  Relationships  . Social connections:    Talks on phone: More than three times a week    Gets together: More than three times a week    Attends religious service: Never    Active member of club or organization: No    Attends meetings of clubs or organizations: Never    Relationship status: Divorced  . Intimate partner  violence:    Fear of current or ex partner: No    Emotionally abused: No    Physically abused: No    Forced sexual activity: No  Other Topics Concern  . Not on file  Social History Narrative   Social History      Diet? none      Do you drink/eat things with caffeine? Yes, coffee and tea      Marital status?                 divorced                   What year were you married?      Do you live in a house, apartment, assisted living, condo, trailer, etc.? house      Is it one or more stories? yes      How many persons live in your home? 4- lives with daughter in law and son and granddaughter      Do you have any pets in your home? (please list) 1 dog      Highest level of education completed? 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                 no                     Type & how often? n/a      Advanced Directives      Do you have a living will? no      Do you have a DNR form?                                  If not, do you want to discuss one? no      Do you have signed POA/HPOA for forms? yes      Functional Status      Do you have difficulty bathing or dressing yourself? yes      Do you have difficulty preparing food or eating? yes      Do you have difficulty managing your medications? yes      Do you have difficulty managing your finances? yes      Do you have difficulty affording your medications? yes    Family History  Problem Relation Age of Onset  . Heart disease  Mother   . Diabetes Mother   . Hyperlipidemia Son   . Hypertension Son     Allergies  Allergen Reactions  . Oxycodone Other (See Comments)    "I'm swimming; I'm floating; I'm diving; I'm in another world."    Outpatient Encounter Medications as of 03/24/2018  Medication Sig  . ACCU-CHEK SOFTCLIX LANCETS lancets Use to test blood sugar three times daily. Dx: E11.40  . amLODipine (NORVASC) 10 MG tablet  Take 10 mg by mouth daily.   Marland Kitchen aspirin EC 81 MG tablet Take 81 mg by mouth daily.  Marland Kitchen atorvastatin (LIPITOR) 20 MG tablet Take 1 tablet (20 mg total) by mouth daily at 6 PM.  . Blood Glucose Monitoring Suppl (ACCU-CHEK AVIVA PLUS) w/Device KIT Use to test Blood sugar three times daily. Dx: E11.40  . CALCIUM PO Take 1,000 mg by mouth 2 (two) times daily.   . cilostazol (PLETAL) 100 MG tablet Take 0.5 tablets (50 mg total) by mouth every morning.  . docusate sodium (COLACE) 100 MG capsule Take 100 mg by mouth 2 (two) times daily. Stool softener  . esomeprazole (NEXIUM) 20 MG capsule Take 20 mg by mouth every morning.  Marland Kitchen glucose blood test strip Use to test blood sugar three times daily. Dx: E11.40  . insulin glargine (LANTUS) 100 UNIT/ML injection Inject 0.4 mLs (40 Units total) into the skin at bedtime. Dx: E11.40  . Insulin Syringe-Needle U-100 (INSULIN SYRINGE 1CC/31GX5/16") 31G X 5/16" 1 ML MISC 1 Syringe by Does not apply route at bedtime. Dx: E11.40  . IRON PO Take 1 tablet by mouth every evening.   . labetalol (NORMODYNE) 100 MG tablet Take 1 tablet (100 mg total) by mouth 2 (two) times daily.  . metFORMIN (GLUCOPHAGE) 1000 MG tablet Take 1 tablet (1,000 mg total) by mouth daily with breakfast.  . olmesartan (BENICAR) 40 MG tablet Take 1 tablet (40 mg total) by mouth daily.  . pregabalin (LYRICA) 50 MG capsule Take 1 capsule (50 mg total) by mouth 2 (two) times daily.  . saxagliptin HCl (ONGLYZA) 5 MG TABS tablet Take 5 mg by mouth daily.   Marland Kitchen torsemide (DEMADEX) 20 MG tablet Take 0.5 tablets (10 mg total) by mouth every morning.  . valsartan (DIOVAN) 320 MG tablet Take 320 mg by mouth daily.   . QUEtiapine (SEROQUEL) 50 MG tablet TAKE 1 TABLET BY MOUTH EVERY MORNING AND 2 TABLETS EVERY NIGHT AT BEDTIME   No facility-administered encounter medications on file as of 03/24/2018.     Review of Systems:  Review of Systems  Unable to perform ROS: Psychiatric disorder    Health Maintenance    Topic Date Due  . DEXA SCAN  12/06/2008  . MAMMOGRAM  10/09/2017  . INFLUENZA VACCINE  03/12/2018  . TETANUS/TDAP  12/02/2018 (Originally 12/07/1964)  . Hepatitis C Screening  12/02/2018 (Originally 01-01-1946)  . HEMOGLOBIN A1C  07/18/2018  . FOOT EXAM  08/12/2018  . Fecal DNA (Cologuard)  02/12/2021  . PNA vac Low Risk Adult  Completed  . OPHTHALMOLOGY EXAM  Discontinued    Physical Exam: Vitals:   03/24/18 1103  BP: 118/60  Pulse: 74  Resp: 18  Temp: 98.6 F (37 C)  TempSrc: Oral  SpO2: 95%  Weight: 269 lb 6.4 oz (122.2 kg)  Height: '5\' 6"'  (1.676 m)   Body mass index is 43.48 kg/m. Physical Exam  Constitutional: She is oriented to person, place, and time. She appears well-developed and well-nourished.  HENT:  Mouth/Throat: Oropharynx is  clear and moist. No oropharyngeal exudate.  MMM; no oral thrush  Eyes: Pupils are equal, round, and reactive to light. No scleral icterus.  Right corneal clouding; blind in OU  Neck: Neck supple. Carotid bruit is not present. No tracheal deviation present. No thyromegaly present.  Cardiovascular: Normal rate, regular rhythm and intact distal pulses. Exam reveals no gallop and no friction rub.  Murmur (1/6 SEM) heard. +1 pitting L>RLE ede,a. No calf TTP  Pulmonary/Chest: Effort normal and breath sounds normal. No stridor. No respiratory distress. She has no wheezes. She has no rales.  Abdominal: Soft. Normal appearance and bowel sounds are normal. She exhibits no distension and no mass. There is no hepatomegaly. There is no tenderness. There is no rigidity, no rebound and no guarding. No hernia.  Musculoskeletal: She exhibits edema and tenderness (left lateral epicodyle with swelling and reduced supination).  Neg Tinel's on left  Lymphadenopathy:    She has no cervical adenopathy.  Neurological: She is alert and oriented to person, place, and time. She has normal reflexes.  Grip strength 4/5 left but intact on right;   Skin: Skin is  warm and dry. No rash noted.  Psychiatric: She has a normal mood and affect. Her behavior is normal. Judgment and thought content normal.    Labs reviewed: Basic Metabolic Panel: Recent Labs    08/28/17 10/16/17 1007 01/16/18 0942  NA 142 144 142  K 5.4* 4.1 4.9  CL  --  108 107  CO2  --  30 30  GLUCOSE  --  123* 223*  BUN 23* 19 23  CREATININE 1.3* 1.25* 1.41*  CALCIUM  --  10.0 10.0  TSH  --  2.00  --    Liver Function Tests: Recent Labs    05/27/17 08/28/17 10/16/17 1007  AST 11* 10* 12  ALT '9 9 11  ' ALKPHOS 53 66  --   BILITOT  --   --  0.4  PROT  --   --  6.0*   No results for input(s): LIPASE, AMYLASE in the last 8760 hours. No results for input(s): AMMONIA in the last 8760 hours. CBC: Recent Labs    05/27/17 08/28/17 10/16/17 1007  WBC 4.1 4.4 4.1  NEUTROABS  --   --  1,935  HGB 10.0* 9.5* 10.1*  HCT 30* 32* 30.7*  MCV  --   --  89.8  PLT 175 191 212   Lipid Panel: Recent Labs    05/27/17 08/28/17 10/16/17 1007  CHOL 113 128 121  HDL 54 65 59  LDLCALC 47 54 46  TRIG 62 46 78  CHOLHDL  --   --  2.1   Lab Results  Component Value Date   HGBA1C 8.9 (H) 01/16/2018    Procedures since last visit: No results found.  Assessment/Plan   ICD-10-CM   1. Type 2 diabetes mellitus with diabetic neuropathy, with long-term current use of insulin (HCC) E11.40 traMADol (ULTRAM) 50 MG tablet   Z79.4   2. Hallucination R44.3   3. CKD (chronic kidney disease) stage 3, GFR 30-59 ml/min (HCC) N18.3   4. Neuropathy G62.9 traMADol (ULTRAM) 50 MG tablet    Elastic Bandages & Supports (CARPAL TUNNEL WRIST STABILIZER) MISC    RESTART ZYPREXA 5MG AT BEDTIME  START TRAMADOL 50MG 2 TIMES DAILY FOR PAIN  SCRIPT FOR CARPAL TUNNEL BRACE  May need Orthopedic evaluation if carpal tunnel pain does not improve in next 2-4 weeks  Follow up with neurology as scheduled  Follow  up in 2 mos with Janett Billow for LUE neuropathy, DM, hallucinations. Fasting labs prior to appt  (cmp, a1c, lipid panel)   Jlyn Cerros S. Perlie Gold  [facility] [address] [place], [place] [address] [phone] Cell (Monday-Friday 8 AM - 5 PM) [phone] After 5 PM and follow prompts

## 2018-03-24 NOTE — Patient Instructions (Addendum)
RESTART ZYPREXA 5MG  AT BEDTIME  START TRAMADOL 50MG  2 TIMES DAILY FOR PAIN  SCRIPT FOR CARPAL TUNNEL BRACE  May need Orthopedic evaluation if carpal tunnel pain does not improve in next 2-4 weeks  Follow up with neurology as scheduled  Follow up in 2 mos with [name] for LUE neuropathy, DM, hallucinations. Fasting labs prior to appt

## 2018-04-15 ENCOUNTER — Telehealth: Payer: Self-pay | Admitting: *Deleted

## 2018-04-15 NOTE — Telephone Encounter (Signed)
I recommend pt be seen in the ER if pt has infected sores as she may need more aggressive care than can be done at home; she may need to call EMS to take her to the ER

## 2018-04-15 NOTE — Telephone Encounter (Signed)
Patient daughter came into office with pictures of her mother's sores on the back of her legs and buttock. The sores are open and red. Daughter stated that she is unable to bring patient in for an appointment due to Dementia and agitation. Stated that the patient fights you. Patient is wetting herself and sitting in it for long periods of time because she will not let someone change her. Daughter is worried the sores are getting worse. Please Advise.

## 2018-04-16 ENCOUNTER — Emergency Department (HOSPITAL_COMMUNITY)
Admission: EM | Admit: 2018-04-16 | Discharge: 2018-04-16 | Disposition: A | Discharge status: Home / Self Care | Payer: Medicare HMO | Attending: Emergency Medicine | Admitting: Emergency Medicine

## 2018-04-16 ENCOUNTER — Other Ambulatory Visit: Payer: Self-pay

## 2018-04-16 ENCOUNTER — Encounter (HOSPITAL_COMMUNITY): Payer: Self-pay | Admitting: Emergency Medicine

## 2018-04-16 DIAGNOSIS — T07XXXA Unspecified multiple injuries, initial encounter: Secondary | ICD-10-CM

## 2018-04-16 DIAGNOSIS — S71102A Unspecified open wound, left thigh, initial encounter: Secondary | ICD-10-CM | POA: Insufficient documentation

## 2018-04-16 DIAGNOSIS — I10 Essential (primary) hypertension: Secondary | ICD-10-CM | POA: Diagnosis not present

## 2018-04-16 DIAGNOSIS — Z794 Long term (current) use of insulin: Secondary | ICD-10-CM | POA: Diagnosis not present

## 2018-04-16 DIAGNOSIS — S71101A Unspecified open wound, right thigh, initial encounter: Secondary | ICD-10-CM | POA: Insufficient documentation

## 2018-04-16 DIAGNOSIS — E119 Type 2 diabetes mellitus without complications: Secondary | ICD-10-CM | POA: Insufficient documentation

## 2018-04-16 DIAGNOSIS — Z79899 Other long term (current) drug therapy: Secondary | ICD-10-CM | POA: Insufficient documentation

## 2018-04-16 DIAGNOSIS — Y929 Unspecified place or not applicable: Secondary | ICD-10-CM | POA: Insufficient documentation

## 2018-04-16 DIAGNOSIS — Z7982 Long term (current) use of aspirin: Secondary | ICD-10-CM | POA: Diagnosis not present

## 2018-04-16 DIAGNOSIS — F0390 Unspecified dementia without behavioral disturbance: Secondary | ICD-10-CM | POA: Diagnosis not present

## 2018-04-16 DIAGNOSIS — X58XXXA Exposure to other specified factors, initial encounter: Secondary | ICD-10-CM | POA: Diagnosis not present

## 2018-04-16 DIAGNOSIS — Y939 Activity, unspecified: Secondary | ICD-10-CM | POA: Diagnosis not present

## 2018-04-16 DIAGNOSIS — Z87891 Personal history of nicotine dependence: Secondary | ICD-10-CM | POA: Diagnosis not present

## 2018-04-16 DIAGNOSIS — Y999 Unspecified external cause status: Secondary | ICD-10-CM | POA: Diagnosis not present

## 2018-04-16 LAB — CBC WITH DIFFERENTIAL/PLATELET
Abs Immature Granulocytes: 0 10*3/uL (ref 0.0–0.1)
BASOS ABS: 0 10*3/uL (ref 0.0–0.1)
BASOS PCT: 1 %
EOS ABS: 0.1 10*3/uL (ref 0.0–0.7)
Eosinophils Relative: 3 %
HCT: 34.3 % — ABNORMAL LOW (ref 36.0–46.0)
Hemoglobin: 10.8 g/dL — ABNORMAL LOW (ref 12.0–15.0)
Immature Granulocytes: 0 %
Lymphocytes Relative: 25 %
Lymphs Abs: 1.3 10*3/uL (ref 0.7–4.0)
MCH: 29.6 pg (ref 26.0–34.0)
MCHC: 31.5 g/dL (ref 30.0–36.0)
MCV: 94 fL (ref 78.0–100.0)
MONO ABS: 0.3 10*3/uL (ref 0.1–1.0)
MONOS PCT: 5 %
Neutro Abs: 3.5 10*3/uL (ref 1.7–7.7)
Neutrophils Relative %: 66 %
PLATELETS: 208 10*3/uL (ref 150–400)
RBC: 3.65 MIL/uL — ABNORMAL LOW (ref 3.87–5.11)
RDW: 14.2 % (ref 11.5–15.5)
WBC: 5.3 10*3/uL (ref 4.0–10.5)

## 2018-04-16 LAB — COMPREHENSIVE METABOLIC PANEL
ALT: 28 U/L (ref 0–44)
ANION GAP: 9 (ref 5–15)
AST: 45 U/L — ABNORMAL HIGH (ref 15–41)
Albumin: 3.1 g/dL — ABNORMAL LOW (ref 3.5–5.0)
Alkaline Phosphatase: 50 U/L (ref 38–126)
BUN: 22 mg/dL (ref 8–23)
CHLORIDE: 108 mmol/L (ref 98–111)
CO2: 26 mmol/L (ref 22–32)
CREATININE: 1.49 mg/dL — AB (ref 0.44–1.00)
Calcium: 10.3 mg/dL (ref 8.9–10.3)
GFR calc non Af Amer: 33 mL/min — ABNORMAL LOW (ref >60)
GFR, EST AFRICAN AMERICAN: 39 mL/min — AB (ref >60)
Glucose, Bld: 303 mg/dL — ABNORMAL HIGH (ref 70–99)
POTASSIUM: 4.4 mmol/L (ref 3.5–5.1)
Sodium: 143 mmol/L (ref 135–145)
Total Bilirubin: 0.5 mg/dL (ref 0.3–1.2)
Total Protein: 6 g/dL — ABNORMAL LOW (ref 6.5–8.1)

## 2018-04-16 LAB — I-STAT CG4 LACTIC ACID, ED: LACTIC ACID, VENOUS: 1.69 mmol/L (ref 0.5–1.9)

## 2018-04-16 LAB — CBG MONITORING, ED: GLUCOSE-CAPILLARY: 287 mg/dL — AB (ref 70–99)

## 2018-04-16 MED ORDER — SILVER SULFADIAZINE 1 % EX CREA
TOPICAL_CREAM | Freq: Two times a day (BID) | CUTANEOUS | Status: DC
  Administered 2018-04-16: 18:00:00 via TOPICAL
  Filled 2018-04-16: qty 85

## 2018-04-16 NOTE — Telephone Encounter (Signed)
Patient daughter notified and agreed.  

## 2018-04-16 NOTE — Discharge Instructions (Addendum)
Follow-up with her doctor.  Keep the areas clean and dry.  Use the cream twice a day.

## 2018-04-16 NOTE — Progress Notes (Signed)
CSW followed up with consult. Plan is for pt to transition home with home health. RNCM working with family to establish home health.   Montine Circle, [facility]  [phone]

## 2018-04-16 NOTE — ED Triage Notes (Signed)
Pt arrives with family with c/o of skin break down on the back of her upper thighs. Family reports she has dementia and goes through stages where she does not get out of bed. Family has picture os red blisters on the back on patients legs.

## 2018-04-16 NOTE — ED Provider Notes (Signed)
[facility] EMERGENCY DEPARTMENT Provider Note   CSN: [id number] Arrival date & time: 04/16/18  1130     History   Chief Complaint Chief Complaint  Patient presents with  . Wound Check    HPI [name] is a 72 y.o. female.  HPI Patient presents to the emergency department with several wounds to the back of both thighs.  The daughter states that she noticed him 1 week ago.  She had been laying in some urine and noticed that they had blistered up after this.  Patient states nothing seems make condition better or worse.  She states that she called their doctor's office with no advice other than coming to the emergency department.  Patient has not had any fevers or vomiting, or syncope the patient is unable to give any history due to dementia. Past Medical History:  Diagnosis Date  . Anemia of chronic disease   . Arthritis   . Back pain, chronic   . Blind   . Candida infection of mouth   . Diabetes mellitus   . Esophageal reflux   . GERD (gastroesophageal reflux disease)   . Hallucination   . Hand pain, left   . Hyperlipidemia   . Hypertension   . Hypertensive renal disease   . Insomnia   . Morbid obesity ([place])   . OAB (overactive bladder)   . Osteoarthritis    left shoulder  . Poor circulation   . PVD (peripheral vascular disease) ([place])   . Retinal detachment 11/2012  . Senile dementia with delusional features ([place])   . Senile purpura ([place])   . Vitamin D deficiency     Patient Active Problem List   Diagnosis Date Noted  . Vitamin D deficiency 12/10/2017  . Anemia 12/10/2017  . Hypertensive renal disease 12/10/2017  . Hypercalcemia 11/23/2011    Class: Acute  . Legal blindness, as defined in [place] 11/17/2011  . Type II or unspecified type diabetes mellitus with unspecified complication, uncontrolled 11/17/2011  . Essential hypertension, benign 11/17/2011  . Obesity, unspecified 11/17/2011  . Osteoarthrosis, unspecified whether generalized  or localized, shoulder region 11/17/2011  . Esophageal reflux 11/17/2011  . PVD 06/08/2010    Past Surgical History:  Procedure Laterality Date  . CATARACT EXTRACTION, BILATERAL    . INCISION AND DRAINAGE PERIRECTAL ABSCESS  11/20/2011   Procedure: IRRIGATION AND DEBRIDEMENT PERIRECTAL ABSCESS;  Surgeon: Zenovia Jarred, MD;  Location: [facility];  Service: General;  Laterality: Right;  Incision and Drainage of Right Groin Abscess     OB History   None      Home Medications    Prior to Admission medications   Medication Sig Start Date End Date Taking? Authorizing Provider  ACCU-CHEK SOFTCLIX LANCETS lancets Use to test blood sugar three times daily. Dx: E11.40 01/28/18   Lauree Chandler, NP  amLODipine (NORVASC) 10 MG tablet Take 10 mg by mouth daily.  11/03/13   [provider]  aspirin EC 81 MG tablet Take 81 mg by mouth daily.    [provider]  atorvastatin (LIPITOR) 20 MG tablet Take 1 tablet (20 mg total) by mouth daily at 6 PM. 02/17/14   Tisovec, Fransico Him, MD  Blood Glucose Monitoring Suppl (ACCU-CHEK AVIVA PLUS) w/Device KIT Use to test Blood sugar three times daily. Dx: E11.40 01/28/18   Lauree Chandler, NP  CALCIUM PO Take 1,000 mg by mouth 2 (two) times daily.     [provider]  cilostazol (  PLETAL) 100 MG tablet Take 0.5 tablets (50 mg total) by mouth every morning. 01/22/18   Lauree Chandler, NP  docusate sodium (COLACE) 100 MG capsule Take 100 mg by mouth 2 (two) times daily. Stool softener    [provider]  Elastic Bandages & Supports (CARPAL TUNNEL WRIST STABILIZER) MISC Apply to left wrist during the day, remove at night 03/24/18   Gildardo Cranker, DO  esomeprazole (NEXIUM) 20 MG capsule Take 20 mg by mouth every morning.    [provider]  glucose blood test strip Use to test blood sugar three times daily. Dx: E11.40 01/28/18   Lauree Chandler, NP  insulin glargine (LANTUS) 100 UNIT/ML injection Inject 0.4 mLs (40  Units total) into the skin at bedtime. Dx: E11.40 01/01/18   Lauree Chandler, NP  Insulin Syringe-Needle U-100 (INSULIN SYRINGE 1CC/31GX5/16") 31G X 5/16" 1 ML MISC 1 Syringe by Does not apply route at bedtime. Dx: E11.40 01/01/18   Lauree Chandler, NP  IRON PO Take 1 tablet by mouth every evening.     [provider]  labetalol (NORMODYNE) 100 MG tablet Take 1 tablet (100 mg total) by mouth 2 (two) times daily. 01/22/18   Lauree Chandler, NP  metFORMIN (GLUCOPHAGE) 1000 MG tablet Take 1 tablet (1,000 mg total) by mouth daily with breakfast. 03/16/18   Reed, Tiffany L, DO  olmesartan (BENICAR) 40 MG tablet Take 1 tablet (40 mg total) by mouth daily. 03/16/18   Reed, Tiffany L, DO  pregabalin (LYRICA) 50 MG capsule Take 1 capsule (50 mg total) by mouth 2 (two) times daily. 01/22/18   Lauree Chandler, NP  QUEtiapine (SEROQUEL) 50 MG tablet TAKE 1 TABLET BY MOUTH EVERY MORNING AND 2 TABLETS EVERY NIGHT AT BEDTIME 03/12/17   [provider]  torsemide (DEMADEX) 20 MG tablet Take 0.5 tablets (10 mg total) by mouth every morning. 01/22/18   Lauree Chandler, NP  traMADol (ULTRAM) 50 MG tablet Take 1 tablet (50 mg total) by mouth 2 (two) times daily. For pain 03/24/18   Gildardo Cranker, DO  valsartan (DIOVAN) 320 MG tablet Take 320 mg by mouth daily.  12/15/15   [provider]    Family History Family History  Problem Relation Age of Onset  . Heart disease Mother   . Diabetes Mother   . Hyperlipidemia Son   . Hypertension Son     Social History Social History   Tobacco Use  . Smoking status: Former Smoker    Packs/day: 0.25    Years: 15.00    Pack years: 3.75    Types: Cigarettes    Last attempt to quit: 08/12/1976    Years since quitting: 41.7  . Smokeless tobacco: Never Used  Substance Use Topics  . Alcohol use: No  . Drug use: No     Allergies   Oxycodone   Review of Systems Review of Systems  Level 5 caveat applies due to dementia Physical  Exam Updated Vital Signs BP (!) 142/68   Pulse 78   Temp 98.9 F (37.2 C) (Oral)   Resp 18   SpO2 98%   Physical Exam  Constitutional: She appears well-developed and well-nourished. She appears lethargic. No distress.  HENT:  Head: Normocephalic and atraumatic.  Mouth/Throat: Oropharynx is clear and moist.  Eyes: Pupils are equal, round, and reactive to light.  Neck: Normal range of motion. Neck supple.  Cardiovascular: Normal rate, regular rhythm and normal heart sounds. Exam reveals no gallop  and no friction rub.  No murmur heard. Pulmonary/Chest: Effort normal and breath sounds normal. No respiratory distress. She has no wheezes.  Neurological: She appears lethargic. She exhibits normal muscle tone. Coordination normal.  Skin: Skin is warm and dry. Capillary refill takes less than 2 seconds. No rash noted. No erythema.  Psychiatric: She has a normal mood and affect. Her behavior is normal.  Nursing note and vitals reviewed.    ED Treatments / Results  Labs (all labs ordered are listed, but only abnormal results are displayed) Labs Reviewed  CBC WITH DIFFERENTIAL/PLATELET - Abnormal; Notable for the following components:      Result Value   RBC 3.65 (*)    Hemoglobin 10.8 (*)    HCT 34.3 (*)    All other components within normal limits  COMPREHENSIVE METABOLIC PANEL - Abnormal; Notable for the following components:   Glucose, Bld 303 (*)    Creatinine, Ser 1.49 (*)    Total Protein 6.0 (*)    Albumin 3.1 (*)    AST 45 (*)    GFR calc non Af Amer 33 (*)    GFR calc Af Amer 39 (*)    All other components within normal limits  CBG MONITORING, ED - Abnormal; Notable for the following components:   Glucose-Capillary 287 (*)    All other components within normal limits  I-STAT CG4 LACTIC ACID, ED  I-STAT CG4 LACTIC ACID, ED    EKG None  Radiology No results found.  Procedures Procedures (including critical care time)  Medications Ordered in ED Medications -  No data to display   Initial Impression / Assessment and Plan / ED Course  I have reviewed the triage vital signs and the nursing notes.  Pertinent labs & imaging results that were available during my care of the patient were reviewed by me and considered in my medical decision making (see chart for details).     Patient has been stable here in the emergency department.  She will need follow-up with her primary doctor.  Case management also got involved and will set up all home health services for the patient.  Final Clinical Impressions(s) / ED Diagnoses   Final diagnoses:  None    ED Discharge Orders    None       Dalia Heading, PA-C 04/16/18 1747    Malvin Johns, MD 04/16/18 1757

## 2018-04-16 NOTE — Care Management Note (Signed)
Case Management Note  Patient Details  Name: [name] MRN: [id number] Date of Birth: 1946-07-11  Subjective/Objective:            Patient told to come here by PCP for wound evaluation        Action/Plan: ED CM met with patient and (daughter) Addylynn Balin [phone]. Daughter reports she needs assistance with managing patient's care at home. Patient developed a sacral wound that she has been attempting manage at home. Patient is a diabetic and has had increase weakness as per daughter,  Patient may benefit from [facility] services patient and daughter both are agreeable. Denies any need for DME at this time.  Discussed with EDP wound care clinic referral, appt arranged for 9/17 at 8:30a daughter is agreeable and will transport patient to appointment. CM offered choice of [place] agency [facility] selected. Referral called in to [facility] liaison.  ED CM informed patient and daughter a nurse will contacted them at verified number within 24- 48 hours to arrange initial visit. Patient and Daughter verbalized understanding teach back done.   Expected Discharge Date:    04/16/2018              Expected Discharge Plan:  [place]  In-House Referral:  Clinical Social Work  Discharge planning Services  [facility]  Post Acute Care Choice:    Choice offered to:  Adult Children, Patient(Daughter Tula Nakayama)  DME Arranged:    DME Agency:     HH Arranged:  RN, Nurse's Aide, PT, OT, Social Work [employer] Agency:  [place]  Status of Service:  Completed, signed off  If discussed at [employer], dates discussed:    Additional CommentsLaurena Slimmer, RN 04/16/2018, 6:34 PM

## 2018-04-17 ENCOUNTER — Other Ambulatory Visit: Payer: Self-pay

## 2018-04-17 NOTE — Patient Outreach (Signed)
[facility]) Care Management  04/17/2018  [name] [birth year] [id number]   Telephone Screen  Referral Date: 04/17/18 Referral Source: [facility] ED RN CM(W. Jackquline Bosch) Referral Reason: " lives at home with daughter and may benefit from [facility], patient is being discharged from ED on 04/16/18" Insurance: [employer]   Outreach attempt # 1 to patient/caregiver. Spoke with daughter in law/caregiver-Tywana(DPR on file).   Social: Patient resides in their home. Daughter in law and son are the main caregivers for patient. She states that patient is dependent with most ADLs except she can feed herself. She is dependent with IADLs. No recent falls.   Conditions: Per chart review, patient has PMH of dementia, anemia, arthritis, DM, GERD, HLD, HTN, obesity,OA,PVD and overactive bladder. Of note, patient was in the ED on yesterday to be evaluated for skin breakdown areas. Caregiver attributes it to urinary leakage. Family states that patient can become combative and resistant to allowing them to perform personal care at times. Patient has been set up for outpatient follow up at wound care center.    Medications: Daughter in law states that they are managing patient's meds. She voices that patient is taking meds without difficulty. She denies any trouble affording and/or managing meds at this time.    Appointments: Patient followed by PCP and sees regularly. Appt with wound center on 04/28/18.   Consent: [facility] services reviewed and discussed with caregiver. RN CM explained to caregiver the difference between [facility] and [facility] services. Caregiver voices that the only assistance they need now are "hands on care to assist with care needs." Advised that [facility] does not provide these services. Per chart, [facility] services(RN, aide, SW, PT,OT) ordered during ED visit on yesterday. Caregiver is aware of this and feels like this is all they need at this time. She is agreeable to RN CM mailing [facility] brochure and  magnet for future reference in case needs arise in the future. Caregiver was appreciative of call but does not feel like she needs [facility] at this time.    Plan: RN CM will close case at this time. RN CM will send [facility] brochure and magnet via mail to patient/caregivers.   Antionette Fairy, RN,BSN,CCM [facility] Care Management Telephonic Care Management Coordinator Direct Phone: [phone] Toll Free: [phone] Fax: [phone]

## 2018-04-19 DIAGNOSIS — H548 Legal blindness, as defined in USA: Secondary | ICD-10-CM | POA: Diagnosis not present

## 2018-04-19 DIAGNOSIS — D692 Other nonthrombocytopenic purpura: Secondary | ICD-10-CM

## 2018-04-19 DIAGNOSIS — E119 Type 2 diabetes mellitus without complications: Secondary | ICD-10-CM | POA: Diagnosis not present

## 2018-04-19 DIAGNOSIS — F0390 Unspecified dementia without behavioral disturbance: Secondary | ICD-10-CM | POA: Diagnosis not present

## 2018-04-19 DIAGNOSIS — N189 Chronic kidney disease, unspecified: Secondary | ICD-10-CM | POA: Diagnosis not present

## 2018-04-19 DIAGNOSIS — D649 Anemia, unspecified: Secondary | ICD-10-CM | POA: Diagnosis not present

## 2018-04-19 DIAGNOSIS — I129 Hypertensive chronic kidney disease with stage 1 through stage 4 chronic kidney disease, or unspecified chronic kidney disease: Secondary | ICD-10-CM | POA: Diagnosis not present

## 2018-04-19 DIAGNOSIS — L258 Unspecified contact dermatitis due to other agents: Secondary | ICD-10-CM | POA: Diagnosis not present

## 2018-04-19 DIAGNOSIS — I739 Peripheral vascular disease, unspecified: Secondary | ICD-10-CM | POA: Diagnosis not present

## 2018-04-27 ENCOUNTER — Other Ambulatory Visit: Payer: Self-pay | Admitting: *Deleted

## 2018-04-27 DIAGNOSIS — G629 Polyneuropathy, unspecified: Secondary | ICD-10-CM

## 2018-04-27 DIAGNOSIS — Z794 Long term (current) use of insulin: Principal | ICD-10-CM

## 2018-04-27 DIAGNOSIS — E1140 Type 2 diabetes mellitus with diabetic neuropathy, unspecified: Secondary | ICD-10-CM

## 2018-04-27 MED ORDER — PREGABALIN 50 MG PO CAPS: 50.0000 mg | ORAL_CAPSULE | Freq: Two times a day (BID) | ORAL | 1 refills | Status: DC

## 2018-04-27 NOTE — Telephone Encounter (Signed)
Patient caregiver requested refill. Faxed.  

## 2018-04-28 ENCOUNTER — Encounter (HOSPITAL_BASED_OUTPATIENT_CLINIC_OR_DEPARTMENT_OTHER): Payer: Medicare HMO | Attending: Internal Medicine

## 2018-04-28 DIAGNOSIS — F0390 Unspecified dementia without behavioral disturbance: Secondary | ICD-10-CM | POA: Diagnosis not present

## 2018-04-28 DIAGNOSIS — H548 Legal blindness, as defined in USA: Secondary | ICD-10-CM | POA: Diagnosis not present

## 2018-04-28 DIAGNOSIS — D692 Other nonthrombocytopenic purpura: Secondary | ICD-10-CM | POA: Diagnosis not present

## 2018-04-28 DIAGNOSIS — E119 Type 2 diabetes mellitus without complications: Secondary | ICD-10-CM | POA: Diagnosis not present

## 2018-04-28 DIAGNOSIS — I129 Hypertensive chronic kidney disease with stage 1 through stage 4 chronic kidney disease, or unspecified chronic kidney disease: Secondary | ICD-10-CM | POA: Diagnosis not present

## 2018-04-28 DIAGNOSIS — N189 Chronic kidney disease, unspecified: Secondary | ICD-10-CM | POA: Diagnosis not present

## 2018-04-28 DIAGNOSIS — D649 Anemia, unspecified: Secondary | ICD-10-CM | POA: Diagnosis not present

## 2018-04-28 DIAGNOSIS — I739 Peripheral vascular disease, unspecified: Secondary | ICD-10-CM | POA: Diagnosis not present

## 2018-04-28 DIAGNOSIS — L258 Unspecified contact dermatitis due to other agents: Secondary | ICD-10-CM | POA: Diagnosis not present

## 2018-04-29 DIAGNOSIS — H548 Legal blindness, as defined in USA: Secondary | ICD-10-CM | POA: Diagnosis not present

## 2018-04-29 DIAGNOSIS — D649 Anemia, unspecified: Secondary | ICD-10-CM | POA: Diagnosis not present

## 2018-04-29 DIAGNOSIS — D692 Other nonthrombocytopenic purpura: Secondary | ICD-10-CM | POA: Diagnosis not present

## 2018-04-29 DIAGNOSIS — E119 Type 2 diabetes mellitus without complications: Secondary | ICD-10-CM | POA: Diagnosis not present

## 2018-04-29 DIAGNOSIS — L258 Unspecified contact dermatitis due to other agents: Secondary | ICD-10-CM | POA: Diagnosis not present

## 2018-04-29 DIAGNOSIS — I739 Peripheral vascular disease, unspecified: Secondary | ICD-10-CM | POA: Diagnosis not present

## 2018-04-29 DIAGNOSIS — N189 Chronic kidney disease, unspecified: Secondary | ICD-10-CM | POA: Diagnosis not present

## 2018-04-29 DIAGNOSIS — I129 Hypertensive chronic kidney disease with stage 1 through stage 4 chronic kidney disease, or unspecified chronic kidney disease: Secondary | ICD-10-CM | POA: Diagnosis not present

## 2018-04-29 DIAGNOSIS — F0390 Unspecified dementia without behavioral disturbance: Secondary | ICD-10-CM | POA: Diagnosis not present

## 2018-04-30 DIAGNOSIS — F0390 Unspecified dementia without behavioral disturbance: Secondary | ICD-10-CM | POA: Diagnosis not present

## 2018-04-30 DIAGNOSIS — E119 Type 2 diabetes mellitus without complications: Secondary | ICD-10-CM | POA: Diagnosis not present

## 2018-04-30 DIAGNOSIS — L258 Unspecified contact dermatitis due to other agents: Secondary | ICD-10-CM | POA: Diagnosis not present

## 2018-04-30 DIAGNOSIS — I739 Peripheral vascular disease, unspecified: Secondary | ICD-10-CM | POA: Diagnosis not present

## 2018-04-30 DIAGNOSIS — N189 Chronic kidney disease, unspecified: Secondary | ICD-10-CM | POA: Diagnosis not present

## 2018-04-30 DIAGNOSIS — D692 Other nonthrombocytopenic purpura: Secondary | ICD-10-CM | POA: Diagnosis not present

## 2018-04-30 DIAGNOSIS — H548 Legal blindness, as defined in USA: Secondary | ICD-10-CM | POA: Diagnosis not present

## 2018-04-30 DIAGNOSIS — D649 Anemia, unspecified: Secondary | ICD-10-CM | POA: Diagnosis not present

## 2018-04-30 DIAGNOSIS — I129 Hypertensive chronic kidney disease with stage 1 through stage 4 chronic kidney disease, or unspecified chronic kidney disease: Secondary | ICD-10-CM | POA: Diagnosis not present

## 2018-05-01 ENCOUNTER — Telehealth: Payer: Self-pay | Admitting: *Deleted

## 2018-05-01 DIAGNOSIS — D649 Anemia, unspecified: Secondary | ICD-10-CM | POA: Diagnosis not present

## 2018-05-01 DIAGNOSIS — E119 Type 2 diabetes mellitus without complications: Secondary | ICD-10-CM | POA: Diagnosis not present

## 2018-05-01 DIAGNOSIS — I129 Hypertensive chronic kidney disease with stage 1 through stage 4 chronic kidney disease, or unspecified chronic kidney disease: Secondary | ICD-10-CM | POA: Diagnosis not present

## 2018-05-01 DIAGNOSIS — D692 Other nonthrombocytopenic purpura: Secondary | ICD-10-CM | POA: Diagnosis not present

## 2018-05-01 DIAGNOSIS — H548 Legal blindness, as defined in USA: Secondary | ICD-10-CM | POA: Diagnosis not present

## 2018-05-01 DIAGNOSIS — L258 Unspecified contact dermatitis due to other agents: Secondary | ICD-10-CM | POA: Diagnosis not present

## 2018-05-01 DIAGNOSIS — F0390 Unspecified dementia without behavioral disturbance: Secondary | ICD-10-CM | POA: Diagnosis not present

## 2018-05-01 DIAGNOSIS — I739 Peripheral vascular disease, unspecified: Secondary | ICD-10-CM | POA: Diagnosis not present

## 2018-05-01 DIAGNOSIS — N189 Chronic kidney disease, unspecified: Secondary | ICD-10-CM | POA: Diagnosis not present

## 2018-05-01 NOTE — Telephone Encounter (Signed)
Son, Gregary SignsSean dropped off FMLA Paperwork for his work, [employer],  to care for his mother. Placed Paperwork in Dr. Celene Skeenarter's folder to review and sign. Printed last OV note and attached.   To call son once ready #[phone]

## 2018-05-04 DIAGNOSIS — I739 Peripheral vascular disease, unspecified: Secondary | ICD-10-CM | POA: Diagnosis not present

## 2018-05-04 DIAGNOSIS — H548 Legal blindness, as defined in USA: Secondary | ICD-10-CM | POA: Diagnosis not present

## 2018-05-04 DIAGNOSIS — D649 Anemia, unspecified: Secondary | ICD-10-CM | POA: Diagnosis not present

## 2018-05-04 DIAGNOSIS — N189 Chronic kidney disease, unspecified: Secondary | ICD-10-CM | POA: Diagnosis not present

## 2018-05-04 DIAGNOSIS — I129 Hypertensive chronic kidney disease with stage 1 through stage 4 chronic kidney disease, or unspecified chronic kidney disease: Secondary | ICD-10-CM | POA: Diagnosis not present

## 2018-05-04 DIAGNOSIS — D692 Other nonthrombocytopenic purpura: Secondary | ICD-10-CM | POA: Diagnosis not present

## 2018-05-04 DIAGNOSIS — E119 Type 2 diabetes mellitus without complications: Secondary | ICD-10-CM | POA: Diagnosis not present

## 2018-05-04 DIAGNOSIS — L258 Unspecified contact dermatitis due to other agents: Secondary | ICD-10-CM | POA: Diagnosis not present

## 2018-05-04 DIAGNOSIS — F0390 Unspecified dementia without behavioral disturbance: Secondary | ICD-10-CM | POA: Diagnosis not present

## 2018-05-05 ENCOUNTER — Ambulatory Visit: Payer: Medicare HMO | Admitting: Neurology

## 2018-05-06 DIAGNOSIS — N189 Chronic kidney disease, unspecified: Secondary | ICD-10-CM | POA: Diagnosis not present

## 2018-05-06 DIAGNOSIS — I739 Peripheral vascular disease, unspecified: Secondary | ICD-10-CM | POA: Diagnosis not present

## 2018-05-06 DIAGNOSIS — D649 Anemia, unspecified: Secondary | ICD-10-CM | POA: Diagnosis not present

## 2018-05-06 DIAGNOSIS — E119 Type 2 diabetes mellitus without complications: Secondary | ICD-10-CM | POA: Diagnosis not present

## 2018-05-06 DIAGNOSIS — I129 Hypertensive chronic kidney disease with stage 1 through stage 4 chronic kidney disease, or unspecified chronic kidney disease: Secondary | ICD-10-CM | POA: Diagnosis not present

## 2018-05-06 DIAGNOSIS — L258 Unspecified contact dermatitis due to other agents: Secondary | ICD-10-CM | POA: Diagnosis not present

## 2018-05-06 DIAGNOSIS — D692 Other nonthrombocytopenic purpura: Secondary | ICD-10-CM | POA: Diagnosis not present

## 2018-05-06 DIAGNOSIS — H548 Legal blindness, as defined in USA: Secondary | ICD-10-CM | POA: Diagnosis not present

## 2018-05-06 DIAGNOSIS — F0390 Unspecified dementia without behavioral disturbance: Secondary | ICD-10-CM | POA: Diagnosis not present

## 2018-05-08 DIAGNOSIS — F0390 Unspecified dementia without behavioral disturbance: Secondary | ICD-10-CM | POA: Diagnosis not present

## 2018-05-08 DIAGNOSIS — H548 Legal blindness, as defined in USA: Secondary | ICD-10-CM | POA: Diagnosis not present

## 2018-05-08 DIAGNOSIS — N189 Chronic kidney disease, unspecified: Secondary | ICD-10-CM | POA: Diagnosis not present

## 2018-05-08 DIAGNOSIS — E119 Type 2 diabetes mellitus without complications: Secondary | ICD-10-CM | POA: Diagnosis not present

## 2018-05-08 DIAGNOSIS — D649 Anemia, unspecified: Secondary | ICD-10-CM | POA: Diagnosis not present

## 2018-05-08 DIAGNOSIS — D692 Other nonthrombocytopenic purpura: Secondary | ICD-10-CM | POA: Diagnosis not present

## 2018-05-08 DIAGNOSIS — M545 Low back pain: Secondary | ICD-10-CM | POA: Diagnosis not present

## 2018-05-08 DIAGNOSIS — I129 Hypertensive chronic kidney disease with stage 1 through stage 4 chronic kidney disease, or unspecified chronic kidney disease: Secondary | ICD-10-CM | POA: Diagnosis not present

## 2018-05-08 DIAGNOSIS — R531 Weakness: Secondary | ICD-10-CM | POA: Diagnosis not present

## 2018-05-08 DIAGNOSIS — I739 Peripheral vascular disease, unspecified: Secondary | ICD-10-CM | POA: Diagnosis not present

## 2018-05-08 DIAGNOSIS — L258 Unspecified contact dermatitis due to other agents: Secondary | ICD-10-CM | POA: Diagnosis not present

## 2018-05-08 NOTE — Telephone Encounter (Signed)
Patient son,Sean notified that paperwork was ready to be picked up and would be left up front.   Copy sent for scanning.

## 2018-05-11 DIAGNOSIS — N189 Chronic kidney disease, unspecified: Secondary | ICD-10-CM | POA: Diagnosis not present

## 2018-05-11 DIAGNOSIS — E119 Type 2 diabetes mellitus without complications: Secondary | ICD-10-CM | POA: Diagnosis not present

## 2018-05-11 DIAGNOSIS — I739 Peripheral vascular disease, unspecified: Secondary | ICD-10-CM | POA: Diagnosis not present

## 2018-05-11 DIAGNOSIS — H548 Legal blindness, as defined in USA: Secondary | ICD-10-CM | POA: Diagnosis not present

## 2018-05-11 DIAGNOSIS — L258 Unspecified contact dermatitis due to other agents: Secondary | ICD-10-CM | POA: Diagnosis not present

## 2018-05-11 DIAGNOSIS — D692 Other nonthrombocytopenic purpura: Secondary | ICD-10-CM | POA: Diagnosis not present

## 2018-05-11 DIAGNOSIS — D649 Anemia, unspecified: Secondary | ICD-10-CM | POA: Diagnosis not present

## 2018-05-11 DIAGNOSIS — I129 Hypertensive chronic kidney disease with stage 1 through stage 4 chronic kidney disease, or unspecified chronic kidney disease: Secondary | ICD-10-CM | POA: Diagnosis not present

## 2018-05-11 DIAGNOSIS — Z029 Encounter for administrative examinations, unspecified: Secondary | ICD-10-CM

## 2018-05-11 DIAGNOSIS — F0390 Unspecified dementia without behavioral disturbance: Secondary | ICD-10-CM | POA: Diagnosis not present

## 2018-05-12 DIAGNOSIS — D692 Other nonthrombocytopenic purpura: Secondary | ICD-10-CM | POA: Diagnosis not present

## 2018-05-12 DIAGNOSIS — L258 Unspecified contact dermatitis due to other agents: Secondary | ICD-10-CM | POA: Diagnosis not present

## 2018-05-12 DIAGNOSIS — F0390 Unspecified dementia without behavioral disturbance: Secondary | ICD-10-CM | POA: Diagnosis not present

## 2018-05-12 DIAGNOSIS — N189 Chronic kidney disease, unspecified: Secondary | ICD-10-CM | POA: Diagnosis not present

## 2018-05-12 DIAGNOSIS — E119 Type 2 diabetes mellitus without complications: Secondary | ICD-10-CM | POA: Diagnosis not present

## 2018-05-12 DIAGNOSIS — I129 Hypertensive chronic kidney disease with stage 1 through stage 4 chronic kidney disease, or unspecified chronic kidney disease: Secondary | ICD-10-CM | POA: Diagnosis not present

## 2018-05-12 DIAGNOSIS — D649 Anemia, unspecified: Secondary | ICD-10-CM | POA: Diagnosis not present

## 2018-05-12 DIAGNOSIS — I739 Peripheral vascular disease, unspecified: Secondary | ICD-10-CM | POA: Diagnosis not present

## 2018-05-12 DIAGNOSIS — H548 Legal blindness, as defined in USA: Secondary | ICD-10-CM | POA: Diagnosis not present

## 2018-05-14 ENCOUNTER — Telehealth: Payer: Self-pay | Admitting: Neurology

## 2018-05-14 DIAGNOSIS — L258 Unspecified contact dermatitis due to other agents: Secondary | ICD-10-CM | POA: Diagnosis not present

## 2018-05-14 DIAGNOSIS — N189 Chronic kidney disease, unspecified: Secondary | ICD-10-CM | POA: Diagnosis not present

## 2018-05-14 DIAGNOSIS — F0390 Unspecified dementia without behavioral disturbance: Secondary | ICD-10-CM | POA: Diagnosis not present

## 2018-05-14 DIAGNOSIS — E119 Type 2 diabetes mellitus without complications: Secondary | ICD-10-CM | POA: Diagnosis not present

## 2018-05-14 DIAGNOSIS — H548 Legal blindness, as defined in USA: Secondary | ICD-10-CM | POA: Diagnosis not present

## 2018-05-14 DIAGNOSIS — D692 Other nonthrombocytopenic purpura: Secondary | ICD-10-CM | POA: Diagnosis not present

## 2018-05-14 DIAGNOSIS — I129 Hypertensive chronic kidney disease with stage 1 through stage 4 chronic kidney disease, or unspecified chronic kidney disease: Secondary | ICD-10-CM | POA: Diagnosis not present

## 2018-05-14 DIAGNOSIS — D649 Anemia, unspecified: Secondary | ICD-10-CM | POA: Diagnosis not present

## 2018-05-14 DIAGNOSIS — I739 Peripheral vascular disease, unspecified: Secondary | ICD-10-CM | POA: Diagnosis not present

## 2018-05-14 NOTE — Telephone Encounter (Signed)
Patient's daughter is calling in wanting to check the status of a referral that was supposed to be sent out to a psychiatrist. She has not heard anything about an appointment. Please call her back at [phone]. Thanks!

## 2018-05-15 ENCOUNTER — Other Ambulatory Visit: Payer: Self-pay

## 2018-05-15 DIAGNOSIS — E119 Type 2 diabetes mellitus without complications: Secondary | ICD-10-CM | POA: Diagnosis not present

## 2018-05-15 DIAGNOSIS — D649 Anemia, unspecified: Secondary | ICD-10-CM | POA: Diagnosis not present

## 2018-05-15 DIAGNOSIS — L258 Unspecified contact dermatitis due to other agents: Secondary | ICD-10-CM | POA: Diagnosis not present

## 2018-05-15 DIAGNOSIS — I129 Hypertensive chronic kidney disease with stage 1 through stage 4 chronic kidney disease, or unspecified chronic kidney disease: Secondary | ICD-10-CM | POA: Diagnosis not present

## 2018-05-15 DIAGNOSIS — N189 Chronic kidney disease, unspecified: Secondary | ICD-10-CM | POA: Diagnosis not present

## 2018-05-15 DIAGNOSIS — D692 Other nonthrombocytopenic purpura: Secondary | ICD-10-CM | POA: Diagnosis not present

## 2018-05-15 DIAGNOSIS — F0390 Unspecified dementia without behavioral disturbance: Secondary | ICD-10-CM | POA: Diagnosis not present

## 2018-05-15 DIAGNOSIS — R441 Visual hallucinations: Secondary | ICD-10-CM

## 2018-05-15 DIAGNOSIS — H548 Legal blindness, as defined in USA: Secondary | ICD-10-CM | POA: Diagnosis not present

## 2018-05-15 DIAGNOSIS — I739 Peripheral vascular disease, unspecified: Secondary | ICD-10-CM | POA: Diagnosis not present

## 2018-05-15 NOTE — Telephone Encounter (Signed)
Spoke with pt's daughter.  She states that she dropped the ball on getting pt into [facility] but would like to get that going again.  Order/referral placed in Epic, telephone number to [facility] given to pt's daughter.  Asked that she wait until next week to call and schedule.  Pt's daughter verbalized understanding.

## 2018-05-18 DIAGNOSIS — N189 Chronic kidney disease, unspecified: Secondary | ICD-10-CM | POA: Diagnosis not present

## 2018-05-18 DIAGNOSIS — F0390 Unspecified dementia without behavioral disturbance: Secondary | ICD-10-CM | POA: Diagnosis not present

## 2018-05-18 DIAGNOSIS — D692 Other nonthrombocytopenic purpura: Secondary | ICD-10-CM | POA: Diagnosis not present

## 2018-05-18 DIAGNOSIS — L258 Unspecified contact dermatitis due to other agents: Secondary | ICD-10-CM | POA: Diagnosis not present

## 2018-05-18 DIAGNOSIS — I739 Peripheral vascular disease, unspecified: Secondary | ICD-10-CM | POA: Diagnosis not present

## 2018-05-18 DIAGNOSIS — D649 Anemia, unspecified: Secondary | ICD-10-CM | POA: Diagnosis not present

## 2018-05-18 DIAGNOSIS — E119 Type 2 diabetes mellitus without complications: Secondary | ICD-10-CM | POA: Diagnosis not present

## 2018-05-18 DIAGNOSIS — I129 Hypertensive chronic kidney disease with stage 1 through stage 4 chronic kidney disease, or unspecified chronic kidney disease: Secondary | ICD-10-CM | POA: Diagnosis not present

## 2018-05-18 DIAGNOSIS — H548 Legal blindness, as defined in USA: Secondary | ICD-10-CM | POA: Diagnosis not present

## 2018-05-19 ENCOUNTER — Other Ambulatory Visit: Payer: Self-pay | Admitting: Nurse Practitioner

## 2018-05-19 DIAGNOSIS — D692 Other nonthrombocytopenic purpura: Secondary | ICD-10-CM | POA: Diagnosis not present

## 2018-05-19 DIAGNOSIS — L258 Unspecified contact dermatitis due to other agents: Secondary | ICD-10-CM | POA: Diagnosis not present

## 2018-05-19 DIAGNOSIS — I129 Hypertensive chronic kidney disease with stage 1 through stage 4 chronic kidney disease, or unspecified chronic kidney disease: Secondary | ICD-10-CM | POA: Diagnosis not present

## 2018-05-19 DIAGNOSIS — N189 Chronic kidney disease, unspecified: Secondary | ICD-10-CM | POA: Diagnosis not present

## 2018-05-19 DIAGNOSIS — F0390 Unspecified dementia without behavioral disturbance: Secondary | ICD-10-CM | POA: Diagnosis not present

## 2018-05-19 DIAGNOSIS — I739 Peripheral vascular disease, unspecified: Secondary | ICD-10-CM | POA: Diagnosis not present

## 2018-05-19 DIAGNOSIS — D649 Anemia, unspecified: Secondary | ICD-10-CM | POA: Diagnosis not present

## 2018-05-19 DIAGNOSIS — H548 Legal blindness, as defined in USA: Secondary | ICD-10-CM | POA: Diagnosis not present

## 2018-05-19 DIAGNOSIS — E119 Type 2 diabetes mellitus without complications: Secondary | ICD-10-CM | POA: Diagnosis not present

## 2018-05-21 ENCOUNTER — Other Ambulatory Visit: Payer: Self-pay

## 2018-05-21 DIAGNOSIS — E1140 Type 2 diabetes mellitus with diabetic neuropathy, unspecified: Secondary | ICD-10-CM

## 2018-05-21 DIAGNOSIS — Z794 Long term (current) use of insulin: Secondary | ICD-10-CM

## 2018-05-21 DIAGNOSIS — E785 Hyperlipidemia, unspecified: Secondary | ICD-10-CM

## 2018-05-21 DIAGNOSIS — E0822 Diabetes mellitus due to underlying condition with diabetic chronic kidney disease: Secondary | ICD-10-CM

## 2018-05-21 DIAGNOSIS — N183 Chronic kidney disease, stage 3 (moderate): Secondary | ICD-10-CM

## 2018-05-21 DIAGNOSIS — I10 Essential (primary) hypertension: Secondary | ICD-10-CM

## 2018-05-22 ENCOUNTER — Other Ambulatory Visit: Payer: Medicare HMO

## 2018-05-25 ENCOUNTER — Other Ambulatory Visit: Payer: Medicare HMO

## 2018-05-25 DIAGNOSIS — E785 Hyperlipidemia, unspecified: Secondary | ICD-10-CM | POA: Diagnosis not present

## 2018-05-25 DIAGNOSIS — N183 Chronic kidney disease, stage 3 (moderate): Secondary | ICD-10-CM | POA: Diagnosis not present

## 2018-05-25 DIAGNOSIS — Z794 Long term (current) use of insulin: Secondary | ICD-10-CM | POA: Diagnosis not present

## 2018-05-25 DIAGNOSIS — E0822 Diabetes mellitus due to underlying condition with diabetic chronic kidney disease: Secondary | ICD-10-CM | POA: Diagnosis not present

## 2018-05-25 DIAGNOSIS — I10 Essential (primary) hypertension: Secondary | ICD-10-CM | POA: Diagnosis not present

## 2018-05-25 DIAGNOSIS — E1140 Type 2 diabetes mellitus with diabetic neuropathy, unspecified: Secondary | ICD-10-CM | POA: Diagnosis not present

## 2018-05-26 DIAGNOSIS — E119 Type 2 diabetes mellitus without complications: Secondary | ICD-10-CM | POA: Diagnosis not present

## 2018-05-26 DIAGNOSIS — N189 Chronic kidney disease, unspecified: Secondary | ICD-10-CM | POA: Diagnosis not present

## 2018-05-26 DIAGNOSIS — L258 Unspecified contact dermatitis due to other agents: Secondary | ICD-10-CM | POA: Diagnosis not present

## 2018-05-26 DIAGNOSIS — I739 Peripheral vascular disease, unspecified: Secondary | ICD-10-CM | POA: Diagnosis not present

## 2018-05-26 DIAGNOSIS — D692 Other nonthrombocytopenic purpura: Secondary | ICD-10-CM | POA: Diagnosis not present

## 2018-05-26 DIAGNOSIS — F0390 Unspecified dementia without behavioral disturbance: Secondary | ICD-10-CM | POA: Diagnosis not present

## 2018-05-26 DIAGNOSIS — H548 Legal blindness, as defined in USA: Secondary | ICD-10-CM | POA: Diagnosis not present

## 2018-05-26 DIAGNOSIS — I129 Hypertensive chronic kidney disease with stage 1 through stage 4 chronic kidney disease, or unspecified chronic kidney disease: Secondary | ICD-10-CM | POA: Diagnosis not present

## 2018-05-26 DIAGNOSIS — D649 Anemia, unspecified: Secondary | ICD-10-CM | POA: Diagnosis not present

## 2018-05-26 LAB — COMPLETE METABOLIC PANEL WITH GFR
AG RATIO: 1.3 (calc) (ref 1.0–2.5)
ALKALINE PHOSPHATASE (APISO): 61 U/L (ref 33–130)
ALT: 12 U/L (ref 6–29)
AST: 14 U/L (ref 10–35)
Albumin: 3.4 g/dL — ABNORMAL LOW (ref 3.6–5.1)
BILIRUBIN TOTAL: 0.3 mg/dL (ref 0.2–1.2)
BUN / CREAT RATIO: 17 (calc) (ref 6–22)
BUN: 20 mg/dL (ref 7–25)
CALCIUM: 10 mg/dL (ref 8.6–10.4)
CHLORIDE: 111 mmol/L — AB (ref 98–110)
CO2: 29 mmol/L (ref 20–32)
Creat: 1.21 mg/dL — ABNORMAL HIGH (ref 0.60–0.93)
GFR, EST AFRICAN AMERICAN: 51 mL/min/{1.73_m2} — AB (ref >=60)
GFR, Est Non African American: 44 mL/min/{1.73_m2} — ABNORMAL LOW (ref >=60)
GLUCOSE: 100 mg/dL — AB (ref 65–99)
Globulin: 2.6 g/dL (calc) (ref 1.9–3.7)
POTASSIUM: 5.1 mmol/L (ref 3.5–5.3)
Sodium: 146 mmol/L (ref 135–146)
Total Protein: 6 g/dL — ABNORMAL LOW (ref 6.1–8.1)

## 2018-05-26 LAB — HEMOGLOBIN A1C
Hgb A1c MFr Bld: 7.9 % of total Hgb — ABNORMAL HIGH (ref <5.7)
Mean Plasma Glucose: 180 (calc)
eAG (mmol/L): 10 (calc)

## 2018-05-26 LAB — LIPID PANEL
CHOL/HDL RATIO: 2 (calc) (ref <5.0)
CHOLESTEROL: 117 mg/dL (ref <200)
HDL: 59 mg/dL (ref >50)
LDL CHOLESTEROL (CALC): 45 mg/dL
Non-HDL Cholesterol (Calc): 58 mg/dL (calc) (ref <130)
Triglycerides: 44 mg/dL (ref <150)

## 2018-05-28 ENCOUNTER — Encounter: Payer: Self-pay | Admitting: Nurse Practitioner

## 2018-05-28 ENCOUNTER — Ambulatory Visit (INDEPENDENT_AMBULATORY_CARE_PROVIDER_SITE_OTHER): Payer: Medicare HMO | Admitting: Nurse Practitioner

## 2018-05-28 VITALS — BP 122/70 | HR 64 | Temp 98.5°F | Ht 66.0 in | Wt 267.0 lb

## 2018-05-28 DIAGNOSIS — I129 Hypertensive chronic kidney disease with stage 1 through stage 4 chronic kidney disease, or unspecified chronic kidney disease: Secondary | ICD-10-CM | POA: Diagnosis not present

## 2018-05-28 DIAGNOSIS — E0822 Diabetes mellitus due to underlying condition with diabetic chronic kidney disease: Secondary | ICD-10-CM

## 2018-05-28 DIAGNOSIS — I10 Essential (primary) hypertension: Secondary | ICD-10-CM

## 2018-05-28 DIAGNOSIS — H548 Legal blindness, as defined in USA: Secondary | ICD-10-CM | POA: Diagnosis not present

## 2018-05-28 DIAGNOSIS — E785 Hyperlipidemia, unspecified: Secondary | ICD-10-CM

## 2018-05-28 DIAGNOSIS — N183 Chronic kidney disease, stage 3 (moderate): Secondary | ICD-10-CM | POA: Diagnosis not present

## 2018-05-28 DIAGNOSIS — R443 Hallucinations, unspecified: Secondary | ICD-10-CM | POA: Diagnosis not present

## 2018-05-28 DIAGNOSIS — N189 Chronic kidney disease, unspecified: Secondary | ICD-10-CM | POA: Diagnosis not present

## 2018-05-28 DIAGNOSIS — G629 Polyneuropathy, unspecified: Secondary | ICD-10-CM | POA: Diagnosis not present

## 2018-05-28 DIAGNOSIS — E1140 Type 2 diabetes mellitus with diabetic neuropathy, unspecified: Secondary | ICD-10-CM

## 2018-05-28 DIAGNOSIS — Z23 Encounter for immunization: Secondary | ICD-10-CM | POA: Diagnosis not present

## 2018-05-28 DIAGNOSIS — F0390 Unspecified dementia without behavioral disturbance: Secondary | ICD-10-CM | POA: Diagnosis not present

## 2018-05-28 DIAGNOSIS — D649 Anemia, unspecified: Secondary | ICD-10-CM | POA: Diagnosis not present

## 2018-05-28 DIAGNOSIS — Z794 Long term (current) use of insulin: Secondary | ICD-10-CM | POA: Diagnosis not present

## 2018-05-28 DIAGNOSIS — L258 Unspecified contact dermatitis due to other agents: Secondary | ICD-10-CM | POA: Diagnosis not present

## 2018-05-28 DIAGNOSIS — I739 Peripheral vascular disease, unspecified: Secondary | ICD-10-CM | POA: Diagnosis not present

## 2018-05-28 DIAGNOSIS — D692 Other nonthrombocytopenic purpura: Secondary | ICD-10-CM | POA: Diagnosis not present

## 2018-05-28 DIAGNOSIS — E119 Type 2 diabetes mellitus without complications: Secondary | ICD-10-CM | POA: Diagnosis not present

## 2018-05-28 MED ORDER — ZOSTER VAC RECOMB ADJUVANTED 50 MCG/0.5ML IM SUSR: 0.5000 mL | Freq: Once | INTRAMUSCULAR | 1 refills | Status: DC

## 2018-05-28 MED ORDER — CILOSTAZOL 100 MG PO TABS: 50.0000 mg | ORAL_TABLET | Freq: Every morning | ORAL | 1 refills | Status: DC

## 2018-05-28 MED ORDER — OLMESARTAN MEDOXOMIL 40 MG PO TABS: 40.0000 mg | ORAL_TABLET | Freq: Every day | ORAL | 1 refills | Status: DC

## 2018-05-28 MED ORDER — AMLODIPINE BESYLATE 10 MG PO TABS: 10.0000 mg | ORAL_TABLET | Freq: Every day | ORAL | 1 refills | Status: DC

## 2018-05-28 MED ORDER — ATORVASTATIN CALCIUM 20 MG PO TABS: 20.0000 mg | ORAL_TABLET | Freq: Every day | ORAL | 1 refills | Status: DC

## 2018-05-28 MED ORDER — TETANUS-DIPHTH-ACELL PERTUSSIS 5-2.5-18.5 LF-MCG/0.5 IM SUSP: 0.5000 mL | Freq: Once | INTRAMUSCULAR | 0 refills | Status: AC

## 2018-05-28 MED ORDER — ZOSTER VAC RECOMB ADJUVANTED 50 MCG/0.5ML IM SUSR: 0.5000 mL | Freq: Once | INTRAMUSCULAR | 1 refills | Status: AC

## 2018-05-28 MED ORDER — GLUCOSE BLOOD VI STRP: ORAL_STRIP | 3 refills | Status: DC

## 2018-05-28 MED ORDER — METFORMIN HCL 1000 MG PO TABS: 1000.0000 mg | ORAL_TABLET | Freq: Every day | ORAL | 1 refills | Status: DC

## 2018-05-28 MED ORDER — QUETIAPINE FUMARATE 25 MG PO TABS: 25.0000 mg | ORAL_TABLET | Freq: Two times a day (BID) | ORAL | 2 refills | Status: DC

## 2018-05-28 MED ORDER — TORSEMIDE 20 MG PO TABS: 10.0000 mg | ORAL_TABLET | Freq: Every morning | ORAL | 1 refills | Status: DC

## 2018-05-28 NOTE — Progress Notes (Signed)
Careteam: Patient Care Team: Lauree Chandler, NP as PCP - General (Geriatric Medicine) Cameron Sprang, MD as Consulting Physician (Neurology)  Advanced Directive information    Allergies  Allergen Reactions  . Oxycodone Other (See Comments)    "I'm swimming; I'm floating; I'm diving; I'm in another world."    Chief Complaint  Patient presents with  . Medical Management of Chronic Issues    2 month follow-up and discuss labs (copy printed), + fall rish, currently getting PT/OT with [facility]   . Immunizations    Flu Vacine today   . Health Maintenance    Discuss Eye Exam Recommendation- patient is blind      HPI: Patient is a 72 y.o. female seen in the office today for routine follow up. A very rough month from August-September.   Neuropathy- unchanged on lyrica 61m BID but still taking - she has not noticed any difference in pain level. She tried gabapentin in the past also. Declines surgical intervention for carpal tunnel syndrome. Has not gotten the wrist braces. Tramadol did not work.   DM- currently taking lantus 40 units qhs and metformin 1000 mg daily (due to CKD). Did not bring meter today. Fasting blood sugars 60-90, evening blood sugars 160-210. Has evening snack and sometimes will eat this early vs waiting which could be contributing to lower morning blood pressure. Blood sugar rarely in the 60s  Psych disorder- recommended to see psychiatrist hallucinations- she has not gone yet.  Previously on seroquel then switched to zyprexa; recommended to restarted zyprexa 5 mg qhs and she started taking but episodes increased therefore zyprexa was stopped and now better off medication. Daughter felt like overall the seroquel did better. She was taking 100 mg by mouth twice daily. Pt reports she is still having hallucination but not going into full blow episodes per daughter.    GERD- using nexium with good results.     Review of Systems:  Review of Systems    Unable to perform ROS: Psychiatric disorder    Past Medical History:  Diagnosis Date  . Anemia of chronic disease   . Arthritis   . Back pain, chronic   . Blind   . Candida infection of mouth   . Diabetes mellitus   . Esophageal reflux   . GERD (gastroesophageal reflux disease)   . Hallucination   . Hand pain, left   . Hyperlipidemia   . Hypertension   . Hypertensive renal disease   . Insomnia   . Morbid obesity ([place]   . OAB (overactive bladder)   . Osteoarthritis    left shoulder  . Poor circulation   . PVD (peripheral vascular disease) ([place]   . Retinal detachment 11/2012  . Senile dementia with delusional features ([place]   . Senile purpura ([place]   . Vitamin D deficiency    Past Surgical History:  Procedure Laterality Date  . CATARACT EXTRACTION, BILATERAL    . INCISION AND DRAINAGE PERIRECTAL ABSCESS  11/20/2011   Procedure: IRRIGATION AND DEBRIDEMENT PERIRECTAL ABSCESS;  Surgeon: BZenovia Jarred MD;  [place]  Service: General;  Laterality: Right;  Incision and Drainage of Right Groin Abscess   Social History:   reports that she quit smoking about 41 years ago. Her smoking use included cigarettes. She has a 3.75 pack-year smoking history. She has never used smokeless tobacco. She reports that she does not drink alcohol or use drugs.  Family History  Problem Relation Age of  Onset  . Heart disease Mother   . Diabetes Mother   . Hyperlipidemia Son   . Hypertension Son     Medications: Patient's Medications  New Prescriptions   No medications on file  Previous Medications   ACCU-CHEK FASTCLIX LANCETS MISC    by Does not apply route. Check blood sugar 2 times daily   AMLODIPINE (NORVASC) 10 MG TABLET    Take 10 mg by mouth daily.    ASPIRIN EC 81 MG TABLET    Take 81 mg by mouth daily.   ATORVASTATIN (LIPITOR) 20 MG TABLET    Take 1 tablet (20 mg total) by mouth daily at 6 PM.   BLOOD GLUCOSE MONITORING SUPPL (ACCU-CHEK AVIVA PLUS) W/DEVICE KIT    Use  to test Blood sugar three times daily. Dx: E11.40   CALCIUM PO    Take 1,000 mg by mouth 2 (two) times daily.    CILOSTAZOL (PLETAL) 100 MG TABLET    Take 0.5 tablets (50 mg total) by mouth every morning.   DOCUSATE SODIUM (COLACE) 100 MG CAPSULE    Take 100 mg by mouth 2 (two) times daily. Stool softener   ELASTIC BANDAGES & SUPPORTS (CARPAL TUNNEL WRIST STABILIZER) MISC    Apply to left wrist during the day, remove at night   ESOMEPRAZOLE (NEXIUM) 20 MG CAPSULE    Take 20 mg by mouth every morning.   GLUCOSE BLOOD TEST STRIP    Use to test blood sugar three times daily. Dx: E11.40   INSULIN GLARGINE (LANTUS) 100 UNIT/ML INJECTION    Inject 0.4 mLs (40 Units total) into the skin at bedtime. Dx: E11.40   INSULIN SYRINGE-NEEDLE U-100 (INSULIN SYRINGE 1CC/31GX5/16") 31G X 5/16" 1 ML MISC    1 Syringe by Does not apply route at bedtime. Dx: E11.40   IRON PO    Take 1 tablet by mouth every evening.    LABETALOL (NORMODYNE) 100 MG TABLET    Take 1 tablet (100 mg total) by mouth 2 (two) times daily.   METFORMIN (GLUCOPHAGE) 1000 MG TABLET    Take 1 tablet (1,000 mg total) by mouth daily with breakfast.   OLMESARTAN (BENICAR) 40 MG TABLET    Take 1 tablet (40 mg total) by mouth daily.   PREGABALIN (LYRICA) 50 MG CAPSULE    Take 1 capsule (50 mg total) by mouth 2 (two) times daily.   TORSEMIDE (DEMADEX) 20 MG TABLET    Take 0.5 tablets (10 mg total) by mouth every morning.   TRAMADOL (ULTRAM) 50 MG TABLET    Take 50 mg by mouth 2 (two) times daily as needed.  Modified Medications   No medications on file  Discontinued Medications   ACCU-CHEK FASTCLIX LANCETS MISC    USE TO CHECK BLOOD SUGARS THREE TIMES DAILY   QUETIAPINE (SEROQUEL) 50 MG TABLET    TAKE 1 TABLET BY MOUTH EVERY MORNING AND 2 TABLETS EVERY NIGHT AT BEDTIME   TRAMADOL (ULTRAM) 50 MG TABLET    Take 1 tablet (50 mg total) by mouth 2 (two) times daily. For pain   VALSARTAN (DIOVAN) 320 MG TABLET    Take 320 mg by mouth daily.       Physical Exam:  Vitals:   05/28/18 0902  BP: 122/70  Pulse: 64  Temp: 98.5 F (36.9 C)  TempSrc: Oral  SpO2: 95%  Weight: 267 lb (121.1 kg)  Height: '5\' 6"'  (1.676 m)   Body mass index is 43.09 kg/m.  Physical Exam  Constitutional: She appears well-developed and well-nourished.  HENT:  Mouth/Throat: Oropharynx is clear and moist. No oropharyngeal exudate.  MMM; no oral thrush  Eyes: Pupils are equal, round, and reactive to light. No scleral icterus.  Right corneal clouding; blind in OU  Neck: Neck supple. Carotid bruit is not present. No tracheal deviation present. No thyromegaly present.  Cardiovascular: Normal rate, regular rhythm and intact distal pulses. Exam reveals no gallop and no friction rub.  Murmur (1/6 SEM) heard. Pulmonary/Chest: Effort normal and breath sounds normal. No stridor. No respiratory distress. She has no wheezes. She has no rales.  Abdominal: Soft. Normal appearance and bowel sounds are normal. She exhibits no distension and no mass. There is no hepatomegaly. There is no tenderness. There is no rigidity, no rebound and no guarding. No hernia.  Musculoskeletal: She exhibits no edema.  Lymphadenopathy:    She has no cervical adenopathy.  Neurological: She is alert. She has normal reflexes.  Skin: Skin is warm and dry. No rash noted.  Psychiatric: She has a normal mood and affect. Her behavior is normal. Judgment and thought content normal.    Labs reviewed: Basic Metabolic Panel: Recent Labs    10/16/17 1007 01/16/18 0942 04/16/18 1157 05/25/18 0852  NA 144 142 143 146  K 4.1 4.9 4.4 5.1  CL 108 107 108 111*  CO2 '30 30 26 29  ' GLUCOSE 123* 223* 303* 100*  BUN '19 23 22 20  ' CREATININE 1.25* 1.41* 1.49* 1.21*  CALCIUM 10.0 10.0 10.3 10.0  TSH 2.00  --   --   --    Liver Function Tests: Recent Labs    08/28/17 10/16/17 1007 04/16/18 1157 05/25/18 0852  AST 10* 12 45* 14  ALT '9 11 28 12  ' ALKPHOS 66  --  50  --   BILITOT  --  0.4  0.5 0.3  PROT  --  6.0* 6.0* 6.0*  ALBUMIN  --   --  3.1*  --    No results for input(s): LIPASE, AMYLASE in the last 8760 hours. No results for input(s): AMMONIA in the last 8760 hours. CBC: Recent Labs    08/28/17 10/16/17 1007 04/16/18 1157  WBC 4.4 4.1 5.3  NEUTROABS  --  1,935 3.5  HGB 9.5* 10.1* 10.8*  HCT 32* 30.7* 34.3*  MCV  --  89.8 94.0  PLT 191 212 208   Lipid Panel: Recent Labs    08/28/17 10/16/17 1007 05/25/18 0852  CHOL 128 121 117  HDL 65 59 59  LDLCALC 54 46 45  TRIG 46 78 44  CHOLHDL  --  2.1 2.0   TSH: Recent Labs    10/16/17 1007  TSH 2.00   A1C: Lab Results  Component Value Date   HGBA1C 7.9 (H) 05/25/2018     Assessment/Plan 1. Type 2 diabetes mellitus with diabetic neuropathy, with long-term current use of insulin (HCC) - Hemoglobin A1c; Future  2. Neuropathy -ongoing, lyrica not effective. Will dc at this time. Encouraged to get bilateral wrist braces  3. Need for influenza vaccination - Flu vaccine HIGH DOSE PF (Fluzone High dose)  4. Diabetes mellitus due to underlying condition with stage 3 chronic kidney disease, with long-term current use of insulin (HCC) -improved blood sugars, will continue current regimen. Encouraged compliance with evening snack to avoid morning lows. To notify for hypoglycemia.  - glucose blood test strip; Use to test blood sugar three times daily. Dx: E11.40  Dispense: 100 each; Refill: 3 - metFORMIN (GLUCOPHAGE)  1000 MG tablet; Take 1 tablet (1,000 mg total) by mouth daily with breakfast.  Dispense: 90 tablet; Refill: 1  5. Hyperlipidemia, unspecified hyperlipidemia type LDL at goal on current regimen, to continue dietary modifications.  - atorvastatin (LIPITOR) 20 MG tablet; Take 1 tablet (20 mg total) by mouth daily at 6 PM.  Dispense: 90 tablet; Refill: 1  6. Hallucination Hallucinations with episodes that cause her to fall out of bed or onto the floor. Worsening episodes with zyprexa vs seorquel.  Will restart Seroquel. Discussed black box warnings with daughter and at this time would like to restart despite risk. Will start low dose and if behaviors worsens to stop immediately. Will follow up with psychiatrist for further evaluation and titration.  - QUEtiapine (SEROQUEL) 25 MG tablet; Take 1 tablet (25 mg total) by mouth 2 (two) times daily.  Dispense: 60 tablet; Refill: 2 -also discussed FMLA paperwork for husband who helps out. Told daughter in the future if they wanted to make a separate appt for paperwork this would be okay.   7. Essential hypertension, benign -controlled on current regimen - amLODipine (NORVASC) 10 MG tablet; Take 1 tablet (10 mg total) by mouth daily.  Dispense: 90 tablet; Refill: 1 - olmesartan (BENICAR) 40 MG tablet; Take 1 tablet (40 mg total) by mouth daily.  Dispense: 90 tablet; Refill: 1 - torsemide (DEMADEX) 20 MG tablet; Take 0.5 tablets (10 mg total) by mouth every morning.  Dispense: 45 tablet; Refill: 1 - CBC with Differential/Platelets; Future - BMP with eGFR([facility]); Future  Next appt: 3 months with labs prior to visit.  Carlos American. Anthony, [facility] [phone]

## 2018-05-28 NOTE — Patient Instructions (Addendum)
Decrease lyrica to once daily for a few days then stop due to not being effective.   To start Seroquel 25 mg by mouth twice daily and make appt with psychiatrist if causing increase hallucinations or episodes to STOP immediately and notify    Follow up in 3 months with lab work prior to appt   If needed an make separate appt for paperwork only

## 2018-06-03 DIAGNOSIS — E119 Type 2 diabetes mellitus without complications: Secondary | ICD-10-CM | POA: Diagnosis not present

## 2018-06-03 DIAGNOSIS — D692 Other nonthrombocytopenic purpura: Secondary | ICD-10-CM | POA: Diagnosis not present

## 2018-06-03 DIAGNOSIS — L258 Unspecified contact dermatitis due to other agents: Secondary | ICD-10-CM | POA: Diagnosis not present

## 2018-06-03 DIAGNOSIS — I739 Peripheral vascular disease, unspecified: Secondary | ICD-10-CM | POA: Diagnosis not present

## 2018-06-03 DIAGNOSIS — I129 Hypertensive chronic kidney disease with stage 1 through stage 4 chronic kidney disease, or unspecified chronic kidney disease: Secondary | ICD-10-CM | POA: Diagnosis not present

## 2018-06-03 DIAGNOSIS — D649 Anemia, unspecified: Secondary | ICD-10-CM | POA: Diagnosis not present

## 2018-06-03 DIAGNOSIS — F0390 Unspecified dementia without behavioral disturbance: Secondary | ICD-10-CM | POA: Diagnosis not present

## 2018-06-03 DIAGNOSIS — H548 Legal blindness, as defined in USA: Secondary | ICD-10-CM | POA: Diagnosis not present

## 2018-06-03 DIAGNOSIS — N189 Chronic kidney disease, unspecified: Secondary | ICD-10-CM | POA: Diagnosis not present

## 2018-06-07 DIAGNOSIS — R531 Weakness: Secondary | ICD-10-CM | POA: Diagnosis not present

## 2018-06-07 DIAGNOSIS — M545 Low back pain: Secondary | ICD-10-CM | POA: Diagnosis not present

## 2018-06-07 DIAGNOSIS — F0390 Unspecified dementia without behavioral disturbance: Secondary | ICD-10-CM | POA: Diagnosis not present

## 2018-06-07 DIAGNOSIS — R260 Ataxic gait: Secondary | ICD-10-CM | POA: Diagnosis not present

## 2018-06-10 ENCOUNTER — Telehealth: Payer: Self-pay | Admitting: *Deleted

## 2018-06-10 NOTE — Telephone Encounter (Signed)
Received fax from [facility] needing prior authorization for Quetiapine 25mg . Initiated through [employer]. Went to [facility] for determination.   [id number] Case [id number]  BIN: [id number] [id number]  GRP: [id number] ID: [id number]

## 2018-06-11 NOTE — Telephone Encounter (Signed)
Received fax from [facility] #[phone] and Quetiapine was APPROVED through 08/12/19

## 2018-07-08 DIAGNOSIS — R269 Unspecified abnormalities of gait and mobility: Secondary | ICD-10-CM | POA: Diagnosis not present

## 2018-07-08 DIAGNOSIS — M545 Low back pain: Secondary | ICD-10-CM | POA: Diagnosis not present

## 2018-07-08 DIAGNOSIS — R531 Weakness: Secondary | ICD-10-CM | POA: Diagnosis not present

## 2018-07-08 DIAGNOSIS — F0390 Unspecified dementia without behavioral disturbance: Secondary | ICD-10-CM | POA: Diagnosis not present

## 2018-08-07 DIAGNOSIS — M545 Low back pain: Secondary | ICD-10-CM | POA: Diagnosis not present

## 2018-08-07 DIAGNOSIS — R531 Weakness: Secondary | ICD-10-CM | POA: Diagnosis not present

## 2018-08-07 DIAGNOSIS — R269 Unspecified abnormalities of gait and mobility: Secondary | ICD-10-CM | POA: Diagnosis not present

## 2018-08-07 DIAGNOSIS — F0390 Unspecified dementia without behavioral disturbance: Secondary | ICD-10-CM | POA: Diagnosis not present

## 2018-08-24 ENCOUNTER — Other Ambulatory Visit: Payer: Medicare HMO

## 2018-08-24 DIAGNOSIS — E1140 Type 2 diabetes mellitus with diabetic neuropathy, unspecified: Secondary | ICD-10-CM

## 2018-08-24 DIAGNOSIS — I10 Essential (primary) hypertension: Secondary | ICD-10-CM

## 2018-08-24 DIAGNOSIS — Z794 Long term (current) use of insulin: Secondary | ICD-10-CM

## 2018-08-25 LAB — CBC WITH DIFFERENTIAL/PLATELET
Absolute Monocytes: 262 cells/uL (ref 200–950)
BASOS ABS: 41 {cells}/uL (ref 0–200)
Basophils Relative: 0.9 %
EOS PCT: 3.7 %
Eosinophils Absolute: 170 cells/uL (ref 15–500)
HCT: 32.7 % — ABNORMAL LOW (ref 35.0–45.0)
Hemoglobin: 10.7 g/dL — ABNORMAL LOW (ref 11.7–15.5)
LYMPHS ABS: 2374 {cells}/uL (ref 850–3900)
MCH: 29.5 pg (ref 27.0–33.0)
MCHC: 32.7 g/dL (ref 32.0–36.0)
MCV: 90.1 fL (ref 80.0–100.0)
MONOS PCT: 5.7 %
MPV: 11.7 fL (ref 7.5–12.5)
Neutro Abs: 1753 cells/uL (ref 1500–7800)
Neutrophils Relative %: 38.1 %
PLATELETS: 237 10*3/uL (ref 140–400)
RBC: 3.63 10*6/uL — AB (ref 3.80–5.10)
RDW: 13.6 % (ref 11.0–15.0)
Total Lymphocyte: 51.6 %
WBC: 4.6 10*3/uL (ref 3.8–10.8)

## 2018-08-25 LAB — BASIC METABOLIC PANEL WITH GFR
BUN / CREAT RATIO: 20 (calc) (ref 6–22)
BUN: 28 mg/dL — AB (ref 7–25)
CALCIUM: 10.5 mg/dL — AB (ref 8.6–10.4)
CHLORIDE: 107 mmol/L (ref 98–110)
CO2: 24 mmol/L (ref 20–32)
Creat: 1.38 mg/dL — ABNORMAL HIGH (ref 0.60–0.93)
GFR, Est African American: 44 mL/min/{1.73_m2} — ABNORMAL LOW (ref >=60)
GFR, Est Non African American: 38 mL/min/{1.73_m2} — ABNORMAL LOW (ref >=60)
GLUCOSE: 119 mg/dL — AB (ref 65–99)
POTASSIUM: 4.5 mmol/L (ref 3.5–5.3)
Sodium: 141 mmol/L (ref 135–146)

## 2018-08-25 LAB — HEMOGLOBIN A1C
HEMOGLOBIN A1C: 7.8 %{Hb} — AB (ref <5.7)
Mean Plasma Glucose: 177 (calc)
eAG (mmol/L): 9.8 (calc)

## 2018-08-28 ENCOUNTER — Encounter: Payer: Self-pay | Admitting: Nurse Practitioner

## 2018-08-28 ENCOUNTER — Ambulatory Visit (INDEPENDENT_AMBULATORY_CARE_PROVIDER_SITE_OTHER): Payer: Medicare HMO | Admitting: Nurse Practitioner

## 2018-08-28 VITALS — BP 140/82 | HR 75 | Ht 66.0 in | Wt 260.0 lb

## 2018-08-28 DIAGNOSIS — K5900 Constipation, unspecified: Secondary | ICD-10-CM

## 2018-08-28 DIAGNOSIS — N183 Chronic kidney disease, stage 3 unspecified: Secondary | ICD-10-CM

## 2018-08-28 DIAGNOSIS — E785 Hyperlipidemia, unspecified: Secondary | ICD-10-CM | POA: Diagnosis not present

## 2018-08-28 DIAGNOSIS — E1140 Type 2 diabetes mellitus with diabetic neuropathy, unspecified: Secondary | ICD-10-CM | POA: Diagnosis not present

## 2018-08-28 DIAGNOSIS — I10 Essential (primary) hypertension: Secondary | ICD-10-CM | POA: Diagnosis not present

## 2018-08-28 DIAGNOSIS — G629 Polyneuropathy, unspecified: Secondary | ICD-10-CM

## 2018-08-28 DIAGNOSIS — Z794 Long term (current) use of insulin: Secondary | ICD-10-CM

## 2018-08-28 DIAGNOSIS — K219 Gastro-esophageal reflux disease without esophagitis: Secondary | ICD-10-CM | POA: Diagnosis not present

## 2018-08-28 DIAGNOSIS — R443 Hallucinations, unspecified: Secondary | ICD-10-CM

## 2018-08-28 DIAGNOSIS — D631 Anemia in chronic kidney disease: Secondary | ICD-10-CM

## 2018-08-28 NOTE — Progress Notes (Signed)
Careteam: Patient Care Team: Lauree Chandler, NP as PCP - General (Geriatric Medicine) Cameron Sprang, MD as Consulting Physician (Neurology)  Advanced Directive information Does Patient Have a Medical Advance Directive?: Yes, Type of Advance Directive: Healthcare Power of Attorney  Allergies  Allergen Reactions  . Oxycodone Other (See Comments)    "I'm swimming; I'm floating; I'm diving; I'm in another world."  . Zyprexa [Olanzapine]     Increase in hallucination     Chief Complaint  Patient presents with  . Medical Management of Chronic Issues    3 month follow-up and discuss Seroquel, not taking. Discuss labs (copy printed)   . Best Practice Recommendations    Foot Exam and MALB due, discuss need for Hep C. 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 Immunizations    RefusedTDaP and Shingrix      HPI: Patient is a 73 y.o. female seen in the office today for routine follow up.  Neuropathy- unchanged on lyrica 9m BID but so stopped. She tried gabapentin in the past also. Declines surgical intervention for carpal tunnel syndrome. Has not gotten the wrist braces. Tramadol did not work.   DM- currently taking lantus 40 units qhs and metformin 1000 mg daily (due to CKD). Did not bring meter today. Fasting blood sugars 60-90, evening blood sugars 160-210. A1c improved to 7.9  Psych disorder- recommended to see psychiatrist hallucinations- she has not gone yet. Not having any serious hallucinations.  Previously on seroquel then switched to zyprexa; recommended to restarted zyprexa 5 mg qhs and she started taking but episodes increased therefore zyprexa was stopped and now better off medication. Daughter felt like overall the seroquel did better. She was taking 100 mg by mouth twice daily.  discussed restarting on seroquel 25 mg BID at last visit but daughter in law reports they did not need to start and she has been doing great   GERD- using nexium with good results.    HTN- on norvasc, benicar, labetalol and demadex, controlled.   Constipation- using colace, having to strain and stool is hard.   Hyperlipidemia- on lipitor 20 mg daily ,LDL at goal 45  Has not gotten mamogram and bone density  She is not mobile, uses wheelchair and transfers to bed.  Review of Systems:  Review of Systems  Unable to perform ROS: Psychiatric disorder    Past Medical History:  Diagnosis Date  . Anemia of chronic disease   . Arthritis   . Back pain, chronic   . Blind   . Candida infection of mouth   . Diabetes mellitus   . Esophageal reflux   . GERD (gastroesophageal reflux disease)   . Hallucination   . Hand pain, left   . Hyperlipidemia   . Hypertension   . Hypertensive renal disease   . Insomnia   . Morbid obesity ([place]   . OAB (overactive bladder)   . Osteoarthritis    left shoulder  . Poor circulation   . PVD (peripheral vascular disease) ([place]   . Retinal detachment 11/2012  . Senile dementia with delusional features ([place]   . Senile purpura ([place] [employer]   . Vitamin D deficiency    Past Surgical History:  Procedure Laterality Date  . CATARACT EXTRACTION, BILATERAL    . INCISION AND DRAINAGE PERIRECTAL ABSCESS  11/20/2011   Procedure: IRRIGATION AND DEBRIDEMENT PERIRECTAL ABSCESS;  Surgeon: BZenovia Jarred MD;  [facility]  Service: General;  Laterality: Right;  Incision  and Drainage of Right Groin Abscess   Social History:   reports that she quit smoking about 42 years ago. Her smoking use included cigarettes. She has a 3.75 pack-year smoking history. She has never used smokeless tobacco. She reports that she does not drink alcohol or use drugs.  Family History  Problem Relation Age of Onset  . Heart disease Mother   . Diabetes Mother   . Hyperlipidemia Son   . Hypertension Son     Medications: Patient's Medications  New Prescriptions   No medications on file  Previous Medications   ACCU-CHEK FASTCLIX LANCETS MISC    by Does not  apply route. Check blood sugar 2 times daily   AMLODIPINE (NORVASC) 10 MG TABLET    Take 1 tablet (10 mg total) by mouth daily.   ASPIRIN EC 81 MG TABLET    Take 81 mg by mouth daily.   ATORVASTATIN (LIPITOR) 20 MG TABLET    Take 1 tablet (20 mg total) by mouth daily at 6 PM.   BLOOD GLUCOSE MONITORING SUPPL (ACCU-CHEK AVIVA PLUS) W/DEVICE KIT    Use to test Blood sugar three times daily. Dx: E11.40   CALCIUM PO    Take 1,000 mg by mouth 2 (two) times daily.    CILOSTAZOL (PLETAL) 100 MG TABLET    Take 0.5 tablets (50 mg total) by mouth every morning.   DOCUSATE SODIUM (COLACE) 100 MG CAPSULE    Take 100 mg by mouth 2 (two) times daily. Stool softener   ELASTIC BANDAGES & SUPPORTS (CARPAL TUNNEL WRIST STABILIZER) MISC    Apply to left wrist during the day, remove at night   ESOMEPRAZOLE (NEXIUM) 20 MG CAPSULE    Take 20 mg by mouth every morning.   GLUCOSE BLOOD TEST STRIP    Use to test blood sugar three times daily. Dx: E11.40   INSULIN GLARGINE (LANTUS) 100 UNIT/ML INJECTION    Inject 0.4 mLs (40 Units total) into the skin at bedtime. Dx: E11.40   INSULIN SYRINGE-NEEDLE U-100 (INSULIN SYRINGE 1CC/31GX5/16") 31G X 5/16" 1 ML MISC    1 Syringe by Does not apply route at bedtime. Dx: E11.40   IRON PO    Take 1 tablet by mouth every evening.    LABETALOL (NORMODYNE) 100 MG TABLET    Take 1 tablet (100 mg total) by mouth 2 (two) times daily.   METFORMIN (GLUCOPHAGE) 1000 MG TABLET    Take 1 tablet (1,000 mg total) by mouth daily with breakfast.   OLMESARTAN (BENICAR) 40 MG TABLET    Take 1 tablet (40 mg total) by mouth daily.   TORSEMIDE (DEMADEX) 20 MG TABLET    Take 0.5 tablets (10 mg total) by mouth every morning.   TRAMADOL (ULTRAM) 50 MG TABLET    Take 50 mg by mouth 2 (two) times daily as needed.  Modified Medications   No medications on file  Discontinued Medications   QUETIAPINE (SEROQUEL) 25 MG TABLET    Take 1 tablet (25 mg total) by mouth 2 (two) times daily.     Physical  Exam:  Vitals:   08/28/18 1047  BP: 140/82  Pulse: 75  SpO2: 96%  Weight: 260 lb (117.9 kg)  Height: _0  (1.676 m)   Body mass index is 41.97 kg/m.  Physical Exam Constitutional:      Appearance: Normal appearance. She is well-developed.  HENT:     Mouth/Throat:     Pharynx: No oropharyngeal exudate.  Eyes:  General: No scleral icterus.    Pupils: Pupils are equal, round, and reactive to light.     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     Thyroid: No thyromegaly.     Vascular: No carotid bruit.     Trachea: No tracheal deviation.  Cardiovascular:     Rate and Rhythm: Normal rate and regular rhythm.     Heart sounds: Murmur (1/6 SEM) present. No friction rub. No gallop.   Pulmonary:     Effort: Pulmonary effort is normal. No respiratory distress.     Breath sounds: Normal breath sounds. No stridor. No wheezing or rales.  Abdominal:     General: Bowel sounds are normal. There is no distension.     Palpations: Abdomen is soft. Abdomen is not rigid. There is no hepatomegaly or mass.     Tenderness: There is no abdominal tenderness. There is no guarding or rebound.     Hernia: No hernia is present.  Lymphadenopathy:     Cervical: No cervical adenopathy.  Skin:    General: Skin is warm and dry.     Findings: No rash.  Neurological:     Mental Status: She is alert.     Deep Tendon Reflexes: Reflexes are normal and symmetric.  Psychiatric:        Behavior: Behavior normal.        Thought Content: Thought content normal.        Judgment: Judgment normal.     Labs reviewed: Basic Metabolic Panel: Recent Labs    10/16/17 1007  04/16/18 1157 05/25/18 0852 08/24/18 0858  NA 144   < > 143 146 141  K 4.1   < > 4.4 5.1 4.5  CL 108   < > 108 111* 107  CO2 30   < > _0 GLUCOSE 123*   < > 303* 100* 119*  BUN 19   < > 22 20 28*  CREATININE 1.25*   < > 1.49* 1.21* 1.38*  CALCIUM 10.0   < > 10.3 10.0 10.5*   TSH 2.00  --   --   --   --    < > = values in this interval not displayed.   Liver Function Tests: Recent Labs    10/16/17 1007 04/16/18 1157 05/25/18 0852  AST 12 45* 14  ALT _1 ALKPHOS  --  50  --   BILITOT 0.4 0.5 0.3  PROT 6.0* 6.0* 6.0*  ALBUMIN  --  3.1*  --    No results for input(s): LIPASE, AMYLASE in the last 8760 hours. No results for input(s): AMMONIA in the last 8760 hours. CBC: Recent Labs    10/16/17 1007 04/16/18 1157 08/24/18 0858  WBC 4.1 5.3 4.6  NEUTROABS 1,935 3.5 1,753  HGB 10.1* 10.8* 10.7*  HCT 30.7* 34.3* 32.7*  MCV 89.8 94.0 90.1  PLT 212 208 237   Lipid Panel: Recent Labs    10/16/17 1007 05/25/18 0852  CHOL 121 117  HDL 59 59  LDLCALC 46 45  TRIG 78 44  CHOLHDL 2.1 2.0   TSH: Recent Labs    10/16/17 1007  TSH 2.00   A1C: Lab Results  Component Value Date   HGBA1C 7.8 (H) 08/24/2018     Assessment/Plan 1. Type 2 diabetes mellitus with diabetic neuropathy, with long-term current use of insulin (HCC) -A1c has improved. She will continue to work on  diet. Her blood sugar vary from 67-140s on review. She has lost weight.  - Hemoglobin A1c; Future - BASIC METABOLIC PANEL WITH GFR; Future  2. Neuropathy -stable. Medication was not effective.   3. Hallucination Have been much better recently. Off all medications.   4. Hyperlipidemia, unspecified hyperlipidemia type LDL at goal. Will continue lipitor.   5. Essential hypertension, benign Controlled on current regimen.   6. CKD (chronic kidney disease) stage 3, GFR 30-59 ml/min (HCC) -Encourage proper hydration and to avoid NSAIDS (Aleve, Advil, Motrin, Ibuprofen)   7. Constipation, unspecified constipation type Daughter in law reports she goes daily but pt reports she has to strain, can add miralax daily if needed  8. Gastroesophageal reflux disease without esophagitis Controlled on current regimen  9. Anemia due to stage 3 chronic kidney disease  (HCC) Stable at this time.   Next appt: 3 months with labs prior to visit.  Carlos American. Martha, [facility] [phone]

## 2018-08-28 NOTE — Patient Instructions (Addendum)
Add miralax if needed Increase water Continue to work on diet for optimal blood sugar control.   Follow up in 4 months with blood work prior to visit.

## 2018-09-07 DIAGNOSIS — R531 Weakness: Secondary | ICD-10-CM | POA: Diagnosis not present

## 2018-09-07 DIAGNOSIS — F0390 Unspecified dementia without behavioral disturbance: Secondary | ICD-10-CM | POA: Diagnosis not present

## 2018-09-07 DIAGNOSIS — M545 Low back pain: Secondary | ICD-10-CM | POA: Diagnosis not present

## 2018-09-23 ENCOUNTER — Other Ambulatory Visit: Payer: Self-pay | Admitting: Nurse Practitioner

## 2018-10-08 DIAGNOSIS — R531 Weakness: Secondary | ICD-10-CM | POA: Diagnosis not present

## 2018-10-08 DIAGNOSIS — M545 Low back pain: Secondary | ICD-10-CM | POA: Diagnosis not present

## 2018-10-08 DIAGNOSIS — F0390 Unspecified dementia without behavioral disturbance: Secondary | ICD-10-CM | POA: Diagnosis not present

## 2018-10-08 DIAGNOSIS — R269 Unspecified abnormalities of gait and mobility: Secondary | ICD-10-CM | POA: Diagnosis not present

## 2018-11-04 ENCOUNTER — Telehealth: Payer: Self-pay | Admitting: Neurology

## 2018-11-04 NOTE — Telephone Encounter (Signed)
Spoke with patient's daughter about scheduling e-visit.  She will discuss video visit with her mother and verify that she is okay to proceed with this and call us back.

## 2018-11-06 DIAGNOSIS — R269 Unspecified abnormalities of gait and mobility: Secondary | ICD-10-CM | POA: Diagnosis not present

## 2018-11-06 DIAGNOSIS — M545 Low back pain: Secondary | ICD-10-CM | POA: Diagnosis not present

## 2018-11-06 DIAGNOSIS — F0390 Unspecified dementia without behavioral disturbance: Secondary | ICD-10-CM | POA: Diagnosis not present

## 2018-11-06 DIAGNOSIS — R531 Weakness: Secondary | ICD-10-CM | POA: Diagnosis not present

## 2018-11-11 ENCOUNTER — Ambulatory Visit: Payer: Medicare HMO | Admitting: Neurology

## 2018-11-17 ENCOUNTER — Telehealth: Payer: Self-pay | Admitting: *Deleted

## 2018-11-17 ENCOUNTER — Other Ambulatory Visit: Payer: Self-pay | Admitting: Nurse Practitioner

## 2018-11-17 NOTE — Telephone Encounter (Signed)
Received FMLA Paperwork from son, Geselle Giasson. Request for Recertification Case ID: [id number] IFN For [facility]. Initially certified from 04/20/2018-10/20/2018 needs recert.   Placed paperwork in Jessica's folder to review, fill out and sign. Call [phone] once ready for pick up

## 2018-11-23 DIAGNOSIS — Z029 Encounter for administrative examinations, unspecified: Secondary | ICD-10-CM

## 2018-11-30 ENCOUNTER — Other Ambulatory Visit: Payer: Self-pay | Admitting: Nurse Practitioner

## 2018-11-30 DIAGNOSIS — E0822 Diabetes mellitus due to underlying condition with diabetic chronic kidney disease: Secondary | ICD-10-CM

## 2018-11-30 DIAGNOSIS — N183 Chronic kidney disease, stage 3 (moderate): Secondary | ICD-10-CM

## 2018-11-30 DIAGNOSIS — Z794 Long term (current) use of insulin: Secondary | ICD-10-CM

## 2018-11-30 DIAGNOSIS — E785 Hyperlipidemia, unspecified: Secondary | ICD-10-CM

## 2018-12-07 DIAGNOSIS — R269 Unspecified abnormalities of gait and mobility: Secondary | ICD-10-CM | POA: Diagnosis not present

## 2018-12-07 DIAGNOSIS — F0390 Unspecified dementia without behavioral disturbance: Secondary | ICD-10-CM | POA: Diagnosis not present

## 2018-12-07 DIAGNOSIS — R531 Weakness: Secondary | ICD-10-CM | POA: Diagnosis not present

## 2018-12-07 DIAGNOSIS — M545 Low back pain: Secondary | ICD-10-CM | POA: Diagnosis not present

## 2018-12-28 ENCOUNTER — Other Ambulatory Visit: Payer: Medicare HMO

## 2018-12-30 ENCOUNTER — Other Ambulatory Visit: Payer: Medicare HMO

## 2018-12-31 ENCOUNTER — Ambulatory Visit: Payer: Medicare HMO | Admitting: Nurse Practitioner

## 2019-01-06 DIAGNOSIS — R531 Weakness: Secondary | ICD-10-CM | POA: Diagnosis not present

## 2019-01-06 DIAGNOSIS — M545 Low back pain: Secondary | ICD-10-CM | POA: Diagnosis not present

## 2019-01-06 DIAGNOSIS — R269 Unspecified abnormalities of gait and mobility: Secondary | ICD-10-CM | POA: Diagnosis not present

## 2019-01-06 DIAGNOSIS — F0390 Unspecified dementia without behavioral disturbance: Secondary | ICD-10-CM | POA: Diagnosis not present

## 2019-01-07 ENCOUNTER — Ambulatory Visit: Payer: Medicare HMO | Admitting: Nurse Practitioner

## 2019-01-08 ENCOUNTER — Ambulatory Visit: Payer: Medicare HMO | Admitting: Nurse Practitioner

## 2019-01-08 ENCOUNTER — Ambulatory Visit (INDEPENDENT_AMBULATORY_CARE_PROVIDER_SITE_OTHER): Payer: Medicare HMO | Admitting: Nurse Practitioner

## 2019-01-08 ENCOUNTER — Other Ambulatory Visit: Payer: Self-pay

## 2019-01-08 ENCOUNTER — Encounter: Payer: Self-pay | Admitting: Nurse Practitioner

## 2019-01-08 DIAGNOSIS — G629 Polyneuropathy, unspecified: Secondary | ICD-10-CM | POA: Diagnosis not present

## 2019-01-08 DIAGNOSIS — N183 Chronic kidney disease, stage 3 unspecified: Secondary | ICD-10-CM

## 2019-01-08 DIAGNOSIS — R443 Hallucinations, unspecified: Secondary | ICD-10-CM

## 2019-01-08 DIAGNOSIS — E1140 Type 2 diabetes mellitus with diabetic neuropathy, unspecified: Secondary | ICD-10-CM | POA: Diagnosis not present

## 2019-01-08 DIAGNOSIS — Z794 Long term (current) use of insulin: Secondary | ICD-10-CM | POA: Diagnosis not present

## 2019-01-08 DIAGNOSIS — I10 Essential (primary) hypertension: Secondary | ICD-10-CM | POA: Diagnosis not present

## 2019-01-08 DIAGNOSIS — D631 Anemia in chronic kidney disease: Secondary | ICD-10-CM | POA: Diagnosis not present

## 2019-01-08 DIAGNOSIS — E785 Hyperlipidemia, unspecified: Secondary | ICD-10-CM

## 2019-01-08 MED ORDER — INSULIN GLARGINE 100 UNIT/ML ~~LOC~~ SOLN: 44.0000 [IU] | Freq: Every day | SUBCUTANEOUS | 11 refills | Status: DC

## 2019-01-08 NOTE — Patient Instructions (Addendum)
To increase lantus to 44 units daily- to continue to work on diet  To follow up LAB work in office   To follow up in 2 weeks for blood sugar review via tele-visit.    DASH Eating Plan DASH stands for "Dietary Approaches to Stop Hypertension." The DASH eating plan is a healthy eating plan that has been shown to reduce high blood pressure (hypertension). It may also reduce your risk for type 2 diabetes, heart disease, and stroke. The DASH eating plan may also help with weight loss. What are tips for following this plan?  General guidelines  Avoid eating more than 2,300 mg (milligrams) of salt (sodium) a day. If you have hypertension, you may need to reduce your sodium intake to 1,500 mg a day.  Limit alcohol intake to no more than 1 drink a day for nonpregnant women and 2 drinks a day for men. One drink equals 12 oz of beer, 5 oz of wine, or 1 oz of hard liquor.  Work with your health care provider to maintain a healthy body weight or to lose weight. Ask what an ideal weight is for you.  Get at least 30 minutes of exercise that causes your heart to beat faster (aerobic exercise) most days of the week. Activities may include walking, swimming, or biking.  Work with your health care provider or diet and nutrition specialist (dietitian) to adjust your eating plan to your individual calorie needs. Reading food labels   Check food labels for the amount of sodium per serving. Choose foods with less than 5 percent of the Daily Value of sodium. Generally, foods with less than 300 mg of sodium per serving fit into this eating plan.  To find whole grains, look for the word "whole" as the first word in the ingredient list. Shopping  Buy products labeled as "low-sodium" or "no salt added."  Buy fresh foods. Avoid canned foods and premade or frozen meals. Cooking  Avoid adding salt when cooking. Use salt-free seasonings or herbs instead of table salt or sea salt. Check with your health care  provider or pharmacist before using salt substitutes.  Do not fry foods. Cook foods using healthy methods such as baking, boiling, grilling, and broiling instead.  Cook with heart-healthy oils, such as olive, canola, soybean, or sunflower oil. Meal planning  Eat a balanced diet that includes: ? 5 or more servings of fruits and vegetables each day. At each meal, try to fill half of your plate with fruits and vegetables. ? Up to 6-8 servings of whole grains each day. ? Less than 6 oz of lean meat, poultry, or fish each day. A 3-oz serving of meat is about the same size as a deck of cards. One egg equals 1 oz. ? 2 servings of low-fat dairy each day. ? A serving of nuts, seeds, or beans 5 times each week. ? Heart-healthy fats. Healthy fats called Omega-3 fatty acids are found in foods such as flaxseeds and coldwater fish, like sardines, salmon, and mackerel.  Limit how much you eat of the following: ? Canned or prepackaged foods. ? Food that is high in trans fat, such as fried foods. ? Food that is high in saturated fat, such as fatty meat. ? Sweets, desserts, sugary drinks, and other foods with added sugar. ? Full-fat dairy products.  Do not salt foods before eating.  Try to eat at least 2 vegetarian meals each week.  Eat more home-cooked food and less restaurant, buffet, and fast food.  When eating at a restaurant, ask that your food be prepared with less salt or no salt, if possible. What foods are recommended? The items listed may not be a complete list. Talk with your dietitian about what dietary choices are best for you. Grains Whole-grain or whole-wheat bread. Whole-grain or whole-wheat pasta. Brown rice. [place]. Bulgur. Whole-grain and low-sodium cereals. Pita bread. Low-fat, low-sodium crackers. Whole-wheat flour tortillas. Vegetables Fresh or frozen vegetables (raw, steamed, roasted, or grilled). Low-sodium or reduced-sodium tomato and vegetable juice. Low-sodium or  reduced-sodium tomato sauce and tomato paste. Low-sodium or reduced-sodium canned vegetables. Fruits All fresh, dried, or frozen fruit. Canned fruit in natural juice (without added sugar). Meat and other protein foods Skinless chicken or [place]. Ground chicken or [place]. Pork with fat trimmed off. Fish and seafood. Egg whites. Dried beans, peas, or lentils. Unsalted nuts, nut butters, and seeds. Unsalted canned beans. Lean cuts of beef with fat trimmed off. Low-sodium, lean deli meat. Dairy Low-fat (1%) or fat-free (skim) milk. Fat-free, low-fat, or reduced-fat cheeses. Nonfat, low-sodium ricotta or cottage cheese. Low-fat or nonfat yogurt. Low-fat, low-sodium cheese. Fats and oils Soft margarine without trans fats. Vegetable oil. Low-fat, reduced-fat, or light mayonnaise and salad dressings (reduced-sodium). Canola, safflower, olive, soybean, and sunflower oils. Avocado. Seasoning and other foods Herbs. Spices. Seasoning mixes without salt. Unsalted popcorn and pretzels. Fat-free sweets. What foods are not recommended? The items listed may not be a complete list. Talk with your dietitian about what dietary choices are best for you. Grains Baked goods made with fat, such as croissants, muffins, or some breads. Dry pasta or rice meal packs. Vegetables Creamed or fried vegetables. Vegetables in a cheese sauce. Regular canned vegetables (not low-sodium or reduced-sodium). Regular canned tomato sauce and paste (not low-sodium or reduced-sodium). Regular tomato and vegetable juice (not low-sodium or reduced-sodium). Angie Fava. Olives. Fruits Canned fruit in a light or heavy syrup. Fried fruit. Fruit in cream or butter sauce. Meat and other protein foods Fatty cuts of meat. Ribs. Fried meat. Berniece Salines. Sausage. Bologna and other processed lunch meats. Salami. Fatback. Hotdogs. Bratwurst. Salted nuts and seeds. Canned beans with added salt. Canned or smoked fish. Whole eggs or egg yolks. Chicken or [place]  with skin. Dairy Whole or 2% milk, cream, and half-and-half. Whole or full-fat cream cheese. Whole-fat or sweetened yogurt. Full-fat cheese. Nondairy creamers. Whipped toppings. Processed cheese and cheese spreads. Fats and oils Butter. Stick margarine. Lard. Shortening. Ghee. Bacon fat. Tropical oils, such as coconut, palm kernel, or palm oil. Seasoning and other foods Salted popcorn and pretzels. Onion salt, garlic salt, seasoned salt, table salt, and sea salt. Worcestershire sauce. Tartar sauce. Barbecue sauce. Teriyaki sauce. Soy sauce, including reduced-sodium. Steak sauce. Canned and packaged gravies. Fish sauce. Oyster sauce. Cocktail sauce. Horseradish that you find on the shelf. Ketchup. Mustard. Meat flavorings and tenderizers. Bouillon cubes. Hot sauce and Tabasco sauce. Premade or packaged marinades. Premade or packaged taco seasonings. Relishes. Regular salad dressings. Where to find more information:  National Heart, Lung, and [place]: https://wilson-eaton.com/  American Heart Association: www.heart.org Summary  The DASH eating plan is a healthy eating plan that has been shown to reduce high blood pressure (hypertension). It may also reduce your risk for type 2 diabetes, heart disease, and stroke.  With the DASH eating plan, you should limit salt (sodium) intake to 2,300 mg a day. If you have hypertension, you may need to reduce your sodium intake to 1,500 mg a day.  When on the DASH eating plan,  aim to eat more fresh fruits and vegetables, whole grains, lean proteins, low-fat dairy, and heart-healthy fats.  Work with your health care provider or diet and nutrition specialist (dietitian) to adjust your eating plan to your individual calorie needs. This information is not intended to replace advice given to you by your health care provider. Make sure you discuss any questions you have with your health care provider. Document Released: 07/18/2011 Document Revised: 07/22/2016  Document Reviewed: 07/22/2016 Elsevier Interactive Patient Education  2019 [employer].

## 2019-01-08 NOTE — Progress Notes (Signed)
This service is provided via telemedicine  No vital signs collected/recorded due to the encounter was a telemedicine visit.   Location of patient (ex: home, work):  Home  Patient consents to a telephone visit:  Yes  Location of the provider (ex: office, home):  [facility], Office   Name of any referring provider:  N/A  Names of all persons participating in the telemedicine service and their role in the encounter:  S.Chrae B/CMA, Sherrie Mustache, NP, Raquel Sarna (son) and Patient   Time spent on call: 7 min with medical assistant      Careteam: Patient Care Team: Lauree Chandler, NP as PCP - General (Geriatric Medicine) Cameron Sprang, MD as Consulting Physician (Neurology)  Advanced Directive information Does Patient Have a Medical Advance Directive?: Yes, Type of Advance Directive: Living will  Allergies  Allergen Reactions  . Oxycodone Other (See Comments)    "I'm swimming; I'm floating; I'm diving; I'm in another world."  . Zyprexa [Olanzapine]     Increase in hallucination     Chief Complaint  Patient presents with  . Medical Management of Chronic Issues    4 month follow-up. Telephone visit   . Best Practice Recommendations    Discuss need for Hep C screening and DEXA scan  . Immunizations    Discuss need for TDaP and Shingrix      HPI: Patient is a 72 y.o. female for routine follow up via televisit  Neuropathy- unchanged. Declines surgical intervention for carpal tunnel syndrome. Has not gotten the wrist braces.    DM- A1c 7.8 4 months ago. currently taking lantus 40 units qhs and metformin 1000 mg daily (due to CKD).  Blood sugar this morning was 128.  Reports it has been high generally in the morning 202, 184.  No hypoglycemia.  Blood sugars in the 200-300s in the evening.   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 Not having any serious hallucinations.  Previously on seroquel then  switched to zyprexa; recommended to restarted zyprexa 5 mg qhs and she started taking but episodes increased therefore zyprexa was stopped and now better off medication. Daughter felt like overall the seroquel did better. She was taking 100 mg by mouth twice daily.  discussed restarting on seroquel 25 mg BID at last visit but daughter in law reports they did not need to start and she has been doing great. Pt reports she is depressed but does not wish to see psychiatrist or counselor. Reports she is still having "epsiodes" that are getting worse and lasting longer. Reports they are happening every weekend, over the last 2 weeks. Prior had been months and did not happen often. Son reports these hallucinations have worsen since the pandemic.   GERD- using nexium, reports bad reflux after certain meals, has changed diet which helps at times .   HTN- on norvasc, benicar, labetalol and demadex does not check blood pressure at home.    Constipation- controlled on colace.   Hyperlipidemia- on lipitor 20 mg daily ,LDL at goal 45  Has not gotten mamogram and bone density  She is not mobile, uses wheelchair and transfers to bed.   Anemia- continues on iron daily   Review of Systems:  Review of Systems  Constitutional: Negative for chills, fever and weight loss.  HENT: Negative for tinnitus.   Eyes:       Legally blind  Respiratory: Negative for cough, sputum production and shortness of breath.  Cardiovascular: Negative for chest pain, palpitations and leg swelling.  Gastrointestinal: Positive for heartburn (with certain foods). Negative for abdominal pain, constipation and diarrhea.  Genitourinary: Negative for dysuria, frequency and urgency.  Musculoskeletal: Negative for back pain, falls, joint pain and myalgias.  Skin: Negative.   Neurological: Negative for dizziness, tremors, weakness and headaches.  Psychiatric/Behavioral: Positive for depression and memory loss. Negative for suicidal  ideas. The patient is not nervous/anxious and does not have insomnia.    Past Medical History:  Diagnosis Date  . Anemia of chronic disease   . Arthritis   . Back pain, chronic   . Blind   . Candida infection of mouth   . Diabetes mellitus   . Esophageal reflux   . GERD (gastroesophageal reflux disease)   . Hallucination   . Hand pain, left   . Hyperlipidemia   . Hypertension   . Hypertensive renal disease   . Insomnia   . Morbid obesity ([place])   . OAB (overactive bladder)   . Osteoarthritis    left shoulder  . Poor circulation   . PVD (peripheral vascular disease) ([place])   . Retinal detachment 11/2012  . 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 Senile purpura ([place])   . Vitamin D deficiency    Past Surgical History:  Procedure Laterality Date  . CATARACT EXTRACTION, BILATERAL    . INCISION AND DRAINAGE PERIRECTAL ABSCESS  11/20/2011   Procedure: IRRIGATION AND DEBRIDEMENT PERIRECTAL ABSCESS;  Surgeon: Zenovia Jarred, MD;  Location: [place];  Service: General;  Laterality: Right;  Incision and Drainage of Right Groin Abscess   Social History:   reports that she quit smoking about 42 years ago. Her smoking use included cigarettes. She has a 3.75 pack-year smoking history. She has never used smokeless tobacco. She reports that she does not drink alcohol or use drugs.  Family History  Problem Relation Age of Onset  . Heart disease Mother   . Diabetes Mother   . Hyperlipidemia Son   . Hypertension Son     Medications: Patient's Medications  New Prescriptions   No medications on file  Previous Medications   ACCU-CHEK FASTCLIX LANCETS MISC    by Does not apply route. Check blood sugar 2 times daily   AMLODIPINE (NORVASC) 10 MG TABLET    Take 1 tablet (10 mg total) by mouth daily.   ASPIRIN EC 81 MG TABLET    Take 81 mg by mouth daily.   ATORVASTATIN (LIPITOR) 20 MG TABLET    TAKE 1 TABLET(20 MG) BY MOUTH DAILY AT 6 PM   BLOOD GLUCOSE MONITORING SUPPL (ACCU-CHEK  AVIVA PLUS) W/DEVICE KIT    Use to test Blood sugar three times daily. Dx: E11.40   CALCIUM PO    Take 1,000 mg by mouth 2 (two) times daily.    CILOSTAZOL (PLETAL) 100 MG TABLET    Take 0.5 tablets (50 mg total) by mouth every morning.   DOCUSATE SODIUM (COLACE) 100 MG CAPSULE    Take 100 mg by mouth 2 (two) times daily. Stool softener   ESOMEPRAZOLE (NEXIUM) 20 MG CAPSULE    Take 20 mg by mouth every morning.   GLUCOSE BLOOD TEST STRIP    Use to test blood sugar three times daily. Dx: E11.40   INSULIN SYRINGE-NEEDLE U-100 (INSULIN SYRINGE 1CC/31GX5/16") 31G X 5/16" 1 ML MISC    1 Syringe by Does not apply route at bedtime. Dx: E11.40   IRON PO    Take  1 tablet by mouth every evening.    LABETALOL (NORMODYNE) 100 MG TABLET    TAKE 1 TABLET(100 MG) BY MOUTH TWICE DAILY   LANTUS 100 UNIT/ML INJECTION    INJECT 40 UNTIS UNDER THE SKIN AT BEDTIME   METFORMIN (GLUCOPHAGE) 1000 MG TABLET    TAKE 1 TABLET(1000 MG) BY MOUTH DAILY WITH BREAKFAST   OLMESARTAN (BENICAR) 40 MG TABLET    Take 1 tablet (40 mg total) by mouth daily.   TORSEMIDE (DEMADEX) 20 MG TABLET    Take 0.5 tablets (10 mg total) by mouth every morning.   TRAMADOL (ULTRAM) 50 MG TABLET    Take 50 mg by mouth 2 (two) times daily as needed.  Modified Medications   No medications on file  Discontinued Medications   ELASTIC BANDAGES & SUPPORTS (CARPAL TUNNEL WRIST STABILIZER) MISC    Apply to left wrist during the day, remove at night    Physical Exam:  There were no vitals filed for this visit. There is no height or weight on file to calculate BMI. Wt Readings from Last 3 Encounters:  08/28/18 260 lb (117.9 kg)  05/28/18 267 lb (121.1 kg)  03/24/18 269 lb 6.4 oz (122.2 kg)      Labs reviewed: Basic Metabolic Panel: Recent Labs    04/16/18 1157 05/25/18 0852 08/24/18 0858  NA 143 146 141  K 4.4 5.1 4.5  CL 108 111* 107  CO2 _0 GLUCOSE 303* 100* 119*  BUN 22 20 28*  CREATININE 1.49* 1.21* 1.38*  CALCIUM 10.3  10.0 10.5*   Liver Function Tests: Recent Labs    04/16/18 1157 05/25/18 0852  AST 45* 14  ALT 28 12  ALKPHOS 50  --   BILITOT 0.5 0.3  PROT 6.0* 6.0*  ALBUMIN 3.1*  --    No results for input(s): LIPASE, AMYLASE in the last 8760 hours. No results for input(s): AMMONIA in the last 8760 hours. CBC: Recent Labs    04/16/18 1157 08/24/18 0858  WBC 5.3 4.6  NEUTROABS 3.5 1,753  HGB 10.8* 10.7*  HCT 34.3* 32.7*  MCV 94.0 90.1  PLT 208 237   Lipid Panel: Recent Labs    05/25/18 0852  CHOL 117  HDL 59  LDLCALC 45  TRIG 44  CHOLHDL 2.0   TSH: No results for input(s): TSH in the last 8760 hours. A1C: Lab Results  Component Value Date   HGBA1C 7.8 (H) 08/24/2018     Assessment/Plan 1. Type 2 diabetes mellitus with diabetic neuropathy, with long-term current use of insulin (HCC) -blood sugars are worse. Encouraged dietary modifications.  -will increase lantus to 44 units at this time. Continue to monitor blood sugars and will review at follow up -continues on metformin 1000 mg daily.  - Hemoglobin A1c; Future  2. Hallucination -worsening hallucinations at this time. Pt is willing to see psych services at this time. This has been recommended in the past but hallucinations improved therefore she did not go. Will obtain blood work to make sure there is not an abnormality causing worsening of mood and hallucination.  - TSH; Future  3. Neuropathy Ongoing and stable at this time. Previously medication did not add benefit.   4. Hyperlipidemia, unspecified hyperlipidemia type Stable on lipitor and diet modifications. - COMPLETE METABOLIC PANEL WITH GFR; Future - Lipid panel; Future  5. CKD (chronic kidney disease) stage 3, GFR 30-59 ml/min (HCC) Encourage proper hydration and to avoid NSAIDS (Aleve, Advil, Motrin, Ibuprofen)  - COMPLETE  METABOLIC PANEL WITH GFR; Future  6. Essential hypertension, benign -unable to check blood pressure at home. Will continue current  regimen with dietary modifications.  - CBC with Differential/Platelet; Future  7. Anemia due to stage 3 chronic kidney disease ([place]) Continues on iron, will follow up lab.  - CBC with Differential/Platelet; Future  Next appt: to follow up labs in office next week,  2 weeks for blood sugars,  Jessica K. Harle Battiest  [facility] [phone]    Virtual Visit via Telephone Note  I connected with office on 01/08/19 at  3:15 PM EDT by telephone and verified that I am speaking with the correct person using two identifiers.  Location: Patient: home Provider: office   I discussed the limitations, risks, security and privacy concerns of performing an evaluation and management service by telephone and the availability of in person appointments. I also discussed with the patient that there may be a patient responsible charge related to this service. The patient expressed understanding and agreed to proceed.   I discussed the assessment and treatment plan with the patient. The patient was provided an opportunity to ask questions and all were answered. The patient agreed with the plan and demonstrated an understanding of the instructions.   The patient was advised to call back or seek an in-person evaluation if the symptoms worsen or if the condition fails to improve as anticipated.  I provided 26 minutes of non-face-to-face time during this encounter.  Carlos [employer]. Harle Battiest Avs printed and mailed

## 2019-01-15 ENCOUNTER — Other Ambulatory Visit: Payer: Self-pay

## 2019-01-15 ENCOUNTER — Other Ambulatory Visit (INDEPENDENT_AMBULATORY_CARE_PROVIDER_SITE_OTHER): Payer: Medicare HMO

## 2019-01-15 DIAGNOSIS — E785 Hyperlipidemia, unspecified: Secondary | ICD-10-CM | POA: Diagnosis not present

## 2019-01-15 DIAGNOSIS — I10 Essential (primary) hypertension: Secondary | ICD-10-CM

## 2019-01-15 DIAGNOSIS — R8290 Unspecified abnormal findings in urine: Secondary | ICD-10-CM

## 2019-01-15 DIAGNOSIS — D631 Anemia in chronic kidney disease: Secondary | ICD-10-CM

## 2019-01-15 DIAGNOSIS — E1140 Type 2 diabetes mellitus with diabetic neuropathy, unspecified: Secondary | ICD-10-CM

## 2019-01-15 DIAGNOSIS — R443 Hallucinations, unspecified: Secondary | ICD-10-CM | POA: Diagnosis not present

## 2019-01-15 DIAGNOSIS — N183 Chronic kidney disease, stage 3 unspecified: Secondary | ICD-10-CM

## 2019-01-15 DIAGNOSIS — Z794 Long term (current) use of insulin: Secondary | ICD-10-CM | POA: Diagnosis not present

## 2019-01-15 LAB — POCT URINALYSIS DIPSTICK
Bilirubin, UA: NEGATIVE
Blood, UA: NEGATIVE
Glucose, UA: NEGATIVE
Ketones, UA: NEGATIVE
Leukocytes, UA: NEGATIVE
Nitrite, UA: POSITIVE
Protein, UA: NEGATIVE
Spec Grav, UA: 1.01 (ref 1.010–1.025)
Urobilinogen, UA: 0.2 E.U./dL
pH, UA: 6 (ref 5.0–8.0)

## 2019-01-15 NOTE — Addendum Note (Signed)
Addended by: Maurice Small on: 01/15/2019 11:59 AM   Modules accepted: Orders

## 2019-01-16 LAB — CBC WITH DIFFERENTIAL/PLATELET
Absolute Monocytes: 232 cells/uL (ref 200–950)
Basophils Absolute: 30 cells/uL (ref 0–200)
Basophils Relative: 0.7 %
Eosinophils Absolute: 142 cells/uL (ref 15–500)
Eosinophils Relative: 3.3 %
HCT: 33.8 % — ABNORMAL LOW (ref 35.0–45.0)
Hemoglobin: 11.1 g/dL — ABNORMAL LOW (ref 11.7–15.5)
Lymphs Abs: 2197 cells/uL (ref 850–3900)
MCH: 29.6 pg (ref 27.0–33.0)
MCHC: 32.8 g/dL (ref 32.0–36.0)
MCV: 90.1 fL (ref 80.0–100.0)
MPV: 11.5 fL (ref 7.5–12.5)
Monocytes Relative: 5.4 %
Neutro Abs: 1699 cells/uL (ref 1500–7800)
Neutrophils Relative %: 39.5 %
Platelets: 244 10*3/uL (ref 140–400)
RBC: 3.75 10*6/uL — ABNORMAL LOW (ref 3.80–5.10)
RDW: 13.6 % (ref 11.0–15.0)
Total Lymphocyte: 51.1 %
WBC: 4.3 10*3/uL (ref 3.8–10.8)

## 2019-01-16 LAB — COMPLETE METABOLIC PANEL WITH GFR
AG Ratio: 1.3 (calc) (ref 1.0–2.5)
ALT: 10 U/L (ref 6–29)
AST: 11 U/L (ref 10–35)
Albumin: 3.4 g/dL — ABNORMAL LOW (ref 3.6–5.1)
Alkaline phosphatase (APISO): 58 U/L (ref 37–153)
BUN/Creatinine Ratio: 18 (calc) (ref 6–22)
BUN: 28 mg/dL — ABNORMAL HIGH (ref 7–25)
CO2: 27 mmol/L (ref 20–32)
Calcium: 10 mg/dL (ref 8.6–10.4)
Chloride: 107 mmol/L (ref 98–110)
Creat: 1.52 mg/dL — ABNORMAL HIGH (ref 0.60–0.93)
GFR, Est African American: 38 mL/min/{1.73_m2} — ABNORMAL LOW (ref >=60)
GFR, Est Non African American: 33 mL/min/{1.73_m2} — ABNORMAL LOW (ref >=60)
Globulin: 2.6 g/dL (calc) (ref 1.9–3.7)
Glucose, Bld: 116 mg/dL — ABNORMAL HIGH (ref 65–99)
Potassium: 4.4 mmol/L (ref 3.5–5.3)
Sodium: 141 mmol/L (ref 135–146)
Total Bilirubin: 0.4 mg/dL (ref 0.2–1.2)
Total Protein: 6 g/dL — ABNORMAL LOW (ref 6.1–8.1)

## 2019-01-16 LAB — HEMOGLOBIN A1C
Hgb A1c MFr Bld: 9.2 % of total Hgb — ABNORMAL HIGH (ref <5.7)
Mean Plasma Glucose: 217 (calc)
eAG (mmol/L): 12 (calc)

## 2019-01-16 LAB — LIPID PANEL
Cholesterol: 125 mg/dL (ref <200)
HDL: 65 mg/dL (ref >=50)
LDL Cholesterol (Calc): 46 mg/dL (calc)
Non-HDL Cholesterol (Calc): 60 mg/dL (calc) (ref <130)
Total CHOL/HDL Ratio: 1.9 (calc) (ref <5.0)
Triglycerides: 56 mg/dL (ref <150)

## 2019-01-16 LAB — TSH: TSH: 3.69 mIU/L (ref 0.40–4.50)

## 2019-01-17 LAB — URINE CULTURE
MICRO NUMBER:: 542028
SPECIMEN QUALITY:: ADEQUATE

## 2019-01-18 ENCOUNTER — Ambulatory Visit: Payer: Self-pay

## 2019-01-18 ENCOUNTER — Encounter: Payer: Self-pay | Admitting: Family

## 2019-01-19 ENCOUNTER — Other Ambulatory Visit: Payer: Self-pay | Admitting: Nurse Practitioner

## 2019-01-19 ENCOUNTER — Encounter: Payer: Self-pay | Admitting: Family

## 2019-01-19 MED ORDER — AMOXICILLIN-POT CLAVULANATE 875-125 MG PO TABS: 1.0000 | ORAL_TABLET | Freq: Two times a day (BID) | ORAL | 0 refills | Status: DC

## 2019-01-21 ENCOUNTER — Encounter: Payer: Self-pay | Admitting: Nurse Practitioner

## 2019-01-21 ENCOUNTER — Ambulatory Visit (INDEPENDENT_AMBULATORY_CARE_PROVIDER_SITE_OTHER): Payer: Medicare HMO | Admitting: Nurse Practitioner

## 2019-01-21 ENCOUNTER — Other Ambulatory Visit: Payer: Self-pay

## 2019-01-21 DIAGNOSIS — Z1231 Encounter for screening mammogram for malignant neoplasm of breast: Secondary | ICD-10-CM | POA: Diagnosis not present

## 2019-01-21 DIAGNOSIS — Z794 Long term (current) use of insulin: Secondary | ICD-10-CM

## 2019-01-21 DIAGNOSIS — N390 Urinary tract infection, site not specified: Secondary | ICD-10-CM | POA: Diagnosis not present

## 2019-01-21 DIAGNOSIS — E1140 Type 2 diabetes mellitus with diabetic neuropathy, unspecified: Secondary | ICD-10-CM

## 2019-01-21 DIAGNOSIS — Z0000 Encounter for general adult medical examination without abnormal findings: Secondary | ICD-10-CM

## 2019-01-21 DIAGNOSIS — E2839 Other primary ovarian failure: Secondary | ICD-10-CM

## 2019-01-21 NOTE — Patient Instructions (Addendum)
To make diet changes and attempt to increase activity.   Increase water intake.    Diabetes Mellitus and Nutrition, Adult When you have diabetes (diabetes mellitus), it is very important to have healthy eating habits because your blood sugar (glucose) levels are greatly affected by what you eat and drink. Eating healthy foods in the appropriate amounts, at about the same times every day, can help you:  Control your blood glucose.  Lower your risk of heart disease.  Improve your blood pressure.  Reach or maintain a healthy weight. Every person with diabetes is different, and each person has different needs for a meal plan. Your health care provider may recommend that you work with a diet and nutrition specialist (dietitian) to make a meal plan that is best for you. Your meal plan may vary depending on factors such as:  The calories you need.  The medicines you take.  Your weight.  Your blood glucose, blood pressure, and cholesterol levels.  Your activity level.  Other health conditions you have, such as heart or kidney disease. How do carbohydrates affect me? Carbohydrates, also called carbs, affect your blood glucose level more than any other type of food. Eating carbs naturally raises the amount of glucose in your blood. Carb counting is a method for keeping track of how many carbs you eat. Counting carbs is important to keep your blood glucose at a healthy level, especially if you use insulin or take certain oral diabetes medicines. It is important to know how many carbs you can safely have in each meal. This is different for every person. Your dietitian can help you calculate how many carbs you should have at each meal and for each snack. Foods that contain carbs include:  Bread, cereal, rice, pasta, and crackers.  Potatoes and corn.  Peas, beans, and lentils.  Milk and yogurt.  Fruit and juice.  Desserts, such as cakes, cookies, ice cream, and candy. How does alcohol  affect me? Alcohol can cause a sudden decrease in blood glucose (hypoglycemia), especially if you use insulin or take certain oral diabetes medicines. Hypoglycemia can be a life-threatening condition. Symptoms of hypoglycemia (sleepiness, dizziness, and confusion) are similar to symptoms of having too much alcohol. If your health care provider says that alcohol is safe for you, follow these guidelines:  Limit alcohol intake to no more than 1 drink per day for nonpregnant women and 2 drinks per day for men. One drink equals 12 oz of beer, 5 oz of wine, or 1 oz of hard liquor.  Do not drink on an empty stomach.  Keep yourself hydrated with water, diet soda, or unsweetened iced tea.  Keep in mind that regular soda, juice, and other mixers may contain a lot of sugar and must be counted as carbs. What are tips for following this plan?  Reading food labels  Start by checking the serving size on the "Nutrition Facts" label of packaged foods and drinks. The amount of calories, carbs, fats, and other nutrients listed on the label is based on one serving of the item. Many items contain more than one serving per package.  Check the total grams (g) of carbs in one serving. You can calculate the number of servings of carbs in one serving by dividing the total carbs by 15. For example, if a food has 30 g of total carbs, it would be equal to 2 servings of carbs.  Check the number of grams (g) of saturated and trans fats in one  serving. Choose foods that have low or no amount of these fats.  Check the number of milligrams (mg) of salt (sodium) in one serving. Most people should limit total sodium intake to less than 2,300 mg per day.  Always check the nutrition information of foods labeled as "low-fat" or "nonfat". These foods may be higher in added sugar or refined carbs and should be avoided.  Talk to your dietitian to identify your daily goals for nutrients listed on the label. Shopping  Avoid buying  canned, premade, or processed foods. These foods tend to be high in fat, sodium, and added sugar.  Shop around the outside edge of the grocery store. This includes fresh fruits and vegetables, bulk grains, fresh meats, and fresh dairy. Cooking  Use low-heat cooking methods, such as baking, instead of high-heat cooking methods like deep frying.  Cook using healthy oils, such as olive, canola, or sunflower oil.  Avoid cooking with butter, cream, or high-fat meats. Meal planning  Eat meals and snacks regularly, preferably at the same times every day. Avoid going long periods of time without eating.  Eat foods high in fiber, such as fresh fruits, vegetables, beans, and whole grains. Talk to your dietitian about how many servings of carbs you can eat at each meal.  Eat 4-6 ounces (oz) of lean protein each day, such as lean meat, chicken, fish, eggs, or tofu. One oz of lean protein is equal to: ? 1 oz of meat, chicken, or fish. ? 1 egg. ?  cup of tofu.  Eat some foods each day that contain healthy fats, such as avocado, nuts, seeds, and fish. Lifestyle  Check your blood glucose regularly.  Exercise regularly as told by your health care provider. This may include: ? 150 minutes of moderate-intensity or vigorous-intensity exercise each week. This could be brisk walking, biking, or water aerobics. ? Stretching and doing strength exercises, such as yoga or weightlifting, at least 2 times a week.  Take medicines as told by your health care provider.  Do not use any products that contain nicotine or tobacco, such as cigarettes and e-cigarettes. If you need help quitting, ask your health care provider.  Work with a Social worker or diabetes educator to identify strategies to manage stress and any emotional and social challenges. Questions to ask a health care provider  Do I need to meet with a diabetes educator?  Do I need to meet with a dietitian?  What number can I call if I have  questions?  When are the best times to check my blood glucose? Where to find more information:  American Diabetes Association: diabetes.org  Academy of Nutrition and Dietetics: www.eatright.[employer] of Diabetes and Digestive and Kidney Diseases (NIH): [employer] Summary  A healthy meal plan will help you control your blood glucose and maintain a healthy lifestyle.  Working with a diet and nutrition specialist (dietitian) can help you make a meal plan that is best for you.  Keep in mind that carbohydrates (carbs) and alcohol have immediate effects on your blood glucose levels. It is important to count carbs and to use alcohol carefully. This information is not intended to replace advice given to you by your health care provider. Make sure you discuss any questions you have with your health care provider. Document Released: 04/25/2005 Document Revised: 02/26/2017 Document Reviewed: 09/02/2016 Elsevier Interactive Patient Education  2019 [employer].

## 2019-01-21 NOTE — Progress Notes (Signed)
    This service is provided via telemedicine  No vital signs collected/recorded due to the encounter was a telemedicine visit.   Location of patient (ex: home, work):  Home   Patient consents to a telephone visit:  Yes  Location of the provider (ex: office, home): [facility], Office   Name of any referring provider: N/A  Names of all persons participating in the telemedicine service and their role in the encounter:  S.Chrae B/CMA, Sherrie Mustache, NP, Burnice Logan (daughter) and Patient   Time spent on call:  12 min with medical assistant

## 2019-01-21 NOTE — Patient Instructions (Signed)
[name] , Thank you for taking time to come for your Medicare Wellness Visit. I appreciate your ongoing commitment to your health goals. Please review the following plan we discussed and let me know if I can assist you in the future.   Screening recommendations/referrals: Colonoscopy- cologuard up to date Mammogram -placed order, for you to schedule Bone Density -placed order, for you to schedule Recommended yearly ophthalmology/optometry visit for glaucoma screening and checkup Recommended yearly dental visit for hygiene and checkup  Vaccinations: Influenza vaccine 03/2019 Pneumococcal vaccine up to date Tdap vaccine -Rx sent to pharmacy  Shingles vaccine - declined.     Advanced directives: information sent  Conditions/risks identified: at risk for cardiovascular disease  Next appointment: 1 year   Preventive Care 41 Years and Older, Female Preventive care refers to lifestyle choices and visits with your health care provider that can promote health and wellness. What does preventive care include?  A yearly physical exam. This is also called an annual well check.  Dental exams once or twice a year.  Routine eye exams. Ask your health care provider how often you should have your eyes checked.  Personal lifestyle choices, including:  Daily care of your teeth and gums.  Regular physical activity.  Eating a healthy diet.  Avoiding tobacco and drug use.  Limiting alcohol use.  Practicing safe sex.  Taking low-dose aspirin every day.  Taking vitamin and mineral supplements as recommended by your health care provider. What happens during an annual well check? The services and screenings done by your health care provider during your annual well check will depend on your age, overall health, lifestyle risk factors, and family history of disease. Counseling  Your health care provider may ask you questions about your:  Alcohol use.  Tobacco use.  Drug use.   Emotional well-being.  Home and relationship well-being.  Sexual activity.  Eating habits.  History of falls.  Memory and ability to understand (cognition).  Work and work Statistician.  Reproductive health. Screening  You may have the following tests or measurements:  Height, weight, and BMI.  Blood pressure.  Lipid and cholesterol levels. These may be checked every 5 years, or more frequently if you are over 27 years old.  Skin check.  Lung cancer screening. You may have this screening every year starting at age 86 if you have a 30-pack-year history of smoking and currently smoke or have quit within the past 15 years.  Fecal occult blood test (FOBT) of the stool. You may have this test every year starting at age 34.  Flexible sigmoidoscopy or colonoscopy. You may have a sigmoidoscopy every 5 years or a colonoscopy every 10 years starting at age 41.  Hepatitis C blood test.  Hepatitis B blood test.  Sexually transmitted disease (STD) testing.  Diabetes screening. This is done by checking your blood sugar (glucose) after you have not eaten for a while (fasting). You may have this done every 1-3 years.  Bone density scan. This is done to screen for osteoporosis. You may have this done starting at age 80.  Mammogram. This may be done every 1-2 years. Talk to your health care provider about how often you should have regular mammograms. Talk with your health care provider about your test results, treatment options, and if necessary, the need for more tests. Vaccines  Your health care provider may recommend certain vaccines, such as:  Influenza vaccine. This is recommended every year.  Tetanus, diphtheria, and acellular pertussis (Tdap, Td)  vaccine. You may need a Td booster every 10 years.  Zoster vaccine. You may need this after age 47.  Pneumococcal 13-valent conjugate (PCV13) vaccine. One dose is recommended after age 84.  Pneumococcal polysaccharide (PPSV23)  vaccine. One dose is recommended after age 93. Talk to your health care provider about which screenings and vaccines you need and how often you need them. This information is not intended to replace advice given to you by your health care provider. Make sure you discuss any questions you have with your health care provider. Document Released: 08/25/2015 Document Revised: 04/17/2016 Document Reviewed: 05/30/2015 Elsevier Interactive Patient Education  2017 [place] Prevention in the Home Falls can cause injuries. They can happen to people of all ages. There are many things you can do to make your home safe and to help prevent falls. What can I do on the outside of my home?  Regularly fix the edges of walkways and driveways and fix any cracks.  Remove anything that might make you trip as you walk through a door, such as a raised step or threshold.  Trim any bushes or trees on the path to your home.  Use bright outdoor lighting.  Clear any walking paths of anything that might make someone trip, such as rocks or tools.  Regularly check to see if handrails are loose or broken. Make sure that both sides of any steps have handrails.  Any raised decks and porches should have guardrails on the edges.  Have any leaves, snow, or ice cleared regularly.  Use sand or salt on walking paths during winter.  Clean up any spills in your garage right away. This includes oil or grease spills. What can I do in the bathroom?  Use night lights.  Install grab bars by the toilet and in the tub and shower. Do not use towel bars as grab bars.  Use non-skid mats or decals in the tub or shower.  If you need to sit down in the shower, use a plastic, non-slip stool.  Keep the floor dry. Clean up any water that spills on the floor as soon as it happens.  Remove soap buildup in the tub or shower regularly.  Attach bath mats securely with double-sided non-slip rug tape.  Do not have throw rugs  and other things on the floor that can make you trip. What can I do in the bedroom?  Use night lights.  Make sure that you have a light by your bed that is easy to reach.  Do not use any sheets or blankets that are too big for your bed. They should not hang down onto the floor.  Have a firm chair that has side arms. You can use this for support while you get dressed.  Do not have throw rugs and other things on the floor that can make you trip. What can I do in the kitchen?  Clean up any spills right away.  Avoid walking on wet floors.  Keep items that you use a lot in easy-to-reach places.  If you need to reach something above you, use a strong step stool that has a grab bar.  Keep electrical cords out of the way.  Do not use floor polish or wax that makes floors slippery. If you must use wax, use non-skid floor wax.  Do not have throw rugs and other things on the floor that can make you trip. What can I do with my stairs?  Do not leave  any items on the stairs.  Make sure that there are handrails on both sides of the stairs and use them. Fix handrails that are broken or loose. Make sure that handrails are as long as the stairways.  Check any carpeting to make sure that it is firmly attached to the stairs. Fix any carpet that is loose or worn.  Avoid having throw rugs at the top or bottom of the stairs. If you do have throw rugs, attach them to the floor with carpet tape.  Make sure that you have a light switch at the top of the stairs and the bottom of the stairs. If you do not have them, ask someone to add them for you. What else can I do to help prevent falls?  Wear shoes that:  Do not have high heels.  Have rubber bottoms.  Are comfortable and fit you well.  Are closed at the toe. Do not wear sandals.  If you use a stepladder:  Make sure that it is fully opened. Do not climb a closed stepladder.  Make sure that both sides of the stepladder are locked into  place.  Ask someone to hold it for you, if possible.  Clearly mark and make sure that you can see:  Any grab bars or handrails.  First and last steps.  Where the edge of each step is.  Use tools that help you move around (mobility aids) if they are needed. These include:  Canes.  Walkers.  Scooters.  Crutches.  Turn on the lights when you go into a dark area. Replace any light bulbs as soon as they burn out.  Set up your furniture so you have a clear path. Avoid moving your furniture around.  If any of your floors are uneven, fix them.  If there are any pets around you, be aware of where they are.  Review your medicines with your doctor. Some medicines can make you feel dizzy. This can increase your chance of falling. Ask your doctor what other things that you can do to help prevent falls. This information is not intended to replace advice given to you by your health care provider. Make sure you discuss any questions you have with your health care provider. Document Released: 05/25/2009 Document Revised: 01/04/2016 Document Reviewed: 09/02/2014 Elsevier Interactive Patient Education  2017 [employer].

## 2019-01-21 NOTE — Progress Notes (Signed)
Subjective:   [name] is a 73 y.o. female who presents for Medicare Annual (Subsequent) preventive examination.  Review of Systems:   Cardiac Risk Factors include: advanced age (>3mn, >>9women);diabetes mellitus;obesity (BMI >30kg/m2);sedentary lifestyle;family history of premature cardiovascular disease;dyslipidemia;hypertension     Objective:     Vitals: There were no vitals taken for this visit.  There is no height or weight on file to calculate BMI.  Advanced Directives 01/21/2019 01/08/2019 08/28/2018 01/22/2018 01/16/2018 02/15/2014 11/17/2011  Does Patient Have a Medical Advance Directive? Yes Yes Yes No Yes Patient has advance directive, copy not in chart Patient does not have advance directive;Patient would not like information  Type of Advance Directive Healthcare Power of [place]-  Does patient want to make changes to medical advance directive? - - - - Yes (MAU/Ambulatory/Procedural Areas - Information given) No change requested -  Copy of [place] Chart? No - copy requested - No - copy requested - No - copy requested Copy requested from family -  Pre-existing out of facility DNR order (yellow form or pink MOST form) - - - - - No -    Tobacco Social History   Tobacco Use  Smoking Status Former Smoker  . Packs/day: 0.25  . Years: 15.00  . Pack years: 3.75  . Types: Cigarettes  . Quit date: 08/12/1976  . Years since quitting: 42.4  Smokeless Tobacco Never Used     Counseling given: Not Answered   Clinical Intake:  Pre-visit preparation completed: Yes  Pain : No/denies pain     BMI - recorded: 41.97 Nutritional Status: BMI > 30  Obese Nutritional Risks: None Diabetes: Yes CBG done?: No Did pt. bring in CBG monitor from home?: No  How often do you need to have someone help you when you read instructions, pamphlets, or other  written materials from your doctor or pharmacy?: 5 - Always(legally blind) What is the last grade level you completed in school?: 12th grade  Interpreter Needed?: Yes     Past Medical History:  Diagnosis Date  . Anemia of chronic disease   . Arthritis   . Back pain, chronic   . Blind   . Candida infection of mouth   . Diabetes mellitus   . Esophageal reflux   . GERD (gastroesophageal reflux disease)   . Hallucination   . Hand pain, left   . Hyperlipidemia   . Hypertension   . Hypertensive renal disease   . Insomnia   . Morbid obesity ([facility]   . OAB (overactive bladder)   . Osteoarthritis    left shoulder  . Poor circulation   . PVD (peripheral vascular disease) ([facility]   . Retinal detachment 11/2012  . Senile dementia with delusional features ([facility]   . Senile purpura ([facility]   . Vitamin D deficiency    Past Surgical History:  Procedure Laterality Date  . CATARACT EXTRACTION, BILATERAL    . INCISION AND DRAINAGE PERIRECTAL ABSCESS  11/20/2011   Procedure: IRRIGATION AND DEBRIDEMENT PERIRECTAL ABSCESS;  Surgeon: BZenovia Jarred MD;  [facility] [facility]  Service: General;  Laterality: Right;  Incision and Drainage of Right Groin Abscess   Family History  Problem Relation Age of Onset  . Heart disease Mother   . Diabetes Mother   . Hyperlipidemia Son   . Hypertension Son    Social History   Socioeconomic History  .  Marital status: Divorced    Spouse name: Not on file  . Number of children: Not on file  . Years of education: Not on file  . Highest education level: Not on file  Occupational History  . Not on file  Social Needs  . Financial resource strain: Not hard at all  . Food insecurity    Worry: Never true    Inability: Never true  . Transportation needs    Medical: No    Non-medical: No  Tobacco Use  . Smoking status: Former Smoker    Packs/day: 0.25    Years: 15.00    Pack years: 3.75    Types: Cigarettes    Quit date: 08/12/1976    Years since  quitting: 42.4  . Smokeless tobacco: Never Used  Substance and Sexual Activity  . Alcohol use: No  . Drug use: No  . Sexual activity: Not Currently  Lifestyle  . Physical activity    Days per week: 0 days    Minutes per session: 0 min  . Stress: Not at all  Relationships  . Social connections    Talks on phone: More than three times a week    Gets together: More than three times a week    Attends religious service: Never    Active member of club or organization: No    Attends meetings of clubs or organizations: Never    Relationship status: Divorced  Other Topics Concern  . Not on file  Social History Narrative   Social History      Diet? none      Do you drink/eat things with caffeine? Yes, coffee and tea      Marital status?                 divorced                   What year were you married?      Do you live in a house, apartment, assisted living, condo, trailer, etc.? house      Is it one or more stories? yes      How many persons live in your home? 4- lives with daughter in law and son and granddaughter      Do you have any pets in your home? (please list) 1 dog      Highest level of education completed? 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                 no                     Type & how often? n/a      Advanced Directives      Do you have a living will? no      Do you have a DNR form?                                  If not, do you want to discuss one? no      Do you have signed POA/HPOA for forms? yes      Functional Status      Do you have difficulty bathing or dressing yourself? yes      Do you have difficulty preparing food or eating? yes  Do you have difficulty managing your medications? yes      Do you have difficulty managing your finances? yes      Do you have difficulty affording your medications? yes    Outpatient Encounter Medications as of  01/21/2019  Medication Sig  . ACCU-CHEK FASTCLIX LANCETS MISC by Does not apply route. Check blood sugar 2 times daily  . amLODipine (NORVASC) 10 MG tablet Take 1 tablet (10 mg total) by mouth daily.  Marland Kitchen aspirin EC 81 MG tablet Take 81 mg by mouth daily.  Marland Kitchen atorvastatin (LIPITOR) 20 MG tablet TAKE 1 TABLET(20 MG) BY MOUTH DAILY AT 6 PM  . Blood Glucose Monitoring Suppl (ACCU-CHEK AVIVA PLUS) w/Device KIT Use to test Blood sugar three times daily. Dx: E11.40  . CALCIUM PO Take 1,000 mg by mouth 2 (two) times daily.   . cilostazol (PLETAL) 100 MG tablet Take 0.5 tablets (50 mg total) by mouth every morning.  . docusate sodium (COLACE) 100 MG capsule Take 100 mg by mouth 2 (two) times daily. Stool softener  . esomeprazole (NEXIUM) 20 MG capsule Take 20 mg by mouth every morning.  Marland Kitchen glucose blood test strip Use to test blood sugar three times daily. Dx: E11.40  . insulin glargine (LANTUS) 100 UNIT/ML injection Inject 0.44 mLs (44 Units total) into the skin at bedtime.  . Insulin Syringe-Needle U-100 (INSULIN SYRINGE 1CC/31GX5/16") 31G X 5/16" 1 ML MISC 1 Syringe by Does not apply route at bedtime. Dx: E11.40  . IRON PO Take 1 tablet by mouth every evening.   . labetalol (NORMODYNE) 100 MG tablet TAKE 1 TABLET(100 MG) BY MOUTH TWICE DAILY  . metFORMIN (GLUCOPHAGE) 1000 MG tablet TAKE 1 TABLET(1000 MG) BY MOUTH DAILY WITH BREAKFAST  . olmesartan (BENICAR) 40 MG tablet Take 1 tablet (40 mg total) by mouth daily.  Marland Kitchen torsemide (DEMADEX) 20 MG tablet Take 0.5 tablets (10 mg total) by mouth every morning.  . traMADol (ULTRAM) 50 MG tablet Take 50 mg by mouth 2 (two) times daily as needed.  Marland Kitchen amoxicillin-clavulanate (AUGMENTIN) 875-125 MG tablet Take 1 tablet by mouth 2 (two) times daily. (Patient not taking: Reported on 01/21/2019)   No facility-administered encounter medications on file as of 01/21/2019.     Activities of Daily Living In your present state of health, do you have any difficulty performing  the following activities: 01/21/2019  Hearing? Y  Vision? Y  Difficulty concentrating or making decisions? N  Walking or climbing stairs? Y  Dressing or bathing? Y  Doing errands, shopping? Y  Preparing Food and eating ? Y  Using the Toilet? N  In the past six months, have you accidently leaked urine? Y  Do you have problems with loss of bowel control? Y  Managing your Medications? Y  Managing your Finances? Y  Housekeeping or managing your Housekeeping? Y  Some recent data might be hidden    Patient Care Team: Lauree Chandler, NP as PCP - General (Geriatric Medicine) Cameron Sprang, MD as Consulting Physician (Neurology)    Assessment:   This is a routine wellness examination for Japneet.  Exercise Activities and Dietary recommendations Current Exercise Habits: The patient does not participate in regular exercise at present, Exercise limited by: psychological condition(s);orthopedic condition(s)  Goals    . better management of diabetes. (pt-stated)     A1c <8 with diet modifications.        Fall Risk Fall Risk  01/21/2019 01/08/2019 08/28/2018 05/28/2018 03/23/2018  Falls in the past  year? 0 1 0 Yes No  Comment - - - - -  Number falls in past yr: 0 1 0 2 or more -  Injury with Fall? 0 0 0 No -  Risk for fall due to : - History of fall(s) - - -  Follow up - - - - -   Is the patient's home free of loose throw rugs in walkways, pet beds, electrical cords, etc?   yes      Grab bars in the bathroom? yes but not around toilet uses BSC      Handrails on the stairs?   yes      Adequate lighting?   yes   Timed Get Up and Go performed: na  Depression Screen PHQ 2/9 Scores 01/21/2019 01/08/2019 04/17/2018 01/16/2018  PHQ - 2 Score 1 0 - 1  PHQ- 9 Score - - - -  Exception Documentation - - Other- indicate reason in comment box -  Not completed - - call completed with caregiver -    u Cognitive Function MMSE - Mini Mental State Exam 12/01/2017  Orientation to time 4   Orientation to Place 3  Registration 3  Attention/ Calculation 5  Recall 3  Language- name 2 objects 2  Language- repeat 1  Language- follow 3 step command 3  Language- read & follow direction 0  Language-read & follow direction-comments Pt is blind  Write a sentence 0  Write a sentence-comments Pt is blind  Copy design 0  Copy design-comments Pt is blind  Total score 24     6CIT Screen 01/21/2019  What Year? 0 points  What month? 0 points  What time? 0 points  Count back from 20 0 points  Months in reverse 0 points  Repeat phrase 2 points  Total Score 2    Immunization History  Administered Date(s) Administered  . Influenza, High Dose Seasonal PF 05/28/2018  . Influenza-Unspecified 05/12/2014, 04/08/2017  . Pneumococcal Conjugate-13 12/19/2014  . Pneumococcal Polysaccharide-23 09/12/2001, 10/08/2012    Qualifies for Shingles Vaccine?yes however pt has declined   Screening Tests Health Maintenance  Topic Date Due  . TETANUS/TDAP  12/07/1962  . DEXA SCAN  12/06/2008  . INFLUENZA VACCINE  03/13/2019  . HEMOGLOBIN A1C  07/17/2019  . FOOT EXAM  08/29/2019  . Fecal DNA (Cologuard)  02/12/2021  . PNA vac Low Risk Adult  Completed  . OPHTHALMOLOGY EXAM  Discontinued  . Hepatitis C Screening  Discontinued    Cancer Screenings: Lung: Low Dose CT Chest recommended if Age 54-80 years, 30 pack-year currently smoking OR have quit w/in 15years. Patient does not qualify. Breast:  Up to date on Mammogram? Yes  Up to date of Bone Density/Dexa? No Colorectal: up to date  Additional Screenings:  Hepatitis C Screening: pt declines.  podiatrist following feet due to diabetes    Plan:      I have personally reviewed and noted the following in the patient's chart:   . Medical and social history . Use of alcohol, tobacco or illicit drugs  . Current medications and supplements . Functional ability and status . Nutritional status . Physical activity . Advanced directives  . List of other physicians . Hospitalizations, surgeries, and ER visits in previous 12 months . Vitals . Screenings to include cognitive, depression, and falls . Referrals and appointments  In addition, I have reviewed and discussed with patient certain preventive protocols, quality metrics, and best practice recommendations. A written personalized care plan for preventive services  as well as general preventive health recommendations were provided to patient.     Lauree Chandler, NP  01/21/2019

## 2019-01-21 NOTE — Progress Notes (Signed)
This service is provided via telemedicine  No vital signs collected/recorded due to the encounter was a telemedicine visit.   Location of patient (ex: home, work): Home  Patient consents to a telephone visit: Yes  Location of the provider (ex: office, home):  [facility], Office   Name of any referring provider:  N/A  Names of all persons participating in the telemedicine service and their role in the encounter:  S.Chrae B/CMA, Sherrie Mustache, NP, and Patient   Time spent on call:  12 min with medical assistant      Careteam: Patient Care Team: Lauree Chandler, NP as PCP - General (Geriatric Medicine) Cameron Sprang, MD as Consulting Physician (Neurology)  Advanced Directive information    Allergies  Allergen Reactions   Oxycodone Other (See Comments)    "I'm swimming; I'm floating; I'm diving; I'm in another world."   Zyprexa [Olanzapine]     Increase in hallucination     Chief Complaint  Patient presents with   Follow-up    2 week blood sugar follow-up. Telephone visit      HPI: Patient is a 73 y.o. female for diabetes/blood sugar review a1c of 9.2! Daughter reports blood sugars had been trending up so she knew A1c would be elevated Blood sugars since 01/11/2019 Mornings- 61 (has not seen a low in "forever"), 110, 140, 121, 137, 92, 121, 124 PM- 363, 181, 335, 230, 222, 281, 274 Taking lantus 44 units at bedtime and metformin 1000 mg daily  hallucinations got worse and then she started eating more.   Since they got the A1c results they have made diet changes, took away snacks. Drinks some sweet drinks. Has decreased candy- has been giving her snickers and other candies and cookies.   Plans to hire a home health aide to help.   UTI- does complain about burning in stomach/lower abdomen, plans to pick up medication and start today.   Review of Systems:  Review of Systems  Constitutional: Negative for chills, fever and weight loss.  HENT:  Negative for tinnitus.   Eyes:       Legally blind  Respiratory: Negative for cough, sputum production and shortness of breath.   Cardiovascular: Negative for chest pain, palpitations and leg swelling.  Gastrointestinal: Positive for abdominal pain and heartburn (with certain foods). Negative for constipation and diarrhea.  Genitourinary: Positive for dysuria. Negative for frequency and urgency.  Musculoskeletal: Negative for back pain, falls, joint pain and myalgias.  Skin: Negative.   Neurological: Negative for dizziness, tremors, weakness and headaches.  Psychiatric/Behavioral: Positive for depression and memory loss. Negative for suicidal ideas. The patient is not nervous/anxious and does not have insomnia.     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 Her smoking use included cigarettes. She has a 3.75 pack-year smoking history. She has never used smokeless tobacco. She reports that  she does not drink alcohol or use drugs.  Family History  Problem Relation Age of Onset   Heart disease Mother    Diabetes Mother    Hyperlipidemia Son    Hypertension Son     Medications: Patient's Medications  New Prescriptions   No medications on file  Previous Medications   ACCU-CHEK FASTCLIX LANCETS MISC    by Does not apply route. Check blood sugar 2 times daily   AMLODIPINE (NORVASC) 10 MG TABLET    Take 1 tablet (10 mg total) by mouth daily.   AMOXICILLIN-CLAVULANATE (AUGMENTIN) 875-125 MG TABLET    Take 1 tablet by mouth 2 (two) times daily.   ASPIRIN EC 81 MG TABLET    Take 81 mg by mouth daily.   ATORVASTATIN (LIPITOR) 20 MG TABLET    TAKE 1 TABLET(20 MG) BY MOUTH DAILY AT 6 PM   BLOOD GLUCOSE MONITORING SUPPL (ACCU-CHEK AVIVA PLUS) W/DEVICE KIT    Use to test Blood sugar three times daily. Dx: E11.40   CALCIUM PO    Take 1,000 mg by mouth 2 (two) times daily.    CILOSTAZOL (PLETAL) 100 MG TABLET    Take 0.5 tablets (50 mg total) by mouth every morning.   DOCUSATE SODIUM (COLACE) 100 MG CAPSULE    Take 100 mg by mouth 2 (two) times daily. Stool softener   ESOMEPRAZOLE (NEXIUM) 20 MG CAPSULE    Take 20 mg by mouth every morning.   GLUCOSE BLOOD TEST STRIP    Use to test blood sugar three times daily. Dx: E11.40   INSULIN GLARGINE (LANTUS) 100 UNIT/ML INJECTION    Inject 0.44 mLs (44 Units total) into the skin at bedtime.   INSULIN SYRINGE-NEEDLE U-100 (INSULIN SYRINGE 1CC/31GX5/16") 31G X 5/16" 1 ML MISC    1 Syringe by Does not apply route at bedtime. Dx: E11.40   IRON PO    Take 1 tablet by mouth every evening.    LABETALOL (NORMODYNE) 100 MG TABLET    TAKE 1 TABLET(100 MG) BY MOUTH TWICE DAILY   METFORMIN (GLUCOPHAGE) 1000 MG TABLET    TAKE 1 TABLET(1000 MG) BY MOUTH DAILY WITH BREAKFAST   OLMESARTAN (BENICAR) 40 MG TABLET    Take 1 tablet (40 mg total) by mouth daily.   TORSEMIDE (DEMADEX) 20 MG TABLET    Take 0.5 tablets (10 mg total) by mouth every morning.    TRAMADOL (ULTRAM) 50 MG TABLET    Take 50 mg by mouth 2 (two) times daily as needed.  Modified Medications   No medications on file  Discontinued Medications   No medications on file    Physical Exam:  There were no vitals filed for this visit. There is no height or weight on file to calculate BMI. Wt Readings from Last 3 Encounters:  08/28/18 260 lb (117.9 kg)  05/28/18 267 lb (121.1 kg)  03/24/18 269 lb 6.4 oz (122.2 kg)  Labs reviewed: Basic Metabolic Panel: Recent Labs    05/25/18 0852 08/24/18 0858 01/15/19 1024  NA 146 141 141  K 5.1 4.5 4.4  CL 111* 107 107  CO2 '29 24 27  ' GLUCOSE 100* 119* 116*  BUN 20 28* 28*  CREATININE 1.21* 1.38* 1.52*  CALCIUM 10.0 10.5* 10.0  TSH  --   --  3.69   Liver Function Tests: Recent Labs    04/16/18 1157 05/25/18 0852 01/15/19 1024  AST 45* 14 11  ALT '28 12 10  ' ALKPHOS 50  --   --   BILITOT 0.5 0.3 0.4  PROT 6.0* 6.0* 6.0*  ALBUMIN 3.1*  --   --    No results for input(s): LIPASE, AMYLASE in the last 8760 hours. No results for input(s): AMMONIA in the last 8760 hours. CBC: Recent Labs    04/16/18 1157 08/24/18 0858 01/15/19 1024  WBC 5.3 4.6 4.3  NEUTROABS 3.5 1,753 1,699  HGB 10.8* 10.7* 11.1*  HCT 34.3* 32.7* 33.8*  MCV 94.0 90.1 90.1  PLT 208 237 244   Lipid Panel: Recent Labs    05/25/18 0852 01/15/19 1024  CHOL 117 125  HDL 59 65  LDLCALC 45 46  TRIG 44 56  CHOLHDL 2.0 1.9   TSH: Recent Labs    01/15/19 1024  TSH 3.69   A1C: Lab Results  Component Value Date   HGBA1C 9.2 (H) 01/15/2019     Assessment/Plan 1. Type 2 diabetes mellitus with diabetic neuropathy, with long-term current use of insulin ([place]) Went into detail about dietary modifications to help control blood sugars. Fasting blood sugars smell well controlled but after meals are not. Discussed additional medication to help postprandial blood sugars but pt and family would like dietary modifications first before additional  medications.  Encouraged dietary compliance, routine foot care/monitoring and to keep up with diabetic eye exams through ophthalmology  -plans to make appt with Dr Gershon Crane for ophthalmology follow up, she is blind.  - Hemoglobin A1c; Future - COMPLETE METABOLIC PANEL WITH GFR; Future  2. Urinary tract infection without hematuria, site unspecified Having more hallucinations and abdominal discomfort, UA C&S revealed UTI, encouraged to increase hydration and take medication as prescribed at this time.   04/20/2019 for follow up [place]. Harle Battiest  [facility] [phone]    Virtual Visit via Telephone Note  I connected with pt on 01/21/19 at 10:30 AM EDT by telephone and verified that I am speaking with the correct person using two identifiers.  Location: Patient: home Provider: office    I discussed the limitations, risks, security and privacy concerns of performing an evaluation and management service by telephone and the availability of in person appointments. I also discussed with the patient that there may be a patient responsible charge related to this service. The patient expressed understanding and agreed to proceed.   I discussed the assessment and treatment plan with the patient. The patient was provided an opportunity to ask questions and all were answered. The patient agreed with the plan and demonstrated an understanding of the instructions.   The patient was advised to call back or seek an in-person evaluation if the symptoms worsen or if the condition fails to improve as anticipated.  I provided 25 minutes of non-face-to-face time during this encounter.  Carlos American. Harle Battiest Avs printed and mailed

## 2019-02-06 DIAGNOSIS — R269 Unspecified abnormalities of gait and mobility: Secondary | ICD-10-CM | POA: Diagnosis not present

## 2019-02-06 DIAGNOSIS — F0390 Unspecified dementia without behavioral disturbance: Secondary | ICD-10-CM | POA: Diagnosis not present

## 2019-02-06 DIAGNOSIS — R531 Weakness: Secondary | ICD-10-CM | POA: Diagnosis not present

## 2019-02-06 DIAGNOSIS — M545 Low back pain: Secondary | ICD-10-CM | POA: Diagnosis not present

## 2019-02-09 ENCOUNTER — Other Ambulatory Visit: Payer: Self-pay | Admitting: Nurse Practitioner

## 2019-02-09 DIAGNOSIS — N183 Chronic kidney disease, stage 3 unspecified: Secondary | ICD-10-CM

## 2019-02-09 DIAGNOSIS — E0822 Diabetes mellitus due to underlying condition with diabetic chronic kidney disease: Secondary | ICD-10-CM

## 2019-03-03 ENCOUNTER — Other Ambulatory Visit: Payer: Self-pay | Admitting: Nurse Practitioner

## 2019-03-03 DIAGNOSIS — I10 Essential (primary) hypertension: Secondary | ICD-10-CM

## 2019-04-04 ENCOUNTER — Other Ambulatory Visit: Payer: Self-pay | Admitting: Nurse Practitioner

## 2019-04-20 ENCOUNTER — Other Ambulatory Visit: Payer: Self-pay

## 2019-04-20 ENCOUNTER — Other Ambulatory Visit: Payer: Medicare HMO

## 2019-04-20 DIAGNOSIS — E1140 Type 2 diabetes mellitus with diabetic neuropathy, unspecified: Secondary | ICD-10-CM

## 2019-04-20 DIAGNOSIS — Z794 Long term (current) use of insulin: Secondary | ICD-10-CM | POA: Diagnosis not present

## 2019-04-21 LAB — COMPLETE METABOLIC PANEL WITH GFR
AG Ratio: 1.4 (calc) (ref 1.0–2.5)
ALT: 13 U/L (ref 6–29)
AST: 13 U/L (ref 10–35)
Albumin: 3.4 g/dL — ABNORMAL LOW (ref 3.6–5.1)
Alkaline phosphatase (APISO): 61 U/L (ref 37–153)
BUN/Creatinine Ratio: 18 (calc) (ref 6–22)
BUN: 25 mg/dL (ref 7–25)
CO2: 29 mmol/L (ref 20–32)
Calcium: 10 mg/dL (ref 8.6–10.4)
Chloride: 109 mmol/L (ref 98–110)
Creat: 1.4 mg/dL — ABNORMAL HIGH (ref 0.60–0.93)
GFR, Est African American: 42 mL/min/{1.73_m2} — ABNORMAL LOW (ref >=60)
GFR, Est Non African American: 37 mL/min/{1.73_m2} — ABNORMAL LOW (ref >=60)
Globulin: 2.5 g/dL (calc) (ref 1.9–3.7)
Glucose, Bld: 92 mg/dL (ref 65–99)
Potassium: 4.8 mmol/L (ref 3.5–5.3)
Sodium: 143 mmol/L (ref 135–146)
Total Bilirubin: 0.3 mg/dL (ref 0.2–1.2)
Total Protein: 5.9 g/dL — ABNORMAL LOW (ref 6.1–8.1)

## 2019-04-21 LAB — HEMOGLOBIN A1C
Hgb A1c MFr Bld: 9 % of total Hgb — ABNORMAL HIGH (ref <5.7)
Mean Plasma Glucose: 212 (calc)
eAG (mmol/L): 11.7 (calc)

## 2019-04-23 ENCOUNTER — Ambulatory Visit: Payer: Medicare HMO | Admitting: Nurse Practitioner

## 2019-04-29 ENCOUNTER — Ambulatory Visit: Payer: Medicare HMO | Admitting: Nurse Practitioner

## 2019-05-21 ENCOUNTER — Ambulatory Visit: Payer: Medicare HMO | Admitting: Nurse Practitioner

## 2019-05-28 ENCOUNTER — Other Ambulatory Visit: Payer: Self-pay | Admitting: Nurse Practitioner

## 2019-05-28 DIAGNOSIS — E0822 Diabetes mellitus due to underlying condition with diabetic chronic kidney disease: Secondary | ICD-10-CM

## 2019-05-28 DIAGNOSIS — Z794 Long term (current) use of insulin: Secondary | ICD-10-CM

## 2019-05-28 DIAGNOSIS — I10 Essential (primary) hypertension: Secondary | ICD-10-CM

## 2019-05-28 DIAGNOSIS — E785 Hyperlipidemia, unspecified: Secondary | ICD-10-CM

## 2019-06-11 ENCOUNTER — Encounter: Payer: Self-pay | Admitting: Podiatry

## 2019-06-11 ENCOUNTER — Ambulatory Visit: Payer: Medicare HMO | Admitting: Podiatry

## 2019-06-11 ENCOUNTER — Other Ambulatory Visit: Payer: Self-pay

## 2019-06-11 DIAGNOSIS — E1142 Type 2 diabetes mellitus with diabetic polyneuropathy: Secondary | ICD-10-CM | POA: Diagnosis not present

## 2019-06-11 DIAGNOSIS — B351 Tinea unguium: Secondary | ICD-10-CM

## 2019-06-11 DIAGNOSIS — M79676 Pain in unspecified toe(s): Secondary | ICD-10-CM

## 2019-06-11 NOTE — Patient Instructions (Signed)
Diabetes Mellitus and Foot Care Foot care is an important part of your health, especially when you have diabetes. Diabetes may cause you to have problems because of poor blood flow (circulation) to your feet and legs, which can cause your skin to:  Become thinner and drier.  Break more easily.  Heal more slowly.  Peel and crack. You may also have nerve damage (neuropathy) in your legs and feet, causing decreased feeling in them. This means that you may not notice minor injuries to your feet that could lead to more serious problems. Noticing and addressing any potential problems early is the best way to prevent future foot problems. How to care for your feet Foot hygiene  Wash your feet daily with warm water and mild soap. Do not use hot water. Then, pat your feet and the areas between your toes until they are completely dry. Do not soak your feet as this can dry your skin.  Trim your toenails straight across. Do not dig under them or around the cuticle. File the edges of your nails with an emery board or nail file.  Apply a moisturizing lotion or petroleum jelly to the skin on your feet and to dry, brittle toenails. Use lotion that does not contain alcohol and is unscented. Do not apply lotion between your toes. Shoes and socks  Wear clean socks or stockings every day. Make sure they are not too tight. Do not wear knee-high stockings since they may decrease blood flow to your legs.  Wear shoes that fit properly and have enough cushioning. Always look in your shoes before you put them on to be sure there are no objects inside.  To break in new shoes, wear them for just a few hours a day. This prevents injuries on your feet. Wounds, scrapes, corns, and calluses  Check your feet daily for blisters, cuts, bruises, sores, and redness. If you cannot see the bottom of your feet, use a mirror or ask someone for help.  Do not cut corns or calluses or try to remove them with medicine.  If you  find a minor scrape, cut, or break in the skin on your feet, keep it and the skin around it clean and dry. You may clean these areas with mild soap and water. Do not clean the area with peroxide, alcohol, or iodine.  If you have a wound, scrape, corn, or callus on your foot, look at it several times a day to make sure it is healing and not infected. Check for: ? Redness, swelling, or pain. ? Fluid or blood. ? Warmth. ? Pus or a bad smell. General instructions  Do not cross your legs. This may decrease blood flow to your feet.  Do not use heating pads or hot water bottles on your feet. They may burn your skin. If you have lost feeling in your feet or legs, you may not know this is happening until it is too late.  Protect your feet from hot and cold by wearing shoes, such as at the beach or on hot pavement.  Schedule a complete foot exam at least once a year (annually) or more often if you have foot problems. If you have foot problems, report any cuts, sores, or bruises to your health care provider immediately. Contact a health care provider if:  You have a medical condition that increases your risk of infection and you have any cuts, sores, or bruises on your feet.  You have an injury that is not   healing.  You have redness on your legs or feet.  You feel burning or tingling in your legs or feet.  You have pain or cramps in your legs and feet.  Your legs or feet are numb.  Your feet always feel cold.  You have pain around a toenail. Get help right away if:  You have a wound, scrape, corn, or callus on your foot and: ? You have pain, swelling, or redness that gets worse. ? You have fluid or blood coming from the wound, scrape, corn, or callus. ? Your wound, scrape, corn, or callus feels warm to the touch. ? You have pus or a bad smell coming from the wound, scrape, corn, or callus. ? You have a fever. ? You have a red line going up your leg. Summary  Check your feet every day  for cuts, sores, red spots, swelling, and blisters.  Moisturize feet and legs daily.  Wear shoes that fit properly and have enough cushioning.  If you have foot problems, report any cuts, sores, or bruises to your health care provider immediately.  Schedule a complete foot exam at least once a year (annually) or more often if you have foot problems. This information is not intended to replace advice given to you by your health care provider. Make sure you discuss any questions you have with your health care provider. Document Released: 07/26/2000 Document Revised: 09/10/2017 Document Reviewed: 08/30/2016 Elsevier Patient Education  2020 [employer] Inc[employer]  

## 2019-06-12 ENCOUNTER — Encounter: Payer: Self-pay | Admitting: Podiatry

## 2019-06-12 NOTE — Progress Notes (Signed)
Subjective:  [name] presents to clinic today with cc of  painful, thick, discolored, elongated toenails 1-5 b/l that become tender and cannot cut because of thickness.  Pain is aggravated when wearing enclosed shoe gear.  She is accompanied by her son on today's visit. They voice no new pedal problems on today's visit.  Blood sugar this am was 88 mg/dl. Last A1c was 9.0%.  Current Outpatient Medications on File Prior to Visit  Medication Sig Dispense Refill  . ACCU-CHEK FASTCLIX LANCETS MISC by Does not apply route. Check blood sugar 2 times daily    . amLODipine (NORVASC) 10 MG tablet TAKE 1 TABLET(10 MG) BY MOUTH DAILY 90 tablet 1  . amoxicillin-clavulanate (AUGMENTIN) 875-125 MG tablet Take 1 tablet by mouth 2 (two) times daily. 14 tablet 0  . aspirin EC 81 MG tablet Take 81 mg by mouth daily.    Marland Kitchen atorvastatin (LIPITOR) 20 MG tablet TAKE 1 TABLET(20 MG) BY MOUTH DAILY AT 6 PM 90 tablet 1  . Blood Glucose Monitoring Suppl (ACCU-CHEK AVIVA PLUS) w/Device KIT Use to test Blood sugar three times daily. Dx: E11.40 1 kit 0  . CALCIUM PO Take 1,000 mg by mouth 2 (two) times daily.     . cilostazol (PLETAL) 100 MG tablet TAKE 1/2 TABLET BY MOUTH EVERY MORNING 45 tablet 1  . docusate sodium (COLACE) 100 MG capsule Take 100 mg by mouth 2 (two) times daily. Stool softener    . esomeprazole (NEXIUM) 20 MG capsule Take 20 mg by mouth every morning.    Marland Kitchen glucose blood (ACCU-CHEK GUIDE) test strip DX E11.8 check blood sugars three times daily as directed 100 strip 11  . insulin glargine (LANTUS) 100 UNIT/ML injection Inject 0.44 mLs (44 Units total) into the skin at bedtime. 10 mL 11  . Insulin Syringe-Needle U-100 (INSULIN SYRINGE 1CC/31GX5/16") 31G X 5/16" 1 ML MISC 1 Syringe by Does not apply route at bedtime. Dx: E11.40 100 each 11  . IRON PO Take 1 tablet by mouth every evening.     . labetalol (NORMODYNE) 100 MG tablet TAKE 1 TABLET(100 MG) BY MOUTH TWICE DAILY 180 tablet 1  .  metFORMIN (GLUCOPHAGE) 1000 MG tablet TAKE 1 TABLET(1000 MG) BY MOUTH DAILY WITH BREAKFAST 90 tablet 1  . olmesartan (BENICAR) 40 MG tablet TAKE 1 TABLET(40 MG) BY MOUTH DAILY 90 tablet 1  . torsemide (DEMADEX) 20 MG tablet TAKE 1/2 TABLET(10 MG) BY MOUTH EVERY MORNING 45 tablet 1  . traMADol (ULTRAM) 50 MG tablet Take 50 mg by mouth 2 (two) times daily as needed.     No current facility-administered medications on file prior to visit.      Allergies  Allergen Reactions  . Oxycodone Other (See Comments)    "I'm swimming; I'm floating; I'm diving; I'm in another world."  . Zyprexa [Olanzapine]     Increase in hallucination      Objective: There were no vitals filed for this visit. 73 yo AAF, morbidly obese, in NAD. AAO x 3. Hard of hearing. Physical Examination:  Vascular Examination: Capillary refill time immediate x 10 digits.  Palpable DP/PT pulses b/l.  Digital hair present b/l.  Pedal edema b/l feet. No increased warmth. No breaks in skin.  Skin temperature gradient WNL b/l.  Dermatological Examination: Skin with normal turgor, texture and tone b/l.  No open wounds b/l.  No interdigital macerations noted b/l.  Elongated, thick, discolored brittle toenails with subungual debris and pain on dorsal palpation of nailbeds  1-5 b/l.  Musculoskeletal Examination: Muscle strength 5/5 to all muscle groups b/l.  No pain, crepitus or joint discomfort with active/passive ROM.  Neurological Examination: Sensation diminshed b/l with 10 gram monofilament.  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 Toenails 1-5 b/l were debrided in length and girth without iatrogenic laceration. 2.  Continue soft, supportive shoe gear daily. 3.  Report any pedal injuries to medical professional. 4.  Follow up 3 months. 5.  Patient/POA to call should there be a question/concern in there interim.

## 2019-06-28 ENCOUNTER — Ambulatory Visit: Payer: Medicare HMO | Admitting: Nurse Practitioner

## 2019-07-14 ENCOUNTER — Other Ambulatory Visit: Payer: Medicare HMO

## 2019-07-14 ENCOUNTER — Other Ambulatory Visit: Payer: Self-pay

## 2019-07-14 ENCOUNTER — Telehealth: Payer: Self-pay

## 2019-07-14 DIAGNOSIS — N183 Chronic kidney disease, stage 3 unspecified: Secondary | ICD-10-CM

## 2019-07-14 DIAGNOSIS — Z794 Long term (current) use of insulin: Secondary | ICD-10-CM

## 2019-07-14 DIAGNOSIS — E1140 Type 2 diabetes mellitus with diabetic neuropathy, unspecified: Secondary | ICD-10-CM | POA: Diagnosis not present

## 2019-07-14 DIAGNOSIS — E785 Hyperlipidemia, unspecified: Secondary | ICD-10-CM

## 2019-07-14 NOTE — Telephone Encounter (Signed)
Orders placed.

## 2019-07-14 NOTE — Telephone Encounter (Signed)
She came in today for lab draw, but there were no orders.  Hassan Rowan drew A1c, CBC, CMP, but needs orders before 5:00 today, 07/14/19.

## 2019-07-15 LAB — CBC WITH DIFFERENTIAL/PLATELET
Absolute Monocytes: 292 cells/uL (ref 200–950)
Basophils Absolute: 39 cells/uL (ref 0–200)
Basophils Relative: 0.9 %
Eosinophils Absolute: 129 cells/uL (ref 15–500)
Eosinophils Relative: 3 %
HCT: 34.9 % — ABNORMAL LOW (ref 35.0–45.0)
Hemoglobin: 11.2 g/dL — ABNORMAL LOW (ref 11.7–15.5)
Lymphs Abs: 1815 cells/uL (ref 850–3900)
MCH: 29.5 pg (ref 27.0–33.0)
MCHC: 32.1 g/dL (ref 32.0–36.0)
MCV: 91.8 fL (ref 80.0–100.0)
MPV: 12.1 fL (ref 7.5–12.5)
Monocytes Relative: 6.8 %
Neutro Abs: 2025 cells/uL (ref 1500–7800)
Neutrophils Relative %: 47.1 %
Platelets: 221 10*3/uL (ref 140–400)
RBC: 3.8 10*6/uL (ref 3.80–5.10)
RDW: 13.6 % (ref 11.0–15.0)
Total Lymphocyte: 42.2 %
WBC: 4.3 10*3/uL (ref 3.8–10.8)

## 2019-07-15 LAB — COMPLETE METABOLIC PANEL WITH GFR
AG Ratio: 1.5 (calc) (ref 1.0–2.5)
ALT: 8 U/L (ref 6–29)
AST: 12 U/L (ref 10–35)
Albumin: 3.5 g/dL — ABNORMAL LOW (ref 3.6–5.1)
Alkaline phosphatase (APISO): 53 U/L (ref 37–153)
BUN/Creatinine Ratio: 16 (calc) (ref 6–22)
BUN: 24 mg/dL (ref 7–25)
CO2: 28 mmol/L (ref 20–32)
Calcium: 9.9 mg/dL (ref 8.6–10.4)
Chloride: 110 mmol/L (ref 98–110)
Creat: 1.49 mg/dL — ABNORMAL HIGH (ref 0.60–0.93)
GFR, Est African American: 39 mL/min/{1.73_m2} — ABNORMAL LOW (ref >=60)
GFR, Est Non African American: 34 mL/min/{1.73_m2} — ABNORMAL LOW (ref >=60)
Globulin: 2.3 g/dL (calc) (ref 1.9–3.7)
Glucose, Bld: 82 mg/dL (ref 65–99)
Potassium: 4.6 mmol/L (ref 3.5–5.3)
Sodium: 145 mmol/L (ref 135–146)
Total Bilirubin: 0.3 mg/dL (ref 0.2–1.2)
Total Protein: 5.8 g/dL — ABNORMAL LOW (ref 6.1–8.1)

## 2019-07-15 LAB — HEMOGLOBIN A1C
Hgb A1c MFr Bld: 9 % of total Hgb — ABNORMAL HIGH (ref <5.7)
Mean Plasma Glucose: 212 (calc)
eAG (mmol/L): 11.7 (calc)

## 2019-07-16 ENCOUNTER — Ambulatory Visit (INDEPENDENT_AMBULATORY_CARE_PROVIDER_SITE_OTHER): Payer: Medicare HMO | Admitting: Nurse Practitioner

## 2019-07-16 ENCOUNTER — Encounter: Payer: Self-pay | Admitting: Nurse Practitioner

## 2019-07-16 ENCOUNTER — Other Ambulatory Visit: Payer: Self-pay

## 2019-07-16 VITALS — BP 130/76 | HR 65 | Temp 97.5°F | Ht 66.0 in | Wt 271.0 lb

## 2019-07-16 DIAGNOSIS — N183 Chronic kidney disease, stage 3 unspecified: Secondary | ICD-10-CM | POA: Diagnosis not present

## 2019-07-16 DIAGNOSIS — R443 Hallucinations, unspecified: Secondary | ICD-10-CM | POA: Diagnosis not present

## 2019-07-16 DIAGNOSIS — Z23 Encounter for immunization: Secondary | ICD-10-CM | POA: Diagnosis not present

## 2019-07-16 DIAGNOSIS — K219 Gastro-esophageal reflux disease without esophagitis: Secondary | ICD-10-CM

## 2019-07-16 DIAGNOSIS — I10 Essential (primary) hypertension: Secondary | ICD-10-CM | POA: Diagnosis not present

## 2019-07-16 DIAGNOSIS — Z794 Long term (current) use of insulin: Secondary | ICD-10-CM

## 2019-07-16 DIAGNOSIS — G629 Polyneuropathy, unspecified: Secondary | ICD-10-CM

## 2019-07-16 DIAGNOSIS — E1140 Type 2 diabetes mellitus with diabetic neuropathy, unspecified: Secondary | ICD-10-CM | POA: Diagnosis not present

## 2019-07-16 NOTE — Progress Notes (Signed)
Careteam: Patient Care Team: Lauree Chandler, NP as PCP - General (Geriatric Medicine) Cameron Sprang, MD as Consulting Physician (Neurology)  Advanced Directive information    Allergies  Allergen Reactions   Oxycodone Other (See Comments)    "I'm swimming; I'm floating; I'm diving; I'm in another world."   Zyprexa [Olanzapine]     Increase in hallucination     Chief Complaint  Patient presents with   Medical Management of Chronic Issues    3 month follow-up and discuss labs(copy printed). Here with son    Hallucinations    Patient episodes of hallucinations    Immunizations    Flu vaccine today      HPI: Patient is a 73 y.o. female seen in the office today for routine follow up.   Dm- a1c not controlled. Did not bring blood sugars but will have low sugars in the morning. Pt with diabetic neuropathy, retinopthy (legally blind) and CKD. Ongoing neuropathy, without changes.   Protein and albumin low on labs, reports they have talked to a nutritionist before and another referral would not help.  Son states she eats what they eat- he feels like they are eating correctly.  Taking metformin 1000 mg by mouth daily and lantus 44 units at bedtime.   Hallucination- was scheduled with a psychiatrists but has not had appt yet.   Hyperlipidemia- at goal in June, does not follow dietary modifications.   htn- maintained on norvasc and olmesartan  gerd- controlled on nexium 20 mg   Review of Systems:  Review of Systems  Constitutional: Negative for chills, fever and weight loss.  HENT: Negative for tinnitus.   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  Respiratory: Negative for cough, sputum production and shortness of breath.   Cardiovascular: Negative for chest pain, palpitations and leg swelling.  Gastrointestinal: Negative for abdominal pain, constipation, diarrhea and heartburn.  Genitourinary: Negative for dysuria, frequency and  urgency.  Musculoskeletal: Positive for joint pain and myalgias. Negative for back pain and falls.  Skin: Negative.   Neurological: Positive for tingling. Negative for dizziness and headaches.  Psychiatric/Behavioral: Positive for memory loss. Negative for depression. The patient has insomnia.     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 Her smoking use included cigarettes. She  has a 3.75 pack-year smoking history. She has never used smokeless tobacco. She reports that she does not drink alcohol or use drugs.  Family History  Problem Relation Age of Onset   Heart disease Mother    Diabetes Mother    Hyperlipidemia Son    Hypertension Son     Medications: Patient's Medications  New Prescriptions   No medications on file  Previous Medications   ACCU-CHEK FASTCLIX LANCETS MISC    by Does not apply route. Check blood sugar 2 times daily   AMLODIPINE (NORVASC) 10 MG TABLET     TAKE 1 TABLET(10 MG) BY MOUTH DAILY   ASPIRIN EC 81 MG TABLET    Take 81 mg by mouth daily.   ATORVASTATIN (LIPITOR) 20 MG TABLET    TAKE 1 TABLET(20 MG) BY MOUTH DAILY AT 6 PM   BLOOD GLUCOSE MONITORING SUPPL (ACCU-CHEK AVIVA PLUS) W/DEVICE KIT    Use to test Blood sugar three times daily. Dx: E11.40   CALCIUM PO    Take 1,000 mg by mouth 2 (two) times daily.    CILOSTAZOL (PLETAL) 100 MG TABLET    TAKE 1/2 TABLET BY MOUTH EVERY MORNING   DOCUSATE SODIUM (COLACE) 100 MG CAPSULE    Take 100 mg by mouth 2 (two) times daily. Stool softener   ESOMEPRAZOLE (NEXIUM) 20 MG CAPSULE    Take 20 mg by mouth every morning.   GLUCOSE BLOOD (ACCU-CHEK GUIDE) TEST STRIP    DX E11.8 check blood sugars three times daily as directed   INSULIN GLARGINE (LANTUS) 100 UNIT/ML INJECTION    Inject 0.44 mLs (44 Units total) into the skin at bedtime.   INSULIN SYRINGE-NEEDLE U-100 (INSULIN SYRINGE 1CC/31GX5/16") 31G X 5/16" 1 ML MISC    1 Syringe by Does not apply route at bedtime. Dx: E11.40   IRON PO    Take 1 tablet by mouth every evening.    LABETALOL (NORMODYNE) 100 MG TABLET    TAKE 1 TABLET(100 MG) BY MOUTH TWICE DAILY   METFORMIN (GLUCOPHAGE) 1000 MG TABLET    TAKE 1 TABLET(1000 MG) BY MOUTH DAILY WITH BREAKFAST   OLMESARTAN (BENICAR) 40 MG TABLET    TAKE 1 TABLET(40 MG) BY MOUTH DAILY   TORSEMIDE (DEMADEX) 20 MG TABLET    TAKE 1/2 TABLET(10 MG) BY MOUTH EVERY MORNING   TRAMADOL (ULTRAM) 50 MG TABLET    Take 50 mg by mouth 2 (two) times daily as needed.  Modified Medications   No medications on file  Discontinued Medications   AMOXICILLIN-CLAVULANATE (AUGMENTIN) 875-125 MG TABLET    Take 1 tablet by mouth 2 (two) times daily.    Physical Exam:  Vitals:   07/16/19 1312  BP: 130/76  Pulse: 65  Temp: (!) 97.5 F (36.4 C)  TempSrc: Temporal  SpO2: 97%  Weight: 271 lb (122.9 kg)  Height: _0  (1.676 m)   Body mass index is 43.74 kg/m. Wt Readings from Last 3 Encounters:  07/16/19 271 lb (122.9 kg)    08/28/18 260 lb (117.9 kg)  05/28/18 267 lb (121.1 kg)    Physical Exam Constitutional:      General: She is not in acute distress.    Appearance: She is well-developed. She is not diaphoretic.  HENT:     Head: Normocephalic and atraumatic.  Eyes:     Comments: Right corneal clouding; blind in OU   Neck:     Musculoskeletal: Normal range of motion and neck supple.  Cardiovascular:  Rate and Rhythm: Normal rate and regular rhythm.     Heart sounds: Murmur present.  Pulmonary:     Effort: Pulmonary effort is normal.     Breath sounds: Normal breath sounds.  Abdominal:     General: Bowel sounds are normal.     Palpations: Abdomen is soft.  Musculoskeletal:        General: No tenderness.  Skin:    General: Skin is warm and dry.  Neurological:     Mental Status: She is alert. Mental status is at baseline.  Psychiatric:        Mood and Affect: Mood normal.     Labs reviewed: Basic Metabolic Panel: Recent Labs    01/15/19 1024 04/20/19 0936 07/14/19 1514  NA 141 143 145  K 4.4 4.8 4.6  CL 107 109 110  CO2 _0 GLUCOSE 116* 92 82  BUN 28* 25 24  CREATININE 1.52* 1.40* 1.49*  CALCIUM 10.0 10.0 9.9  TSH 3.69  --   --    Liver Function Tests: Recent Labs    01/15/19 1024 04/20/19 0936 07/14/19 1514  AST _1 ALT _2 BILITOT 0.4 0.3 0.3  PROT 6.0* 5.9* 5.8*   No results for input(s): LIPASE, AMYLASE in the last 8760 hours. No results for input(s): AMMONIA in the last 8760 hours. CBC: Recent Labs    08/24/18 0858 01/15/19 1024 07/14/19 1514  WBC 4.6 4.3 4.3  NEUTROABS 1,753 1,699 2,025  HGB 10.7* 11.1* 11.2*  HCT 32.7* 33.8* 34.9*  MCV 90.1 90.1 91.8  PLT 237 244 221   Lipid Panel: Recent Labs    01/15/19 1024  CHOL 125  HDL 65  LDLCALC 46  TRIG 56  CHOLHDL 1.9   TSH: Recent Labs    01/15/19 1024  TSH 3.69   A1C: Lab Results  Component Value Date   HGBA1C 9.0 (H) 07/14/2019     Assessment/Plan 1. Need for  influenza vaccination - Flu Vaccine QUAD High Dose(Fluad)  2. Type 2 diabetes mellitus with diabetic neuropathy, with long-term current use of insulin (HCC) -uncontrolled, again does not have blood sugar,  - Ambulatory referral to Endocrinology for further evaluation and treatment.  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  - discussed with the patient and son the pathophysiology of diabetes and the natural progression of the disease.  -stressed the importance of lifestyle changes including diet and exercise. -discussed complications associated with diabetes including retinopathy, nephropathy, neuropathy as well as increased risk of cardiovascular disease. We went over the benefit seen with glycemic control.  She does not appear to have control of her diet as her son report "she eats what we eat" discussed importance of dietary compliance for dm, htn, and hyperlipidemia.  -stressed importance of routine foot care/monitoring as well.   3. Gastroesophageal reflux disease without esophagitis Controlled with nexium, again stressed importance of diet  4. Hallucination -has appt scheduled with psychiatrist to manage. Currently not on medication as this has caused it to be worse in the past.   5. Neuropathy Ongoing but stable at this time.  6. Stage 3 chronic kidney disease, unspecified whether stage 3a or 3b CKD -stress importance of proper management of diabetes. Also Encouraged proper hydration and avoiding NSAIDs  7. Essential hypgy ertension, benign Controlled on norvasc, labetolol, and olmesartan. Again, she does not appear to have control of her diet as her son report "she eats what we eat" discussed importance  of dietary compliance for dm, htn, and hyperlipidemia.   Next appt: 4 months.  Carlos American. [place], [facility] [phone]

## 2019-07-16 NOTE — Patient Instructions (Signed)
Referral to endocrinology made Important to bring blood sugars with you to this visit.      DASH Eating Plan DASH stands for "Dietary Approaches to Stop Hypertension." The DASH eating plan is a healthy eating plan that has been shown to reduce high blood pressure (hypertension). It may also reduce your risk for type 2 diabetes, heart disease, and stroke. The DASH eating plan may also help with weight loss. What are tips for following this plan?  General guidelines  Avoid eating more than 2,300 mg (milligrams) of salt (sodium) a day. If you have hypertension, you may need to reduce your sodium intake to 1,500 mg a day.  Limit alcohol intake to no more than 1 drink a day for nonpregnant women and 2 drinks a day for men. One drink equals 12 oz of beer, 5 oz of wine, or 1 oz of hard liquor.  Work with your health care provider to maintain a healthy body weight or to lose weight. Ask what an ideal weight is for you.  Get at least 30 minutes of exercise that causes your heart to beat faster (aerobic exercise) most days of the week. Activities may include walking, swimming, or biking.  Work with your health care provider or diet and nutrition specialist (dietitian) to adjust your eating plan to your individual calorie needs. Reading food labels   Check food labels for the amount of sodium per serving. Choose foods with less than 5 percent of the Daily Value of sodium. Generally, foods with less than 300 mg of sodium per serving fit into this eating plan.  To find whole grains, look for the word "whole" as the first word in the ingredient list. Shopping  Buy products labeled as "low-sodium" or "no salt added."  Buy fresh foods. Avoid canned foods and premade or frozen meals. Cooking  Avoid adding salt when cooking. Use salt-free seasonings or herbs instead of table salt or sea salt. Check with your health care provider or pharmacist before using salt substitutes.  Do not fry foods. Cook  foods using healthy methods such as baking, boiling, grilling, and broiling instead.  Cook with heart-healthy oils, such as olive, canola, soybean, or sunflower oil. Meal planning  Eat a balanced diet that includes: ? 5 or more servings of fruits and vegetables each day. At each meal, try to fill half of your plate with fruits and vegetables. ? Up to 6-8 servings of whole grains each day. ? Less than 6 oz of lean meat, poultry, or fish each day. A 3-oz serving of meat is about the same size as a deck of cards. One egg equals 1 oz. ? 2 servings of low-fat dairy each day. ? A serving of nuts, seeds, or beans 5 times each week. ? Heart-healthy fats. Healthy fats called Omega-3 fatty acids are found in foods such as flaxseeds and coldwater fish, like sardines, salmon, and mackerel.  Limit how much you eat of the following: ? Canned or prepackaged foods. ? Food that is high in trans fat, such as fried foods. ? Food that is high in saturated fat, such as fatty meat. ? Sweets, desserts, sugary drinks, and other foods with added sugar. ? Full-fat dairy products.  Do not salt foods before eating.  Try to eat at least 2 vegetarian meals each week.  Eat more home-cooked food and less restaurant, buffet, and fast food.  When eating at a restaurant, ask that your food be prepared with less salt or no salt, if  possible. What foods are recommended? The items listed may not be a complete list. Talk with your dietitian about what dietary choices are best for you. Grains Whole-grain or whole-wheat bread. Whole-grain or whole-wheat pasta. Brown rice. [place]. Bulgur. Whole-grain and low-sodium cereals. Pita bread. Low-fat, low-sodium crackers. Whole-wheat flour tortillas. Vegetables Fresh or frozen vegetables (raw, steamed, roasted, or grilled). Low-sodium or reduced-sodium tomato and vegetable juice. Low-sodium or reduced-sodium tomato sauce and tomato paste. Low-sodium or reduced-sodium canned  vegetables. Fruits All fresh, dried, or frozen fruit. Canned fruit in natural juice (without added sugar). Meat and other protein foods Skinless chicken or [place]. Ground chicken or [place]. Pork with fat trimmed off. Fish and seafood. Egg whites. Dried beans, peas, or lentils. Unsalted nuts, nut butters, and seeds. Unsalted canned beans. Lean cuts of beef with fat trimmed off. Low-sodium, lean deli meat. Dairy Low-fat (1%) or fat-free (skim) milk. Fat-free, low-fat, or reduced-fat cheeses. Nonfat, low-sodium ricotta or cottage cheese. Low-fat or nonfat yogurt. Low-fat, low-sodium cheese. Fats and oils Soft margarine without trans fats. Vegetable oil. Low-fat, reduced-fat, or light mayonnaise and salad dressings (reduced-sodium). Canola, safflower, olive, soybean, and sunflower oils. Avocado. Seasoning and other foods Herbs. Spices. Seasoning mixes without salt. Unsalted popcorn and pretzels. Fat-free sweets. What foods are not recommended? The items listed may not be a complete list. Talk with your dietitian about what dietary choices are best for you. Grains Baked goods made with fat, such as croissants, muffins, or some breads. Dry pasta or rice meal packs. Vegetables Creamed or fried vegetables. Vegetables in a cheese sauce. Regular canned vegetables (not low-sodium or reduced-sodium). Regular canned tomato sauce and paste (not low-sodium or reduced-sodium). Regular tomato and vegetable juice (not low-sodium or reduced-sodium). Angie Fava. Olives. Fruits Canned fruit in a light or heavy syrup. Fried fruit. Fruit in cream or butter sauce. Meat and other protein foods Fatty cuts of meat. Ribs. Fried meat. Berniece Salines. Sausage. Bologna and other processed lunch meats. Salami. Fatback. Hotdogs. Bratwurst. Salted nuts and seeds. Canned beans with added salt. Canned or smoked fish. Whole eggs or egg yolks. Chicken or [place] with skin. Dairy Whole or 2% milk, cream, and half-and-half. Whole or full-fat  cream cheese. Whole-fat or sweetened yogurt. Full-fat cheese. Nondairy creamers. Whipped toppings. Processed cheese and cheese spreads. Fats and oils Butter. Stick margarine. Lard. Shortening. Ghee. Bacon fat. Tropical oils, such as coconut, palm kernel, or palm oil. Seasoning and other foods Salted popcorn and pretzels. Onion salt, garlic salt, seasoned salt, table salt, and sea salt. Worcestershire sauce. Tartar sauce. Barbecue sauce. Teriyaki sauce. Soy sauce, including reduced-sodium. Steak sauce. Canned and packaged gravies. Fish sauce. Oyster sauce. Cocktail sauce. Horseradish that you find on the shelf. Ketchup. Mustard. Meat flavorings and tenderizers. Bouillon cubes. Hot sauce and Tabasco sauce. Premade or packaged marinades. Premade or packaged taco seasonings. Relishes. Regular salad dressings. Where to find more information:  National Heart, Lung, and [place]: https://wilson-eaton.com/  American Heart Association: www.heart.org Summary  The DASH eating plan is a healthy eating plan that has been shown to reduce high blood pressure (hypertension). It may also reduce your risk for type 2 diabetes, heart disease, and stroke.  With the DASH eating plan, you should limit salt (sodium) intake to 2,300 mg a day. If you have hypertension, you may need to reduce your sodium intake to 1,500 mg a day.  When on the DASH eating plan, aim to eat more fresh fruits and vegetables, whole grains, lean proteins, low-fat dairy, and heart-healthy fats.  Work with your health care provider or diet and nutrition specialist (dietitian) to adjust your eating plan to your individual calorie needs. This information is not intended to replace advice given to you by your health care provider. Make sure you discuss any questions you have with your health care provider. Document Released: 07/18/2011 Document Revised: 07/11/2017 Document Reviewed: 07/22/2016 Elsevier Patient Education  2020 [employer].

## 2019-08-19 ENCOUNTER — Telehealth: Payer: Self-pay | Admitting: *Deleted

## 2019-08-19 NOTE — Telephone Encounter (Signed)
Received FMLA Paperwork from [place], son.  Loletta Parish Fax: [phone] Case #: [id number] TC  Stating he provides ADL skills, Doctor Appointments, all financial needs and cooking and meal prep.  Placed in Jessica's folder to review, fill out and sign.

## 2019-08-23 DIAGNOSIS — Z029 Encounter for administrative examinations, unspecified: Secondary | ICD-10-CM

## 2019-09-13 DIAGNOSIS — E1140 Type 2 diabetes mellitus with diabetic neuropathy, unspecified: Secondary | ICD-10-CM

## 2019-09-13 DIAGNOSIS — Z794 Long term (current) use of insulin: Secondary | ICD-10-CM

## 2019-09-13 NOTE — Telephone Encounter (Signed)
Error

## 2019-09-15 ENCOUNTER — Other Ambulatory Visit: Payer: Self-pay | Admitting: *Deleted

## 2019-09-15 DIAGNOSIS — Z794 Long term (current) use of insulin: Secondary | ICD-10-CM

## 2019-09-15 DIAGNOSIS — E1140 Type 2 diabetes mellitus with diabetic neuropathy, unspecified: Secondary | ICD-10-CM

## 2019-09-15 MED ORDER — INSULIN GLARGINE 100 UNIT/ML ~~LOC~~ SOLN: 44.0000 [IU] | Freq: Every day | SUBCUTANEOUS | 6 refills | Status: DC

## 2019-09-15 NOTE — Telephone Encounter (Signed)
[facility] 

## 2019-09-17 ENCOUNTER — Ambulatory Visit: Payer: Medicare HMO | Admitting: Podiatry

## 2019-10-08 ENCOUNTER — Ambulatory Visit: Payer: Medicare HMO | Admitting: Podiatry

## 2019-10-08 ENCOUNTER — Other Ambulatory Visit: Payer: Self-pay

## 2019-10-08 VITALS — Temp 98.0°F

## 2019-10-08 DIAGNOSIS — E119 Type 2 diabetes mellitus without complications: Secondary | ICD-10-CM | POA: Diagnosis not present

## 2019-10-08 DIAGNOSIS — M79676 Pain in unspecified toe(s): Secondary | ICD-10-CM | POA: Diagnosis not present

## 2019-10-08 DIAGNOSIS — B351 Tinea unguium: Secondary | ICD-10-CM | POA: Diagnosis not present

## 2019-10-08 DIAGNOSIS — E1142 Type 2 diabetes mellitus with diabetic polyneuropathy: Secondary | ICD-10-CM

## 2019-10-08 NOTE — Patient Instructions (Signed)
Diabetes Mellitus and Foot Care Foot care is an important part of your health, especially when you have diabetes. Diabetes may cause you to have problems because of poor blood flow (circulation) to your feet and legs, which can cause your skin to:  Become thinner and drier.  Break more easily.  Heal more slowly.  Peel and crack. You may also have nerve damage (neuropathy) in your legs and feet, causing decreased feeling in them. This means that you may not notice minor injuries to your feet that could lead to more serious problems. Noticing and addressing any potential problems early is the best way to prevent future foot problems. How to care for your feet Foot hygiene  Wash your feet daily with warm water and mild soap. Do not use hot water. Then, pat your feet and the areas between your toes until they are completely dry. Do not soak your feet as this can dry your skin.  Trim your toenails straight across. Do not dig under them or around the cuticle. File the edges of your nails with an emery board or nail file.  Apply a moisturizing lotion or petroleum jelly to the skin on your feet and to dry, brittle toenails. Use lotion that does not contain alcohol and is unscented. Do not apply lotion between your toes. Shoes and socks  Wear clean socks or stockings every day. Make sure they are not too tight. Do not wear knee-high stockings since they may decrease blood flow to your legs.  Wear shoes that fit properly and have enough cushioning. Always look in your shoes before you put them on to be sure there are no objects inside.  To break in new shoes, wear them for just a few hours a day. This prevents injuries on your feet. Wounds, scrapes, corns, and calluses  Check your feet daily for blisters, cuts, bruises, sores, and redness. If you cannot see the bottom of your feet, use a mirror or ask someone for help.  Do not cut corns or calluses or try to remove them with medicine.  If you  find a minor scrape, cut, or break in the skin on your feet, keep it and the skin around it clean and dry. You may clean these areas with mild soap and water. Do not clean the area with peroxide, alcohol, or iodine.  If you have a wound, scrape, corn, or callus on your foot, look at it several times a day to make sure it is healing and not infected. Check for: ? Redness, swelling, or pain. ? Fluid or blood. ? Warmth. ? Pus or a bad smell. General instructions  Do not cross your legs. This may decrease blood flow to your feet.  Do not use heating pads or hot water bottles on your feet. They may burn your skin. If you have lost feeling in your feet or legs, you may not know this is happening until it is too late.  Protect your feet from hot and cold by wearing shoes, such as at the beach or on hot pavement.  Schedule a complete foot exam at least once a year (annually) or more often if you have foot problems. If you have foot problems, report any cuts, sores, or bruises to your health care provider immediately. Contact a health care provider if:  You have a medical condition that increases your risk of infection and you have any cuts, sores, or bruises on your feet.  You have an injury that is not   healing.  You have redness on your legs or feet.  You feel burning or tingling in your legs or feet.  You have pain or cramps in your legs and feet.  Your legs or feet are numb.  Your feet always feel cold.  You have pain around a toenail. Get help right away if:  You have a wound, scrape, corn, or callus on your foot and: ? You have pain, swelling, or redness that gets worse. ? You have fluid or blood coming from the wound, scrape, corn, or callus. ? Your wound, scrape, corn, or callus feels warm to the touch. ? You have pus or a bad smell coming from the wound, scrape, corn, or callus. ? You have a fever. ? You have a red line going up your leg. Summary  Check your feet every day  for cuts, sores, red spots, swelling, and blisters.  Moisturize feet and legs daily.  Wear shoes that fit properly and have enough cushioning.  If you have foot problems, report any cuts, sores, or bruises to your health care provider immediately.  Schedule a complete foot exam at least once a year (annually) or more often if you have foot problems. This information is not intended to replace advice given to you by your health care provider. Make sure you discuss any questions you have with your health care provider. Document Revised: 04/21/2019 Document Reviewed: 08/30/2016 Elsevier Patient Education  2020 [employer] Inc.  

## 2019-10-09 NOTE — Progress Notes (Addendum)
Subjective: [name] presents today for follow up of preventative diabetic foot care, for diabetic foot evaluation and painful mycotic nails b/l that are difficult to trim. Pain interferes with ambulation. Aggravating factors include wearing enclosed shoe gear. Pain is relieved with periodic professional debridement.   Allergies  Allergen Reactions  . Oxycodone Other (See Comments)    "I'm swimming; I'm floating; I'm diving; I'm in another world."  . Zyprexa [Olanzapine]     Increase in hallucination      Objective: Vitals:   10/08/19 0957  Temp: 98 F (36.7 C)    Vascular Examination:  Capillary refill time to digits immediate b/l, faintly palpable pedal pulses b/l, pedal hair absent b/l, skin temperature gradient within normal limits b/l and trace edema noted b/l feet  Dermatological Examination: Pedal skin with normal turgor, texture and tone bilaterally, no open wounds bilaterally, no interdigital macerations bilaterally and toenails 1-5 b/l elongated, dystrophic, thickened, crumbly with subungual debris  Musculoskeletal: Normal muscle strength 5/5 to all lower extremity muscle groups bilaterally, no gross bony deformities bilaterally and no pain crepitus or joint limitation noted with ROM b/l  Neurological: Protective sensation intact 5/5 intact bilaterally with 10g monofilament b/l and vibratory sensation decreased b/l  Assessment: 1. Pain due to onychomycosis of toenail   2. Diabetic peripheral neuropathy associated with type 2 diabetes mellitus (HCC)   3. Encounter for diabetic foot exam (HCC)    Plan: -Diabetic foot examination performed on today's visit.  -Continue diabetic foot care principles. Literature dispensed on today.   -Toenails 1-5 b/l were debrided in length and girth with sterile nail nippers and dremel without iatrogenic bleeding.  -Patient to continue soft, supportive shoe gear daily.  -Patient to report any pedal injuries to medical  professional immediately.  -Patient/POA to call should there be question/concern in the interim.  Return in about 3 months (around 01/05/2020) for diabetic nail trim.

## 2019-11-03 ENCOUNTER — Other Ambulatory Visit: Payer: Self-pay | Admitting: Nurse Practitioner

## 2019-11-19 ENCOUNTER — Encounter: Payer: Self-pay | Admitting: Nurse Practitioner

## 2019-11-19 ENCOUNTER — Other Ambulatory Visit: Payer: Self-pay

## 2019-11-19 ENCOUNTER — Ambulatory Visit (INDEPENDENT_AMBULATORY_CARE_PROVIDER_SITE_OTHER): Payer: Medicare HMO | Admitting: Nurse Practitioner

## 2019-11-19 VITALS — BP 132/78 | HR 63 | Temp 96.6°F | Ht 66.0 in | Wt 264.0 lb

## 2019-11-19 DIAGNOSIS — G629 Polyneuropathy, unspecified: Secondary | ICD-10-CM

## 2019-11-19 DIAGNOSIS — R0602 Shortness of breath: Secondary | ICD-10-CM

## 2019-11-19 DIAGNOSIS — N1832 Chronic kidney disease, stage 3b: Secondary | ICD-10-CM

## 2019-11-19 DIAGNOSIS — E1151 Type 2 diabetes mellitus with diabetic peripheral angiopathy without gangrene: Secondary | ICD-10-CM | POA: Diagnosis not present

## 2019-11-19 DIAGNOSIS — I739 Peripheral vascular disease, unspecified: Secondary | ICD-10-CM

## 2019-11-19 DIAGNOSIS — D631 Anemia in chronic kidney disease: Secondary | ICD-10-CM

## 2019-11-19 DIAGNOSIS — R443 Hallucinations, unspecified: Secondary | ICD-10-CM

## 2019-11-19 DIAGNOSIS — Z794 Long term (current) use of insulin: Secondary | ICD-10-CM | POA: Diagnosis not present

## 2019-11-19 DIAGNOSIS — E1122 Type 2 diabetes mellitus with diabetic chronic kidney disease: Secondary | ICD-10-CM | POA: Diagnosis not present

## 2019-11-19 DIAGNOSIS — E1140 Type 2 diabetes mellitus with diabetic neuropathy, unspecified: Secondary | ICD-10-CM | POA: Diagnosis not present

## 2019-11-19 DIAGNOSIS — E785 Hyperlipidemia, unspecified: Secondary | ICD-10-CM | POA: Diagnosis not present

## 2019-11-19 DIAGNOSIS — N183 Chronic kidney disease, stage 3 unspecified: Secondary | ICD-10-CM

## 2019-11-19 DIAGNOSIS — M542 Cervicalgia: Secondary | ICD-10-CM

## 2019-11-19 DIAGNOSIS — R079 Chest pain, unspecified: Secondary | ICD-10-CM

## 2019-11-19 DIAGNOSIS — Z6841 Body Mass Index (BMI) 40.0 and over, adult: Secondary | ICD-10-CM | POA: Diagnosis not present

## 2019-11-19 NOTE — Progress Notes (Signed)
Careteam: Patient Care Team: Lauree Chandler, NP as PCP - General (Geriatric Medicine) Cameron Sprang, MD as Consulting Physician (Neurology)  PLACE OF SERVICE:  [facility] Directive information Does Patient Have a Medical Advance Directive?: Yes, Type of Advance Directive: [place];Living will, Does patient want to make changes to medical advance directive?: No - Patient declined  Allergies  Allergen Reactions  . Oxycodone Other (See Comments)    "I'm swimming; I'm floating; I'm diving; I'm in another world."  . Zyprexa [Olanzapine]     Increase in hallucination     Chief Complaint  Patient presents with  . Medical Management of Chronic Issues    4 month follow-up. Here with [facility]   . Neck Pain    Onset Monday, constant pain  . Shortness of Breath    Patient c/o SOB x 3-4 months   . Immunizations    Discuss need for TD  . Quality Metric Gaps    Discuss need for DEXA      HPI: Patient is a 74 y.o. female for routine follow up.   Reports left sided neck pain for 5 days (son here said he has not heard about the pain) she states the pain is a 5/10, makes her nauseous at time.  Reports she just woke up 5 days ago and had the pain.  She has not tried anything for the pain. Reports pain is getting worse.  Able to move head without difficulty Reports she sleeps flat without a pillow- this has been ongoing.  DM- blood sugars in the morning 75-85, before bed in the 200s. Reports she does feel jittery in the morning because blood sugars will get too long and she was sweat. This morning she had to eat a fig bar because of it. Did not check her blood sugar this morning. consistent with eating breakfast, lunch and dinner. Diet "could be better" Taking metformin 1000 mg twice daily with lantus 44 units at bedtime.   Reports she gets shortness of breath of breath sometimes when she is taking or eating. Reports this is happening daily.  States she  has to stop eating to breath- son reports he has never seen her stop eating to breath. Son reports she talks on the phone frequently and does well with that. She is very talkative during the visit today and there was no shortness of breath at this time. No swelling in legs, no chest pains.  Walks to the bathroom three times a week (otherwise does not walk) and she does not have shortness of breath during that time. No pain when she is breathing.     Review of Systems:  Review of Systems  Constitutional: Negative for chills, fever and weight loss.  HENT: Negative for tinnitus.   Respiratory: Negative for cough, sputum production and shortness of breath.   Cardiovascular: Negative for chest pain, palpitations and leg swelling.  Gastrointestinal: Negative for abdominal pain, constipation, diarrhea and heartburn.  Genitourinary: Negative for dysuria, frequency and urgency.  Musculoskeletal: Negative for back pain, falls, joint pain and myalgias.  Skin: Negative.   Neurological: Negative for dizziness and headaches.  Psychiatric/Behavioral: Negative for depression and memory loss. The patient does not have insomnia.     Past Medical History:  Diagnosis Date  . Anemia of chronic disease   . Arthritis   . Back pain, chronic   . Blind   . Candida infection of mouth   . Diabetes mellitus   .  Esophageal reflux   . GERD (gastroesophageal reflux disease)   . Hallucination   . Hand pain, left   . Hyperlipidemia   . Hypertension   . Hypertensive renal disease   . Insomnia   . Morbid obesity ([place])   . OAB (overactive bladder)   . Osteoarthritis    left shoulder  . Poor circulation   . PVD (peripheral vascular disease) ([place])   . Retinal detachment 11/2012  . 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 Senile purpura ([place])   . Vitamin D deficiency    Past Surgical History:  Procedure Laterality Date  . CATARACT EXTRACTION, BILATERAL    . INCISION AND DRAINAGE PERIRECTAL ABSCESS   11/20/2011   Procedure: IRRIGATION AND DEBRIDEMENT PERIRECTAL ABSCESS;  Surgeon: Zenovia Jarred, MD;  Location: [facility];  Service: General;  Laterality: Right;  Incision and Drainage of Right Groin Abscess   Social History:   reports that she quit smoking about 43 years ago. Her smoking use included cigarettes. She has a 3.75 pack-year smoking history. She has never used smokeless tobacco. She reports that she does not drink alcohol or use drugs.  Family History  Problem Relation Age of Onset  . Heart disease Mother   . Diabetes Mother   . Hyperlipidemia Son   . Hypertension Son     Medications: Patient's Medications  New Prescriptions   No medications on file  Previous Medications   ACCU-CHEK FASTCLIX LANCETS MISC    by Does not apply route. Check blood sugar 2 times daily   AMLODIPINE (NORVASC) 10 MG TABLET    TAKE 1 TABLET(10 MG) BY MOUTH DAILY   ASPIRIN EC 81 MG TABLET    Take 81 mg by mouth daily.   ATORVASTATIN (LIPITOR) 20 MG TABLET    TAKE 1 TABLET(20 MG) BY MOUTH DAILY AT 6 PM   BLOOD GLUCOSE MONITORING SUPPL (ACCU-CHEK AVIVA PLUS) W/DEVICE KIT    Use to test Blood sugar three times daily. Dx: E11.40   CALCIUM PO    Take 1,000 mg by mouth 2 (two) times daily.    CILOSTAZOL (PLETAL) 100 MG TABLET    TAKE 1/2 TABLET BY MOUTH EVERY MORNING   DOCUSATE SODIUM (COLACE) 100 MG CAPSULE    Take 100 mg by mouth 2 (two) times daily. Stool softener   ESOMEPRAZOLE (NEXIUM) 20 MG CAPSULE    Take 20 mg by mouth every morning.   GLUCOSE BLOOD (ACCU-CHEK GUIDE) TEST STRIP    DX E11.8 check blood sugars three times daily as directed   INSULIN GLARGINE (LANTUS) 100 UNIT/ML INJECTION    Inject 0.44 mLs (44 Units total) into the skin at bedtime.   INSULIN SYRINGE-NEEDLE U-100 (INSULIN SYRINGE 1CC/31GX5/16") 31G X 5/16" 1 ML MISC    1 Syringe by Does not apply route at bedtime. Dx: E11.40   IRON PO    Take 1 tablet by mouth every evening.    LABETALOL (NORMODYNE) 100 MG TABLET    TAKE 1 TABLET(100  MG) BY MOUTH TWICE DAILY   METFORMIN (GLUCOPHAGE) 1000 MG TABLET    TAKE 1 TABLET(1000 MG) BY MOUTH DAILY WITH BREAKFAST   OLMESARTAN (BENICAR) 40 MG TABLET    TAKE 1 TABLET(40 MG) BY MOUTH DAILY   TORSEMIDE (DEMADEX) 20 MG TABLET    TAKE 1/2 TABLET(10 MG) BY MOUTH EVERY MORNING  Modified Medications   No medications on file  Discontinued Medications   No medications on file    Physical Exam:  Vitals:   11/19/19 1048  BP: 132/78  Pulse: 63  Temp: (!) 96.6 F (35.9 C)  TempSrc: Temporal  SpO2: 98%  Weight: 264 lb (119.7 kg)  Height: _0  (1.676 m)   Body mass index is 42.61 kg/m. Wt Readings from Last 3 Encounters:  11/19/19 264 lb (119.7 kg)  07/16/19 271 lb (122.9 kg)  08/28/18 260 lb (117.9 kg)    Physical Exam Constitutional:      General: She is not in acute distress.    Appearance: She is well-developed. She is not diaphoretic.  HENT:     Head: Normocephalic and atraumatic.  Eyes:     Conjunctiva/sclera: Conjunctivae normal.  Cardiovascular:     Rate and Rhythm: Normal rate and regular rhythm.     Heart sounds: Normal heart sounds.  Pulmonary:     Effort: Pulmonary effort is normal.     Breath sounds: Normal breath sounds.  Abdominal:     General: Bowel sounds are normal.     Palpations: Abdomen is soft.  Musculoskeletal:        General: No tenderness.     Cervical back: Normal range of motion and neck supple.  Skin:    General: Skin is warm and dry.  Neurological:     Mental Status: She is alert.  Psychiatric:        Mood and Affect: Mood normal.     Labs reviewed: Basic Metabolic Panel: Recent Labs    01/15/19 1024 04/20/19 0936 07/14/19 1514  NA 141 143 145  K 4.4 4.8 4.6  CL 107 109 110  CO2 _1 GLUCOSE 116* 92 82  BUN 28* 25 24  CREATININE 1.52* 1.40* 1.49*  CALCIUM 10.0 10.0 9.9  TSH 3.69  --   --    Liver Function Tests: Recent Labs    01/15/19 1024 04/20/19 0936 07/14/19 1514  AST _2 ALT _3 BILITOT  0.4 0.3 0.3  PROT 6.0* 5.9* 5.8*   No results for input(s): LIPASE, AMYLASE in the last 8760 hours. No results for input(s): AMMONIA in the last 8760 hours. CBC: Recent Labs    01/15/19 1024 07/14/19 1514  WBC 4.3 4.3  NEUTROABS 1,699 2,025  HGB 11.1* 11.2*  HCT 33.8* 34.9*  MCV 90.1 91.8  PLT 244 221   Lipid Panel: Recent Labs    01/15/19 1024  CHOL 125  HDL 65  LDLCALC 46  TRIG 56  CHOLHDL 1.9   TSH: Recent Labs    01/15/19 1024  TSH 3.69   A1C: Lab Results  Component Value Date   HGBA1C 9.0 (H) 07/14/2019     Assessment/Plan 1. Chest pain, unspecified type -reports feelings across her chest that lasted for less than a second, last episode ~3 weeks ago. No associations with shortness of breath or activity. Has not had recent episode. Son at visit today states he has never heard this complaint before. - EKG 12-Lead which showed NSR, consistent with prior. Educated to seek immediate medical attention if chest pain occurs.   2. Neuropathy -stable at this. Educated on importance of proper blood sugar control.   3. Hallucination Reports having some worsening of hallucinations, not on medication because this made hallucinations worse. Encouraged psych evaluation which son reports they have made appt.   4. Type 2 diabetes mellitus with diabetic neuropathy, with long-term current use of insulin (HCC) -dietary compliance is an issue, she appears to behaving some high and low  blood sugars. Did not bring bs log to office today. -Encouraged dietary compliance, routine foot care/monitoring and to keep up with diabetic eye exams through ophthalmology  -- discussed with the patient the pathophysiology of diabetes and the natural progression of the disease.  -stressed the importance of lifestyle changes including diet and exercise. -discussed complications associated with diabetes including retinopathy, nephropathy, neuropathy as well as increased risk of cardiovascular  disease. We went over the benefit seen with glycemic control.  She already has CKD, retinopathy and neuropathy due to long standing uncontrolled blood sugars.  - Hemoglobin A1c - COMPLETE METABOLIC PANEL WITH GFR  5. Stage 3 chronic kidney disease, unspecified whether stage 3a or 3b CKD -Encourage proper hydration and to avoid NSAIDS (Aleve, Advil, Motrin, Ibuprofen)  - COMPLETE METABOLIC PANEL WITH GFR  6. Hyperlipidemia, unspecified hyperlipidemia type Continues on Lipitor, encouraged dietary modifications. - COMPLETE METABOLIC PANEL WITH GFR  7. Anemia due to stage 3b chronic kidney disease -will follow up today - CBC with Differential/Platelet  8. Peripheral vascular disease, unspecified (HCC) stable, continues on ASA  9. Shortness of breath -pt able to complete full sentences and sats 98% in office. No cough, congestion or fever noted. Will get cbc to monitor hgb and wbc. To monitor and notify if symptoms worsen  10. Neck pain.  Good ROM noted, without worsening of hand strength or paresthesias. Encouraged heating pad and muscle rub with proper pillow for support. Can use tylenol as needed for pain, can also consult PT if pain does not improve or worsens.   Next appt: 4 weeks for MOST form and BS review  Tyjay Galindo K. [place], [facility] [phone]

## 2019-11-19 NOTE — Patient Instructions (Signed)
To use tylenol 325 mg 1-2 tablets every 6 hours as needed for neck pain To use supportive pillow to sleep on a night  To use heating pad three times daily to neck then can apply muscle rubs after

## 2019-11-20 LAB — COMPLETE METABOLIC PANEL WITH GFR
AG Ratio: 1.3 (calc) (ref 1.0–2.5)
ALT: 10 U/L (ref 6–29)
AST: 12 U/L (ref 10–35)
Albumin: 3.4 g/dL — ABNORMAL LOW (ref 3.6–5.1)
Alkaline phosphatase (APISO): 56 U/L (ref 37–153)
BUN/Creatinine Ratio: 21 (calc) (ref 6–22)
BUN: 32 mg/dL — ABNORMAL HIGH (ref 7–25)
CO2: 26 mmol/L (ref 20–32)
Calcium: 9.6 mg/dL (ref 8.6–10.4)
Chloride: 108 mmol/L (ref 98–110)
Creat: 1.52 mg/dL — ABNORMAL HIGH (ref 0.60–0.93)
GFR, Est African American: 38 mL/min/{1.73_m2} — ABNORMAL LOW (ref >=60)
GFR, Est Non African American: 33 mL/min/{1.73_m2} — ABNORMAL LOW (ref >=60)
Globulin: 2.6 g/dL (calc) (ref 1.9–3.7)
Glucose, Bld: 120 mg/dL — ABNORMAL HIGH (ref 65–99)
Potassium: 4.7 mmol/L (ref 3.5–5.3)
Sodium: 140 mmol/L (ref 135–146)
Total Bilirubin: 0.3 mg/dL (ref 0.2–1.2)
Total Protein: 6 g/dL — ABNORMAL LOW (ref 6.1–8.1)

## 2019-11-20 LAB — CBC WITH DIFFERENTIAL/PLATELET
Absolute Monocytes: 190 cells/uL — ABNORMAL LOW (ref 200–950)
Basophils Absolute: 42 cells/uL (ref 0–200)
Basophils Relative: 1.1 %
Eosinophils Absolute: 160 cells/uL (ref 15–500)
Eosinophils Relative: 4.2 %
HCT: 36 % (ref 35.0–45.0)
Hemoglobin: 11.5 g/dL — ABNORMAL LOW (ref 11.7–15.5)
Lymphs Abs: 1843 cells/uL (ref 850–3900)
MCH: 29.3 pg (ref 27.0–33.0)
MCHC: 31.9 g/dL — ABNORMAL LOW (ref 32.0–36.0)
MCV: 91.8 fL (ref 80.0–100.0)
MPV: 11.7 fL (ref 7.5–12.5)
Monocytes Relative: 5 %
Neutro Abs: 1566 cells/uL (ref 1500–7800)
Neutrophils Relative %: 41.2 %
Platelets: 212 10*3/uL (ref 140–400)
RBC: 3.92 10*6/uL (ref 3.80–5.10)
RDW: 13.3 % (ref 11.0–15.0)
Total Lymphocyte: 48.5 %
WBC: 3.8 10*3/uL (ref 3.8–10.8)

## 2019-11-20 LAB — HEMOGLOBIN A1C
Hgb A1c MFr Bld: 8.5 % of total Hgb — ABNORMAL HIGH (ref <5.7)
Mean Plasma Glucose: 197 (calc)
eAG (mmol/L): 10.9 (calc)

## 2019-12-15 ENCOUNTER — Other Ambulatory Visit: Payer: Self-pay

## 2019-12-15 ENCOUNTER — Ambulatory Visit (INDEPENDENT_AMBULATORY_CARE_PROVIDER_SITE_OTHER): Payer: Medicare HMO | Admitting: Nurse Practitioner

## 2019-12-15 ENCOUNTER — Encounter: Payer: Self-pay | Admitting: Nurse Practitioner

## 2019-12-15 DIAGNOSIS — E785 Hyperlipidemia, unspecified: Secondary | ICD-10-CM

## 2019-12-15 DIAGNOSIS — N1832 Chronic kidney disease, stage 3b: Secondary | ICD-10-CM | POA: Diagnosis not present

## 2019-12-15 DIAGNOSIS — I10 Essential (primary) hypertension: Secondary | ICD-10-CM

## 2019-12-15 DIAGNOSIS — E0821 Diabetes mellitus due to underlying condition with diabetic nephropathy: Secondary | ICD-10-CM

## 2019-12-15 DIAGNOSIS — Z7189 Other specified counseling: Secondary | ICD-10-CM

## 2019-12-15 DIAGNOSIS — E0822 Diabetes mellitus due to underlying condition with diabetic chronic kidney disease: Secondary | ICD-10-CM

## 2019-12-15 DIAGNOSIS — Z794 Long term (current) use of insulin: Secondary | ICD-10-CM

## 2019-12-15 MED ORDER — OLMESARTAN MEDOXOMIL 40 MG PO TABS: ORAL_TABLET | ORAL | 1 refills | Status: DC

## 2019-12-15 MED ORDER — LABETALOL HCL 100 MG PO TABS: ORAL_TABLET | ORAL | 1 refills | Status: DC

## 2019-12-15 MED ORDER — METFORMIN HCL 1000 MG PO TABS: ORAL_TABLET | ORAL | 1 refills | Status: DC

## 2019-12-15 MED ORDER — ATORVASTATIN CALCIUM 20 MG PO TABS: ORAL_TABLET | ORAL | 1 refills | Status: DC

## 2019-12-15 MED ORDER — FREESTYLE LIBRE 14 DAY SENSOR MISC: 1.0000 | Freq: Every day | 12 refills | Status: DC | PRN

## 2019-12-15 MED ORDER — FREESTYLE LIBRE 14 DAY READER DEVI: 1.0000 | Freq: Every day | 0 refills | Status: DC | PRN

## 2019-12-15 MED ORDER — NOVOLOG PENFILL 100 UNIT/ML ~~LOC~~ SOCT: 5.0000 [IU] | Freq: Three times a day (TID) | SUBCUTANEOUS | 11 refills | Status: DC

## 2019-12-15 MED ORDER — TORSEMIDE 20 MG PO TABS: ORAL_TABLET | ORAL | 1 refills | Status: DC

## 2019-12-15 MED ORDER — INSULIN PEN NEEDLE 31G X 5 MM MISC: 3 refills | Status: AC

## 2019-12-15 NOTE — Progress Notes (Signed)
This service is provided via telemedicine  No vital signs collected/recorded due to the encounter was a telemedicine visit.   Location of patient (ex: home, work):  Home  Patient consents to a telephone visit: Yes, see telephone encounter dated 12/14/2019  Location of the provider (ex: office, home):  [place]  Name of any referring provider:  N/A  Names of all persons participating in the telemedicine service and their role in the encounter:  Sherrie Mustache, Nurse Practitioner, Carroll Kinds, CMA, Tywana (daughter), and patient.   Time spent on call:  8 minutes with medical assistant      Careteam: Patient Care Team: Lauree Chandler, NP as PCP - General (Geriatric Medicine) Cameron Sprang, MD as Consulting Physician (Neurology)  PLACE OF SERVICE:  [facility]  Advanced Directive information    Allergies  Allergen Reactions  . Oxycodone Other (See Comments)    "I'm swimming; I'm floating; I'm diving; I'm in another world."  . Zyprexa [Olanzapine]     Increase in hallucination     Chief Complaint  Patient presents with  . Advanced Directive    Advanced care planning to discuss MOST form  . Medication Management    Request for new meter and supplies (freestyle [place])      HPI: Patient is a 74 y.o. female for follow up.  Reports they are only taking blood sugars at this time in the morning. Blood sugars are ranging from 90-120 in the morning but daughter reports blood sugars get high as the day goes on. Has not checked blood sugar in the evening recently but "know its high" A1c 8.5 on last labs Reports she did require novolog in the past.  No hypoglycemic episodes   Review of Systems:  Review of Systems  Constitutional: Negative for chills and fever.  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   Cardiovascular: Negative for chest pain and leg swelling.  Neurological: Positive for  tingling and weakness. Negative for dizziness and loss of consciousness.    Past Medical History:  Diagnosis Date  . Anemia of chronic disease   . Arthritis   . Back pain, chronic   . Blind   . Candida infection of mouth   . Diabetes mellitus   . Esophageal reflux   . GERD (gastroesophageal reflux disease)   . Hallucination   . Hand pain, left   . Hyperlipidemia   . Hypertension   . Hypertensive renal disease   . Insomnia   . Morbid obesity ([place])   . OAB (overactive bladder)   . Osteoarthritis    left shoulder  . Poor circulation   . PVD (peripheral vascular disease) ([place])   . Retinal detachment 11/2012  . 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 Senile purpura ([place])   . Vitamin D deficiency    Past Surgical History:  Procedure Laterality Date  . CATARACT EXTRACTION, BILATERAL    . INCISION AND DRAINAGE PERIRECTAL ABSCESS  11/20/2011   Procedure: IRRIGATION AND DEBRIDEMENT PERIRECTAL ABSCESS;  Surgeon: Zenovia Jarred, MD;  Location: [facility];  Service: General;  Laterality: Right;  Incision and Drainage of Right Groin Abscess   Social History:   reports that she quit smoking about 43 years ago. Her smoking use included cigarettes. She has a 3.75 pack-year smoking history. She has never used smokeless tobacco. She reports that she does not drink alcohol or  use drugs.  Family History  Problem Relation Age of Onset  . Heart disease Mother   . Diabetes Mother   . Hyperlipidemia Son   . Hypertension Son     Medications: Patient's Medications  New Prescriptions   No medications on file  Previous Medications   AMLODIPINE (NORVASC) 10 MG TABLET    TAKE 1 TABLET(10 MG) BY MOUTH DAILY   ASPIRIN EC 81 MG TABLET    Take 81 mg by mouth daily.   ATORVASTATIN (LIPITOR) 20 MG TABLET    TAKE 1 TABLET(20 MG) BY MOUTH DAILY AT 6 PM   CALCIUM PO    Take 1,000 mg by mouth 2 (two) times daily.    CILOSTAZOL (PLETAL) 100 MG TABLET    TAKE 1/2 TABLET BY MOUTH EVERY MORNING    DOCUSATE SODIUM (COLACE) 100 MG CAPSULE    Take 100 mg by mouth 2 (two) times daily. Stool softener   ESOMEPRAZOLE (NEXIUM) 20 MG CAPSULE    Take 20 mg by mouth every morning.   INSULIN GLARGINE (LANTUS) 100 UNIT/ML INJECTION    Inject 0.44 mLs (44 Units total) into the skin at bedtime.   INSULIN SYRINGE-NEEDLE U-100 (INSULIN SYRINGE 1CC/31GX5/16") 31G X 5/16" 1 ML MISC    1 Syringe by Does not apply route at bedtime. Dx: E11.40   IRON PO    Take 1 tablet by mouth every evening.    LABETALOL (NORMODYNE) 100 MG TABLET    TAKE 1 TABLET(100 MG) BY MOUTH TWICE DAILY   METFORMIN (GLUCOPHAGE) 1000 MG TABLET    TAKE 1 TABLET(1000 MG) BY MOUTH DAILY WITH BREAKFAST   OLMESARTAN (BENICAR) 40 MG TABLET    TAKE 1 TABLET(40 MG) BY MOUTH DAILY   TORSEMIDE (DEMADEX) 20 MG TABLET    TAKE 1/2 TABLET(10 MG) BY MOUTH EVERY MORNING  Modified Medications   No medications on file  Discontinued Medications   ACCU-CHEK FASTCLIX LANCETS MISC    by Does not apply route. Check blood sugar 2 times daily   BLOOD GLUCOSE MONITORING SUPPL (ACCU-CHEK AVIVA PLUS) W/DEVICE KIT    Use to test Blood sugar three times daily. Dx: E11.40   GLUCOSE BLOOD (ACCU-CHEK GUIDE) TEST STRIP    DX E11.8 check blood sugars three times daily as directed    Physical Exam:  There were no vitals filed for this visit. There is no height or weight on file to calculate BMI. Wt Readings from Last 3 Encounters:  11/19/19 264 lb (119.7 kg)  07/16/19 271 lb (122.9 kg)  08/28/18 260 lb (117.9 kg)      Labs reviewed: Basic Metabolic Panel: Recent Labs    01/15/19 1024 01/15/19 1024 04/20/19 0936 07/14/19 1514 11/19/19 1151  NA 141   < > 143 145 140  K 4.4   < > 4.8 4.6 4.7  CL 107   < > 109 110 108  CO2 27   < > _0 GLUCOSE 116*   < > 92 82 120*  BUN 28*   < > 25 24 32*  CREATININE 1.52*   < > 1.40* 1.49* 1.52*  CALCIUM 10.0   < > 10.0 9.9 9.6  TSH 3.69  --   --   --   --    < > = values in this interval not displayed.    Liver Function Tests: Recent Labs    04/20/19 0936 07/14/19 1514 11/19/19 1151  AST _1 ALT _2 BILITOT  0.3 0.3 0.3  PROT 5.9* 5.8* 6.0*   No results for input(s): LIPASE, AMYLASE in the last 8760 hours. No results for input(s): AMMONIA in the last 8760 hours. CBC: Recent Labs    01/15/19 1024 07/14/19 1514 11/19/19 1151  WBC 4.3 4.3 3.8  NEUTROABS 1,699 2,025 1,566  HGB 11.1* 11.2* 11.5*  HCT 33.8* 34.9* 36.0  MCV 90.1 91.8 91.8  PLT 244 221 212   Lipid Panel: Recent Labs    01/15/19 1024  CHOL 125  HDL 65  LDLCALC 46  TRIG 56  CHOLHDL 1.9   TSH: Recent Labs    01/15/19 1024  TSH 3.69   A1C: Lab Results  Component Value Date   HGBA1C 8.5 (H) 11/19/2019     Assessment/Plan 1. Diabetes mellitus due to underlying condition with stage 3 chronic kidney disease, with long-term current use of insulin (HCC) -not controlled on current regimen, needing meal coverage at this time. Will add novolog 5 units, to check blood sugars achs at this time.  -to hold novolog if she does not eat a meal. -educated on monitoring for hypoglycemia, and they are aware of this as well as how to treat - metFORMIN (GLUCOPHAGE) 1000 MG tablet; TAKE 1 TABLET(1000 MG) BY MOUTH DAILY WITH BREAKFAST  Dispense: 90 tablet; Refill: 1 - Continuous Blood Gluc Sensor (FREESTYLE LIBRE 14 DAY SENSOR) MISC; 1 each by Does not apply route daily as needed. E11.8  Dispense: 2 each; Refill: 12 - Continuous Blood Gluc Receiver (FREESTYLE LIBRE 14 DAY READER) DEVI; 1 each by Does not apply route daily as needed. E11.8  Dispense: 1 each; Refill: 0 - insulin aspart (NOVOLOG PENFILL) cartridge; Inject 5 Units into the skin 3 (three) times daily with meals.  Dispense: 15 mL; Refill: 11 - Insulin Pen Needle 31G X 5 MM MISC; Check blood sugar before meals and bedtime DX) E11.8  Dispense: 100 each; Refill: 3  2. Hyperlipidemia, unspecified hyperlipidemia type Refill provided  - atorvastatin  (LIPITOR) 20 MG tablet; TAKE 1 TABLET(20 MG) BY MOUTH DAILY AT 6 PM  Dispense: 90 tablet; Refill: 1  3. Essential hypertension, benign -refill provided, continue current medication with lifestyle modification.  - olmesartan (BENICAR) 40 MG tablet; TAKE 1 TABLET(40 MG) BY MOUTH DAILY  Dispense: 90 tablet; Refill: 1 - torsemide (DEMADEX) 20 MG tablet; TAKE 1/2 TABLET(10 MG) BY MOUTH EVERY MORNING  Dispense: 45 tablet; Refill: 1 - labetalol (NORMODYNE) 100 MG tablet; TAKE 1 TABLET(100 MG) BY MOUTH TWICE DAILY  Dispense: 180 tablet; Refill: 1  4. Advance care planning -most form completed with pt and daughter in law who is her heath care agent.  - DNR (Do Not Resuscitate)  Next appt: 4 weeks for blood sugar review [name]  [facility] [phone]   Virtual Visit via Video Note  I connected with [name] on 12/15/19 at  3:45 PM EDT by a video enabled telemedicine application and verified that I am speaking with the correct person using two identifiers.  Location: Patient: home Provider: office    I discussed the limitations of evaluation and management by telemedicine and the availability of in person appointments. The patient expressed understanding and agreed to proceed.    I discussed the assessment and treatment plan with the patient. The patient was provided an opportunity to ask questions and all were answered. The patient agreed with the plan and demonstrated an understanding of the instructions.   The patient was advised  to call back or seek an in-person evaluation if the symptoms worsen or if the condition fails to improve as anticipated.  I provided 20 minutes of non-face-to-face time during this encounter.  [name], AGNP Avs printed and mailed.

## 2019-12-24 ENCOUNTER — Telehealth: Payer: Self-pay | Admitting: *Deleted

## 2019-12-24 ENCOUNTER — Telehealth: Payer: Self-pay

## 2019-12-24 ENCOUNTER — Other Ambulatory Visit: Payer: Self-pay | Admitting: Nurse Practitioner

## 2019-12-24 DIAGNOSIS — Z794 Long term (current) use of insulin: Secondary | ICD-10-CM

## 2019-12-24 MED ORDER — INSULIN ASPART 100 UNIT/ML FLEXPEN: 5.0000 [IU] | PEN_INJECTOR | Freq: Three times a day (TID) | SUBCUTANEOUS | 11 refills | Status: DC

## 2019-12-24 NOTE — Telephone Encounter (Addendum)
The pharmacy called and stated the patient was prescribed an insulin for a pump, which the patient does not have.  The patient needs NovoLog Flexpen.   insulin aspart (NOVOLOG PENFILL) cartridge 5 Units, 3 times daily with meals 11 ordered        I do not see Novolog on previous med lists. Is this the correct dosing for the Flexpen?

## 2019-12-24 NOTE — Telephone Encounter (Signed)
Daughter, Magda Paganini called and stated that they want to speak with Shanda Bumps concerning changing the DNR paperwork and requesting a referral to a Psychiatrist.   Gave the numbers the the 3 Psychiatrics we have and she stated that she will call.   Stated that patient wants to change the DNR/MOST paperwork because she has regret how she answered the questions and wants it redone. Stated that patient was emotional at the time.   Please Advise.

## 2019-12-24 NOTE — Telephone Encounter (Signed)
Yes should be flexpen NOT pump

## 2019-12-24 NOTE — Telephone Encounter (Signed)
She will need an appt to change the MOST form, it requires signatures for it to be completed.

## 2019-12-27 NOTE — Telephone Encounter (Signed)
Appointment scheduled for 12/29/19 with Shanda Bumps in office.

## 2019-12-29 ENCOUNTER — Other Ambulatory Visit: Payer: Self-pay

## 2019-12-29 ENCOUNTER — Ambulatory Visit (INDEPENDENT_AMBULATORY_CARE_PROVIDER_SITE_OTHER): Payer: Medicare HMO | Admitting: Nurse Practitioner

## 2019-12-29 VITALS — BP 122/60 | HR 68 | Temp 96.6°F | Ht 66.0 in | Wt 280.0 lb

## 2019-12-29 DIAGNOSIS — E0821 Diabetes mellitus due to underlying condition with diabetic nephropathy: Secondary | ICD-10-CM

## 2019-12-29 DIAGNOSIS — N1832 Chronic kidney disease, stage 3b: Secondary | ICD-10-CM | POA: Diagnosis not present

## 2019-12-29 DIAGNOSIS — Z794 Long term (current) use of insulin: Secondary | ICD-10-CM

## 2019-12-29 DIAGNOSIS — Z6841 Body Mass Index (BMI) 40.0 and over, adult: Secondary | ICD-10-CM | POA: Diagnosis not present

## 2019-12-29 DIAGNOSIS — Z7189 Other specified counseling: Secondary | ICD-10-CM

## 2019-12-29 DIAGNOSIS — E1122 Type 2 diabetes mellitus with diabetic chronic kidney disease: Secondary | ICD-10-CM | POA: Diagnosis not present

## 2019-12-29 MED ORDER — INSULIN ASPART 100 UNIT/ML FLEXPEN: PEN_INJECTOR | SUBCUTANEOUS | 11 refills | Status: DC

## 2019-12-29 NOTE — Progress Notes (Signed)
Careteam: Patient Care Team: Sharon Seller, NP as PCP - General (Geriatric Medicine) Van Clines, MD as Consulting Physician (Neurology)  PLACE OF SERVICE:  [facility]  Advanced Directive information    Allergies  Allergen Reactions  . Oxycodone Other (See Comments)    "I'm swimming; I'm floating; I'm diving; I'm in another world."  . Zyprexa [Olanzapine]     Increase in hallucination     Chief Complaint  Patient presents with  . Advanced Directive    Review MOST form   . Medication Management    Unable to update medications patient not aware or meds      HPI: Patient is a 74 y.o. female for MOST form. Pt and daughter in law wanted to review what DNR meant. Pt was adamant that she did not want to be on the ventilator. She is agreeable to treatment such as IVs, Oxygen via nasal canal and facemask if needed but would not want to be placed on a ventilator for any reason. Her most form reflects these request.   She got the freestyle to monitor her blood sugar.  She went to bed and when she woke up it was on her back and needle was broken. Checking blood sugars manually.  Blood sugars in the morning 70s-80s but by bedtime blood sugars 218-383.  Getting 44 units of lantus at bedtime and novolog 5 units with each meal  One time low of 45 noted freestyle but since has been in the 70s on her other meter. Lows were in the morning. She does get a snack before bed.  Daughter in law concerned over ongoing high blood sugars at dinner  Review of Systems:  Review of Systems  Constitutional: Negative for chills, fever and weight loss.  HENT: Negative for tinnitus.   Respiratory: Negative for cough, sputum production and shortness of breath.   Cardiovascular: Negative for chest pain, palpitations and leg swelling.  Gastrointestinal: Negative for abdominal pain, constipation, diarrhea and heartburn.  Musculoskeletal: Negative for back pain, falls, joint pain and myalgias.   Skin: Negative.   Neurological: Negative for dizziness and headaches.  Psychiatric/Behavioral: Negative for depression and memory loss. The patient does not have insomnia.     Past Medical History:  Diagnosis Date  . Anemia of chronic disease   . Arthritis   . Back pain, chronic   . Blind   . Candida infection of mouth   . Diabetes mellitus   . Esophageal reflux   . GERD (gastroesophageal reflux disease)   . Hallucination   . Hand pain, left   . Hyperlipidemia   . Hypertension   . Hypertensive renal disease   . Insomnia   . Morbid obesity (HCC)   . OAB (overactive bladder)   . Osteoarthritis    left shoulder  . Poor circulation   . PVD (peripheral vascular disease) (HCC)   . Retinal detachment 11/2012  . Senile dementia with delusional features (HCC)   . Senile purpura (HCC)   . Vitamin D deficiency    Past Surgical History:  Procedure Laterality Date  . CATARACT EXTRACTION, BILATERAL    . INCISION AND DRAINAGE PERIRECTAL ABSCESS  11/20/2011   Procedure: IRRIGATION AND DEBRIDEMENT PERIRECTAL ABSCESS;  Surgeon: Liz Malady, MD;  Location: [facility] OR;  Service: General;  Laterality: Right;  Incision and Drainage of Right Groin Abscess   Social History:   reports that she quit smoking about 43 years ago. Her smoking use included cigarettes. She  has a 3.75 pack-year smoking history. She has never used smokeless tobacco. She reports that she does not drink alcohol or use drugs.  Family History  Problem Relation Age of Onset  . Heart disease Mother   . Diabetes Mother   . Hyperlipidemia Son   . Hypertension Son     Medications: Patient's Medications  New Prescriptions   No medications on file  Previous Medications   AMLODIPINE (NORVASC) 10 MG TABLET    TAKE 1 TABLET(10 MG) BY MOUTH DAILY   ASPIRIN EC 81 MG TABLET    Take 81 mg by mouth daily.   ATORVASTATIN (LIPITOR) 20 MG TABLET    TAKE 1 TABLET(20 MG) BY MOUTH DAILY AT 6 PM   CALCIUM PO    Take 1,000 mg by mouth 2  (two) times daily.    CILOSTAZOL (PLETAL) 100 MG TABLET    TAKE 1/2 TABLET BY MOUTH EVERY MORNING   CONTINUOUS BLOOD GLUC RECEIVER (FREESTYLE LIBRE 14 DAY READER) DEVI    1 each by Does not apply route daily as needed. E11.8   CONTINUOUS BLOOD GLUC SENSOR (FREESTYLE LIBRE 14 DAY SENSOR) MISC    1 each by Does not apply route daily as needed. E11.8   DOCUSATE SODIUM (COLACE) 100 MG CAPSULE    Take 100 mg by mouth 2 (two) times daily. Stool softener   ESOMEPRAZOLE (NEXIUM) 20 MG CAPSULE    Take 20 mg by mouth every morning.   INSULIN ASPART (NOVOLOG) 100 UNIT/ML FLEXPEN    Inject 5 Units into the skin 3 (three) times daily with meals.   INSULIN GLARGINE (LANTUS) 100 UNIT/ML INJECTION    Inject 0.44 mLs (44 Units total) into the skin at bedtime.   INSULIN PEN NEEDLE 31G X 5 MM MISC    Check blood sugar before meals and bedtime DX) E11.8   INSULIN SYRINGE-NEEDLE U-100 (INSULIN SYRINGE 1CC/31GX5/16") 31G X 5/16" 1 ML MISC    1 Syringe by Does not apply route at bedtime. Dx: E11.40   IRON PO    Take 1 tablet by mouth every evening.    LABETALOL (NORMODYNE) 100 MG TABLET    TAKE 1 TABLET(100 MG) BY MOUTH TWICE DAILY   METFORMIN (GLUCOPHAGE) 1000 MG TABLET    TAKE 1 TABLET(1000 MG) BY MOUTH DAILY WITH BREAKFAST   OLMESARTAN (BENICAR) 40 MG TABLET    TAKE 1 TABLET(40 MG) BY MOUTH DAILY   TORSEMIDE (DEMADEX) 20 MG TABLET    TAKE 1/2 TABLET(10 MG) BY MOUTH EVERY MORNING  Modified Medications   No medications on file  Discontinued Medications   No medications on file    Physical Exam:  Vitals:   12/29/19 1513  BP: 122/60  Pulse: 68  Temp: (!) 96.6 F (35.9 C)  TempSrc: Temporal  SpO2: 96%  Weight: 280 lb (127 kg)  Height: 5\' 6"  (1.676 m)   Body mass index is 45.19 kg/m. Wt Readings from Last 3 Encounters:  12/29/19 280 lb (127 kg)  11/19/19 264 lb (119.7 kg)  07/16/19 271 lb (122.9 kg)    Physical Exam Constitutional:      General: She is not in acute distress.    Appearance: She is  well-developed. She is not diaphoretic.  HENT:     Head: Normocephalic and atraumatic.     Mouth/Throat:     Pharynx: No oropharyngeal exudate.  Eyes:     Conjunctiva/sclera: Conjunctivae normal.     Pupils: Pupils are equal, round, and reactive to light.  Musculoskeletal:     Cervical back: Normal range of motion and neck supple.  Skin:    General: Skin is warm and dry.  Neurological:     Mental Status: She is alert and oriented to person, place, and time. Mental status is at baseline.  Psychiatric:        Mood and Affect: Mood normal.        Behavior: Behavior normal.     Labs reviewed: Basic Metabolic Panel: Recent Labs    01/15/19 1024 01/15/19 1024 04/20/19 0936 07/14/19 1514 11/19/19 1151  NA 141   < > 143 145 140  K 4.4   < > 4.8 4.6 4.7  CL 107   < > 109 110 108  CO2 27   < > 29 28 26   GLUCOSE 116*   < > 92 82 120*  BUN 28*   < > 25 24 32*  CREATININE 1.52*   < > 1.40* 1.49* 1.52*  CALCIUM 10.0   < > 10.0 9.9 9.6  TSH 3.69  --   --   --   --    < > = values in this interval not displayed.   Liver Function Tests: Recent Labs    04/20/19 0936 07/14/19 1514 11/19/19 1151  AST 13 12 12   ALT 13 8 10   BILITOT 0.3 0.3 0.3  PROT 5.9* 5.8* 6.0*   No results for input(s): LIPASE, AMYLASE in the last 8760 hours. No results for input(s): AMMONIA in the last 8760 hours. CBC: Recent Labs    01/15/19 1024 07/14/19 1514 11/19/19 1151  WBC 4.3 4.3 3.8  NEUTROABS 1,699 2,025 1,566  HGB 11.1* 11.2* 11.5*  HCT 33.8* 34.9* 36.0  MCV 90.1 91.8 91.8  PLT 244 221 212   Lipid Panel: Recent Labs    01/15/19 1024  CHOL 125  HDL 65  LDLCALC 46  TRIG 56  CHOLHDL 1.9   TSH: Recent Labs    01/15/19 1024  TSH 3.69   A1C: Lab Results  Component Value Date   HGBA1C 8.5 (H) 11/19/2019     Assessment/Plan 1. 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 Will increase novolog to 10 units with dinner and to continue 5 units with breakfast and lunch- may have to increase these in continue to trend up. Plans to use freestyle libra for more continuous readings for better review at next follow up - insulin aspart (NOVOLOG) 100 UNIT/ML FlexPen; 5 units with breakfast and lunch and 10 units with dinner  Dispense: 15 mL; Refill: 11  2. Advance care planning -reviewed and pt would not like to make any changes at this time.  Next appt: 01/18/2020 as scheduled.  03/17/19. 01/19/2020  [facility] [phone]

## 2020-01-07 ENCOUNTER — Ambulatory Visit: Payer: Medicare HMO | Admitting: Podiatry

## 2020-01-18 ENCOUNTER — Telehealth: Payer: Medicare HMO | Admitting: Nurse Practitioner

## 2020-01-24 ENCOUNTER — Telehealth: Payer: Self-pay

## 2020-01-24 ENCOUNTER — Encounter: Payer: Self-pay | Admitting: Nurse Practitioner

## 2020-01-24 ENCOUNTER — Other Ambulatory Visit: Payer: Self-pay

## 2020-01-24 ENCOUNTER — Ambulatory Visit (INDEPENDENT_AMBULATORY_CARE_PROVIDER_SITE_OTHER): Payer: Medicare HMO | Admitting: Nurse Practitioner

## 2020-01-24 DIAGNOSIS — Z1231 Encounter for screening mammogram for malignant neoplasm of breast: Secondary | ICD-10-CM | POA: Diagnosis not present

## 2020-01-24 DIAGNOSIS — Z0000 Encounter for general adult medical examination without abnormal findings: Secondary | ICD-10-CM | POA: Diagnosis not present

## 2020-01-24 DIAGNOSIS — E2839 Other primary ovarian failure: Secondary | ICD-10-CM

## 2020-01-24 NOTE — Patient Instructions (Signed)
[name] , Thank you for taking time to come for your Medicare Wellness Visit. I appreciate your ongoing commitment to your health goals. Please review the following plan we discussed and let me know if I can assist you in the future.   Screening recommendations/referrals: Colonoscopy aged out Mammogram to get at [facility] Bone Density to get at [facility] Recommended yearly ophthalmology/optometry visit for glaucoma screening and checkup Recommended yearly dental visit for hygiene and checkup  Vaccinations: Influenza vaccine due 04/2020 Pneumococcal vaccine up to date Tdap vaccine RECOMMENDED TO GET AT LOCAL PHARMACY Shingles vaccine RECOMMENDED, to get at your local pharmacy  Advanced directives: MOST form on file  Conditions/risks identified: fall, cardiovascular disease, complications related to uncontrolled diabetes.   Next appointment: 1 year   Preventive Care 76 Years and Older, Female Preventive care refers to lifestyle choices and visits with your health care provider that can promote health and wellness. What does preventive care include?  A yearly physical exam. This is also called an annual well check.  Dental exams once or twice a year.  Routine eye exams. Ask your health care provider how often you should have your eyes checked.  Personal lifestyle choices, including:  Daily care of your teeth and gums.  Regular physical activity.  Eating a healthy diet.  Avoiding tobacco and drug use.  Limiting alcohol use.  Practicing safe sex.  Taking low-dose aspirin every day.  Taking vitamin and mineral supplements as recommended by your health care provider. What happens during an annual well check? The services and screenings done by your health care provider during your annual well check will depend on your age, overall health, lifestyle risk factors, and family history of disease. Counseling  Your health  care provider may ask you questions about your:  Alcohol use.  Tobacco use.  Drug use.  Emotional well-being.  Home and relationship well-being.  Sexual activity.  Eating habits.  History of falls.  Memory and ability to understand (cognition).  Work and work Astronomer.  Reproductive health. Screening  You may have the following tests or measurements:  Height, weight, and BMI.  Blood pressure.  Lipid and cholesterol levels. These may be checked every 5 years, or more frequently if you are over 76 years old.  Skin check.  Lung cancer screening. You may have this screening every year starting at age 76 if you have a 30-pack-year history of smoking and currently smoke or have quit within the past 15 years.  Fecal occult blood test (FOBT) of the stool. You may have this test every year starting at age 76.  Flexible sigmoidoscopy or colonoscopy. You may have a sigmoidoscopy every 5 years or a colonoscopy every 10 years starting at age 76.  Hepatitis C blood test.  Hepatitis B blood test.  Sexually transmitted disease (STD) testing.  Diabetes screening. This is done by checking your blood sugar (glucose) after you have not eaten for a while (fasting). You may have this done every 1-3 years.  Bone density scan. This is done to screen for osteoporosis. You may have this done starting at age 76.  Mammogram. This may be done every 1-2 years. Talk to your health care provider about how often you should have regular mammograms. Talk with your health care provider about your test results, treatment options, and if necessary, the need for more tests. Vaccines  Your health care provider may recommend certain vaccines, such as:  Influenza vaccine. This is recommended  every year.  Tetanus, diphtheria, and acellular pertussis (Tdap, Td) vaccine. You may need a Td booster every 10 years.  Zoster vaccine. You may need this after age 76.  Pneumococcal 13-valent conjugate  (PCV13) vaccine. One dose is recommended after age 76.  Pneumococcal polysaccharide (PPSV23) vaccine. One dose is recommended after age 70. Talk to your health care provider about which screenings and vaccines you need and how often you need them. This information is not intended to replace advice given to you by your health care provider. Make sure you discuss any questions you have with your health care provider. Document Released: 08/25/2015 Document Revised: 04/17/2016 Document Reviewed: 05/30/2015 Elsevier Interactive Patient Education  2017 [place] Prevention in the Home Falls can cause injuries. They can happen to people of all ages. There are many things you can do to make your home safe and to help prevent falls. What can I do on the outside of my home?  Regularly fix the edges of walkways and driveways and fix any cracks.  Remove anything that might make you trip as you walk through a door, such as a raised step or threshold.  Trim any bushes or trees on the path to your home.  Use bright outdoor lighting.  Clear any walking paths of anything that might make someone trip, such as rocks or tools.  Regularly check to see if handrails are loose or broken. Make sure that both sides of any steps have handrails.  Any raised decks and porches should have guardrails on the edges.  Have any leaves, snow, or ice cleared regularly.  Use sand or salt on walking paths during winter.  Clean up any spills in your garage right away. This includes oil or grease spills. What can I do in the bathroom?  Use night lights.  Install grab bars by the toilet and in the tub and shower. Do not use towel bars as grab bars.  Use non-skid mats or decals in the tub or shower.  If you need to sit down in the shower, use a plastic, non-slip stool.  Keep the floor dry. Clean up any water that spills on the floor as soon as it happens.  Remove soap buildup in the tub or shower  regularly.  Attach bath mats securely with double-sided non-slip rug tape.  Do not have throw rugs and other things on the floor that can make you trip. What can I do in the bedroom?  Use night lights.  Make sure that you have a light by your bed that is easy to reach.  Do not use any sheets or blankets that are too big for your bed. They should not hang down onto the floor.  Have a firm chair that has side arms. You can use this for support while you get dressed.  Do not have throw rugs and other things on the floor that can make you trip. What can I do in the kitchen?  Clean up any spills right away.  Avoid walking on wet floors.  Keep items that you use a lot in easy-to-reach places.  If you need to reach something above you, use a strong step stool that has a grab bar.  Keep electrical cords out of the way.  Do not use floor polish or wax that makes floors slippery. If you must use wax, use non-skid floor wax.  Do not have throw rugs and other things on the floor that can make you trip. What  can I do with my stairs?  Do not leave any items on the stairs.  Make sure that there are handrails on both sides of the stairs and use them. Fix handrails that are broken or loose. Make sure that handrails are as long as the stairways.  Check any carpeting to make sure that it is firmly attached to the stairs. Fix any carpet that is loose or worn.  Avoid having throw rugs at the top or bottom of the stairs. If you do have throw rugs, attach them to the floor with carpet tape.  Make sure that you have a light switch at the top of the stairs and the bottom of the stairs. If you do not have them, ask someone to add them for you. What else can I do to help prevent falls?  Wear shoes that:  Do not have high heels.  Have rubber bottoms.  Are comfortable and fit you well.  Are closed at the toe. Do not wear sandals.  If you use a stepladder:  Make sure that it is fully  opened. Do not climb a closed stepladder.  Make sure that both sides of the stepladder are locked into place.  Ask someone to hold it for you, if possible.  Clearly mark and make sure that you can see:  Any grab bars or handrails.  First and last steps.  Where the edge of each step is.  Use tools that help you move around (mobility aids) if they are needed. These include:  Canes.  Walkers.  Scooters.  Crutches.  Turn on the lights when you go into a dark area. Replace any light bulbs as soon as they burn out.  Set up your furniture so you have a clear path. Avoid moving your furniture around.  If any of your floors are uneven, fix them.  If there are any pets around you, be aware of where they are.  Review your medicines with your doctor. Some medicines can make you feel dizzy. This can increase your chance of falling. Ask your doctor what other things that you can do to help prevent falls. This information is not intended to replace advice given to you by your health care provider. Make sure you discuss any questions you have with your health care provider. Document Released: 05/25/2009 Document Revised: 01/04/2016 Document Reviewed: 09/02/2014 Elsevier Interactive Patient Education  2017 [employer].

## 2020-01-24 NOTE — Telephone Encounter (Signed)
[name], [name] are scheduled for a virtual visit with your provider today.    Just as we do with appointments in the office, we must obtain your consent to participate.  Your consent will be active for this visit and any virtual visit you may have with one of our providers in the next 365 days.    If you have a MyChart account, I can also send a copy of this consent to you electronically.  All virtual visits are billed to your insurance company just like a traditional visit in the office.  As this is a virtual visit, video technology does not allow for your provider to perform a traditional examination.  This may limit your provider's ability to fully assess your condition.  If your provider identifies any concerns that need to be evaluated in person or the need to arrange testing such as labs, EKG, etc, we will make arrangements to do so.    Although advances in technology are sophisticated, we cannot ensure that it will always work on either your end or our end.  If the connection with a video visit is poor, we may have to switch to a telephone visit.  With either a video or telephone visit, we are not always able to ensure that we have a secure connection.   I need to obtain your verbal consent now.   Are you willing to proceed with your visit today?   [name] has provided verbal consent on 01/24/2020 for a virtual visit (video or telephone).  Patients son, Gregary Signs was present and also gave consent.    Edison Simon [place], [place] 01/24/2020  2:59 PM

## 2020-01-24 NOTE — Progress Notes (Signed)
   This service is provided via telemedicine  No vital signs collected/recorded due to the encounter was a telemedicine visit.   Location of patient (ex: home, work): Home   Patient consents to a telephone visit: Yes, see telephone encounter dated 01/24/2020 with annual consent   Location of the provider (ex: office, home):  [facility], Office   Name of any referring provider: N/A  Names of all persons participating in the telemedicine service and their role in the encounter:  S.Chrae B/CMA, [name], NP, Gregary Signs (son), and Patient   Time spent on call:  12 min with medical assistant

## 2020-01-24 NOTE — Progress Notes (Signed)
Subjective:   [name] is a 74 y.o. female who presents for Medicare Annual (Subsequent) preventive examination.  Review of Systems:         Objective:     Vitals: There were no vitals taken for this visit.  There is no height or weight on file to calculate BMI.  Advanced Directives 01/24/2020 11/19/2019 01/21/2019 01/08/2019 08/28/2018 01/22/2018 01/16/2018  Does Patient Have a Medical Advance Directive? Yes Yes Yes Yes Yes No Yes  Type of Advance Directive Out of facility DNR (pink MOST or yellow form) Healthcare Power of [place];Living will Healthcare Power of Attorney Living will Healthcare Power of Attorney - Healthcare Power of Attorney  Does patient want to make changes to medical advance directive? No - Patient declined No - Patient declined - - - - Yes (MAU/Ambulatory/Procedural Areas - Information given)  Copy of Healthcare Power of Attorney in Chart? - No - copy requested No - copy requested - No - copy requested - No - copy requested  Pre-existing out of facility DNR order (yellow form or pink MOST form) - - - - - - -    Tobacco Social History   Tobacco Use  Smoking Status Former Smoker  . Packs/day: 0.25  . Years: 15.00  . Pack years: 3.75  . Types: Cigarettes  . Quit date: 08/12/1976  . Years since quitting: 43.4  Smokeless Tobacco Never Used     Counseling given: Not Answered   Clinical Intake:                       Past Medical History:  Diagnosis Date  . Anemia of chronic disease   . Arthritis   . Back pain, chronic   . Blind   . Candida infection of mouth   . Diabetes mellitus   . Esophageal reflux   . GERD (gastroesophageal reflux disease)   . Hallucination   . Hand pain, left   . Hyperlipidemia   . Hypertension   . Hypertensive renal disease   . Insomnia   . Morbid obesity (HCC)   . OAB (overactive bladder)   . Osteoarthritis    left shoulder  . Poor circulation   . PVD (peripheral vascular disease) (HCC)   . Retinal  detachment 11/2012  . Senile dementia with delusional features (HCC)   . Senile purpura (HCC)   . Vitamin D deficiency    Past Surgical History:  Procedure Laterality Date  . CATARACT EXTRACTION, BILATERAL    . INCISION AND DRAINAGE PERIRECTAL ABSCESS  11/20/2011   Procedure: IRRIGATION AND DEBRIDEMENT PERIRECTAL ABSCESS;  Surgeon: Liz Malady, MD;  Location: [facility] OR;  Service: General;  Laterality: Right;  Incision and Drainage of Right Groin Abscess   Family History  Problem Relation Age of Onset  . Heart disease Mother   . Diabetes Mother   . Hyperlipidemia Son   . Hypertension Son    Social History   Socioeconomic History  . Marital status: Divorced    Spouse name: Not on file  . Number of children: Not on file  . Years of education: Not on file  . Highest education level: Not on file  Occupational History  . Not on file  Tobacco Use  . Smoking status: Former Smoker    Packs/day: 0.25    Years: 15.00    Pack years: 3.75    Types: Cigarettes    Quit date: 08/12/1976    Years since quitting: 43.4  .  Smokeless tobacco: Never Used  Vaping Use  . Vaping Use: Never used  Substance and Sexual Activity  . Alcohol use: No  . Drug use: No  . Sexual activity: Not Currently  Other Topics Concern  . Not on file  Social History Narrative   Social History      Diet? none      Do you drink/eat things with caffeine? Yes, coffee and tea      Marital status?                 divorced                   What year were you married?      Do you live in a house, apartment, assisted living, condo, trailer, etc.? house      Is it one or more stories? yes      How many persons live in your home? 4- lives with daughter in law and son and granddaughter      Do you have any pets in your home? (please list) 1 dog      Highest level of education completed? 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                  no                     Type & how often? n/a      Advanced Directives      Do you have a living will? no      Do you have a DNR form?                                  If not, do you want to discuss one? no      Do you have signed POA/HPOA for forms? yes      Functional Status      Do you have difficulty bathing or dressing yourself? yes      Do you have difficulty preparing food or eating? yes      Do you have difficulty managing your medications? yes      Do you have difficulty managing your finances? yes      Do you have difficulty affording your medications? yes   Social Determinants of Health   Financial Resource Strain:   . Difficulty of Paying Living Expenses:   Food Insecurity:   . Worried About Charity fundraiser in the Last Year:   . Arboriculturist in the Last Year:   Transportation Needs:   . Film/video editor (Medical):   Marland Kitchen Lack of Transportation (Non-Medical):   Physical Activity:   . Days of Exercise per Week:   . Minutes of Exercise per Session:   Stress:   . Feeling of Stress :   Social Connections:   . Frequency of Communication with Friends and Family:   . Frequency of Social Gatherings with Friends and Family:   . Attends Religious Services:   . Active Member of Clubs or Organizations:   . Attends Archivist Meetings:   Marland Kitchen Marital Status:     Outpatient Encounter Medications as of 01/24/2020  Medication Sig  . amLODipine (NORVASC) 10 MG tablet TAKE 1 TABLET(10 MG)  BY MOUTH DAILY  . aspirin EC 81 MG tablet Take 81 mg by mouth daily.  Marland Kitchen atorvastatin (LIPITOR) 20 MG tablet TAKE 1 TABLET(20 MG) BY MOUTH DAILY AT 6 PM  . CALCIUM PO Take 1,000 mg by mouth 2 (two) times daily.   . cilostazol (PLETAL) 100 MG tablet TAKE 1/2 TABLET BY MOUTH EVERY MORNING  . Continuous Blood Gluc Receiver (FREESTYLE LIBRE 14 DAY READER) DEVI 1 each by Does not apply route daily as needed. E11.8  . Continuous Blood Gluc Sensor (FREESTYLE LIBRE 14  DAY SENSOR) MISC 1 each by Does not apply route daily as needed. E11.8  . docusate sodium (COLACE) 100 MG capsule Take 100 mg by mouth 2 (two) times daily. Stool softener  . esomeprazole (NEXIUM) 20 MG capsule Take 20 mg by mouth every morning.  . insulin aspart (NOVOLOG) 100 UNIT/ML FlexPen 5 units with breakfast and lunch and 10 units with dinner  . insulin glargine (LANTUS) 100 UNIT/ML injection Inject 0.44 mLs (44 Units total) into the skin at bedtime.  . Insulin Pen Needle 31G X 5 MM MISC Check blood sugar before meals and bedtime DX) E11.8  . Insulin Syringe-Needle U-100 (INSULIN SYRINGE 1CC/31GX5/16") 31G X 5/16" 1 ML MISC 1 Syringe by Does not apply route at bedtime. Dx: E11.40  . IRON PO Take 1 tablet by mouth every evening.   . labetalol (NORMODYNE) 100 MG tablet TAKE 1 TABLET(100 MG) BY MOUTH TWICE DAILY  . metFORMIN (GLUCOPHAGE) 1000 MG tablet TAKE 1 TABLET(1000 MG) BY MOUTH DAILY WITH BREAKFAST  . olmesartan (BENICAR) 40 MG tablet TAKE 1 TABLET(40 MG) BY MOUTH DAILY  . torsemide (DEMADEX) 20 MG tablet TAKE 1/2 TABLET(10 MG) BY MOUTH EVERY MORNING   No facility-administered encounter medications on file as of 01/24/2020.    Activities of Daily Living No flowsheet data found.  Patient Care Team: Sharon Seller, NP as PCP - General (Geriatric Medicine) Van Clines, MD as Consulting Physician (Neurology)    Assessment:   This is a routine wellness examination for Fal[place].  Exercise Activities and Dietary recommendations    Goals    .  better management of diabetes. (pt-stated)      A1c <8 with diet modifications.        Fall Risk Fall Risk  01/24/2020 11/19/2019 07/16/2019 01/21/2019 01/08/2019  Falls in the past year? 0 0 0 0 1  Comment - - - - -  Number falls in past yr: 0 0 0 0 1  Injury with Fall? 0 0 0 0 0  Risk for fall due to : - - - - History of fall(s)  Follow up - - - - -   Is the patient's home free of loose throw rugs in walkways, pet beds, electrical  cords, etc?   yes      Grab bars in the bathroom? yes      Handrails on the stairs?   yes      Adequate lighting?   yes  Timed Get Up and Go performed: na  Depression Screen PHQ 2/9 Scores 01/24/2020 01/21/2019 01/08/2019 04/17/2018  PHQ - 2 Score 0 1 0 -  PHQ- 9 Score - - - -  Exception Documentation - - - Other- indicate reason in comment box  Not completed - - - call completed with caregiver     Cognitive Function MMSE - Mini Mental State Exam 12/01/2017  Orientation to time 4  Orientation to Place 3  Registration 3  Attention/  Calculation 5  Recall 3  Language- name 2 objects 2  Language- repeat 1  Language- follow 3 step command 3  Language- read & follow direction 0  Language-read & follow direction-comments Pt is blind  Write a sentence 0  Write a sentence-comments Pt is blind  Copy design 0  Copy design-comments Pt is blind  Total score 24     6CIT Screen 01/24/2020 01/21/2019  What Year? 0 points 0 points  What month? 0 points 0 points  What time? 0 points 0 points  Count back from 20 0 points 0 points  Months in reverse 0 points 0 points  Repeat phrase 2 points 2 points  Total Score 2 2    Immunization History  Administered Date(s) Administered  . Fluad Quad(high Dose 65+) 07/16/2019  . Influenza, High Dose Seasonal PF 05/28/2018  . Influenza-Unspecified 05/12/2014, 04/08/2017  . PFIZER SARS-COV-2 Vaccination 11/23/2019, 12/15/2019  . Pneumococcal Conjugate-13 12/19/2014  . Pneumococcal Polysaccharide-23 09/12/2001, 10/08/2012    Qualifies for Shingles Vaccine?yes  Screening Tests Health Maintenance  Topic Date Due  . TETANUS/TDAP  Never done  . DEXA SCAN  Never done  . INFLUENZA VACCINE  03/12/2020  . HEMOGLOBIN A1C  05/20/2020  . FOOT EXAM  10/07/2020  . COVID-19 Vaccine  Completed  . PNA vac Low Risk Adult  Completed  . OPHTHALMOLOGY EXAM  Discontinued  . Hepatitis C Screening  Discontinued    Cancer Screenings: Lung: Low Dose CT Chest  recommended if Age 49-80 years, 30 pack-year currently smoking OR have quit w/in 15years. Patient does not qualify. Breast:  Up to date on Mammogram? No   Up to date of Bone Density/Dexa? No Colorectal: aged out  Additional Screenings:  Hepatitis C Screening: na     Plan:      I have personally reviewed and noted the following in the patient's chart:   . Medical and social history . Use of alcohol, tobacco or illicit drugs  . Current medications and supplements . Functional ability and status . Nutritional status . Physical activity . Advanced directives . List of other physicians . Hospitalizations, surgeries, and ER visits in previous 12 months . Vitals . Screenings to include cognitive, depression, and falls . Referrals and appointments  In addition, I have reviewed and discussed with patient certain preventive protocols, quality metrics, and best practice recommendations. A written personalized care plan for preventive services as well as general preventive health recommendations were provided to patient.     Sharon Seller, NP  01/24/2020

## 2020-02-02 ENCOUNTER — Other Ambulatory Visit: Payer: Self-pay

## 2020-02-02 ENCOUNTER — Telehealth: Payer: Self-pay

## 2020-02-02 ENCOUNTER — Encounter: Payer: Medicare HMO | Admitting: Nurse Practitioner

## 2020-02-02 NOTE — Telephone Encounter (Signed)
Patient was called 3 times for Video Visit, I left a message on the answering machine no return call as of yet.

## 2020-02-02 NOTE — Telephone Encounter (Signed)
Noted, will have emily un-arrive and reschedule when she calls back or attempt to reach her on another day. Thank you.

## 2020-02-03 NOTE — Progress Notes (Signed)
This encounter was created in error - please disregard.

## 2020-03-13 ENCOUNTER — Other Ambulatory Visit: Payer: Self-pay | Admitting: Nurse Practitioner

## 2020-03-13 DIAGNOSIS — E1140 Type 2 diabetes mellitus with diabetic neuropathy, unspecified: Secondary | ICD-10-CM

## 2020-05-19 ENCOUNTER — Ambulatory Visit (INDEPENDENT_AMBULATORY_CARE_PROVIDER_SITE_OTHER): Payer: Medicare HMO | Admitting: Nurse Practitioner

## 2020-05-19 ENCOUNTER — Encounter: Payer: Self-pay | Admitting: Nurse Practitioner

## 2020-05-19 ENCOUNTER — Other Ambulatory Visit: Payer: Self-pay

## 2020-05-19 VITALS — BP 128/76 | HR 65 | Temp 96.8°F | Ht 66.0 in | Wt 271.0 lb

## 2020-05-19 DIAGNOSIS — Z794 Long term (current) use of insulin: Secondary | ICD-10-CM | POA: Diagnosis not present

## 2020-05-19 DIAGNOSIS — I739 Peripheral vascular disease, unspecified: Secondary | ICD-10-CM

## 2020-05-19 DIAGNOSIS — G629 Polyneuropathy, unspecified: Secondary | ICD-10-CM | POA: Diagnosis not present

## 2020-05-19 DIAGNOSIS — R443 Hallucinations, unspecified: Secondary | ICD-10-CM

## 2020-05-19 DIAGNOSIS — E0822 Diabetes mellitus due to underlying condition with diabetic chronic kidney disease: Secondary | ICD-10-CM

## 2020-05-19 DIAGNOSIS — N1832 Chronic kidney disease, stage 3b: Secondary | ICD-10-CM

## 2020-05-19 DIAGNOSIS — Z23 Encounter for immunization: Secondary | ICD-10-CM

## 2020-05-19 DIAGNOSIS — E785 Hyperlipidemia, unspecified: Secondary | ICD-10-CM | POA: Diagnosis not present

## 2020-05-19 DIAGNOSIS — K219 Gastro-esophageal reflux disease without esophagitis: Secondary | ICD-10-CM

## 2020-05-19 DIAGNOSIS — N183 Chronic kidney disease, stage 3 unspecified: Secondary | ICD-10-CM

## 2020-05-19 DIAGNOSIS — I10 Essential (primary) hypertension: Secondary | ICD-10-CM | POA: Diagnosis not present

## 2020-05-19 MED ORDER — INSULIN ASPART 100 UNIT/ML FLEXPEN: PEN_INJECTOR | SUBCUTANEOUS | 11 refills | Status: DC

## 2020-05-19 NOTE — Progress Notes (Signed)
Careteam: Patient Care Team: Sharon Seller, NP as PCP - General (Geriatric Medicine) Van Clines, MD as Consulting Physician (Neurology)  PLACE OF SERVICE:  [facility]  Advanced Directive information Does Patient Have a Medical Advance Directive?: Yes, Type of Advance Directive: Out of facility DNR (pink MOST or yellow form), Pre-existing out of facility DNR order (yellow form or pink MOST form): Yellow form placed in chart (order not valid for inpatient use);Pink MOST form placed in chart (order not valid for inpatient use), Does patient want to make changes to medical advance directive?: No - Patient declined  Allergies  Allergen Reactions  . Oxycodone Other (See Comments)    "I'm swimming; I'm floating; I'm diving; I'm in another world."  . Zyprexa [Olanzapine]     Increase in hallucination     Chief Complaint  Patient presents with  . Medical Management of Chronic Issues    6 month follow-up. Flu vaccine today. Discuss need for DEXA and TDaP/TD. Here with son.      HPI: Patient is a 74 y.o. female for routine follow up. Here with son.   DM- blood sugar in the morning avg is 107 but the evening is 248 She gets 5 units novolog with breakfast, 5 units with lunch and 10 with dinner.  Taking lantus 44 units in the evening. No low blood sugar Eats consistently for breakfast lunch and dinner.  Also taking metformin BID  GERD- no symptoms, controlled on nexium  htn- controlled on benicar and labetatol with torsemide 1/2 tablet daily   Anemia- continues on iron supplement  Mood has been stable. Sleeping well. No increase in behaviors or hallucinations noted.   Review of Systems:  Review of Systems  Constitutional: Negative for chills, fever and weight loss.  HENT: Negative for tinnitus.   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   Cardiovascular: Negative for chest pain,  palpitations and leg swelling.  Gastrointestinal: Negative for abdominal pain, constipation, diarrhea and heartburn.  Genitourinary: Negative for dysuria, frequency and urgency.  Musculoskeletal: Negative for back pain, falls, joint pain and myalgias.  Skin: Negative.   Neurological: Negative for dizziness and headaches.  Psychiatric/Behavioral: Negative for depression, hallucinations and memory loss. The patient is not nervous/anxious and does not have insomnia.     Past Medical History:  Diagnosis Date  . Anemia of chronic disease   . Arthritis   . Back pain, chronic   . Blind   . Candida infection of mouth   . Diabetes mellitus   . Esophageal reflux   . GERD (gastroesophageal reflux disease)   . Hallucination   . Hand pain, left   . Hyperlipidemia   . Hypertension   . Hypertensive renal disease   . Insomnia   . Morbid obesity (HCC)   . OAB (overactive bladder)   . Osteoarthritis    left shoulder  . Poor circulation   . PVD (peripheral vascular disease) (HCC)   . Retinal detachment 11/2012  . Senile dementia with delusional features (HCC)   . Senile purpura (HCC)   . Vitamin D deficiency    Past Surgical History:  Procedure Laterality Date  . CATARACT EXTRACTION, BILATERAL    . INCISION AND DRAINAGE PERIRECTAL ABSCESS  11/20/2011   Procedure: IRRIGATION AND DEBRIDEMENT PERIRECTAL ABSCESS;  Surgeon: Liz Malady, MD;  Location: [facility] OR;  Service: General;  Laterality: Right;  Incision and Drainage of Right Groin Abscess   Social History:   reports that she quit smoking about 43 years ago. Her smoking use included cigarettes. She has a 3.75 pack-year smoking history. She has never used smokeless tobacco. She reports that she does not drink alcohol and does not use drugs.  Family History  Problem Relation Age of Onset  . Heart disease Mother   . Diabetes Mother   . Hyperlipidemia Son   . Hypertension Son     Medications: Patient's Medications  New Prescriptions     No medications on file  Previous Medications   AMLODIPINE (NORVASC) 10 MG TABLET    TAKE 1 TABLET(10 MG) BY MOUTH DAILY   ASPIRIN EC 81 MG TABLET    Take 81 mg by mouth daily.   ATORVASTATIN (LIPITOR) 20 MG TABLET    TAKE 1 TABLET(20 MG) BY MOUTH DAILY AT 6 PM   CALCIUM PO    Take 1,000 mg by mouth 2 (two) times daily.    CILOSTAZOL (PLETAL) 100 MG TABLET    TAKE 1/2 TABLET BY MOUTH EVERY MORNING   CONTINUOUS BLOOD GLUC RECEIVER (FREESTYLE LIBRE 14 DAY READER) DEVI    1 each by Does not apply route daily as needed. E11.8   CONTINUOUS BLOOD GLUC SENSOR (FREESTYLE LIBRE 14 DAY SENSOR) MISC    1 each by Does not apply route daily as needed. E11.8   DOCUSATE SODIUM (COLACE) 100 MG CAPSULE    Take 100 mg by mouth 2 (two) times daily. Stool softener   ESOMEPRAZOLE (NEXIUM) 20 MG CAPSULE    Take 20 mg by mouth every morning.   INSULIN ASPART (NOVOLOG) 100 UNIT/ML FLEXPEN    5 units with breakfast and lunch and 10 units with dinner   INSULIN PEN NEEDLE 31G X 5 MM MISC    Check blood sugar before meals and bedtime DX) E11.8   INSULIN SYRINGE-NEEDLE U-100 (INSULIN SYRINGE 1CC/31GX5/16") 31G X 5/16" 1 ML MISC    1 Syringe by Does not apply route at bedtime. Dx: E11.40   IRON PO    Take 1 tablet by mouth every evening.    LABETALOL (NORMODYNE) 100 MG TABLET    TAKE 1 TABLET(100 MG) BY MOUTH TWICE DAILY   LANTUS 100 UNIT/ML INJECTION    INJECT 44 UNITS INTO THE SKIN AT BEDTIME   METFORMIN (GLUCOPHAGE) 1000 MG TABLET    TAKE 1 TABLET(1000 MG) BY MOUTH DAILY WITH BREAKFAST   OLMESARTAN (BENICAR) 40 MG TABLET    TAKE 1 TABLET(40 MG) BY MOUTH DAILY   TORSEMIDE (DEMADEX) 20 MG TABLET    TAKE 1/2 TABLET(10 MG) BY MOUTH EVERY MORNING  Modified Medications   No medications on file  Discontinued Medications   No medications on file    Physical Exam:  Vitals:   05/19/20 1107  BP: 128/76  Pulse: 65  Temp: (!) 96.8 F (36 C)  TempSrc: Temporal  SpO2: 98%  Weight: 271 lb (122.9 kg)  Height: 5\' 6"   (1.676 m)   Body mass index is 43.74 kg/m. Wt Readings from Last 3 Encounters:  05/19/20 271 lb (122.9 kg)  12/29/19 280 lb (127 kg)  11/19/19 264 lb (119.7 kg)    Physical Exam Constitutional:      General: She is not in acute distress.    Appearance: She is well-developed. She is not diaphoretic.  HENT:     Head: Normocephalic and atraumatic.     Mouth/Throat:     Pharynx: No  oropharyngeal exudate.  Cardiovascular:     Rate and Rhythm: Normal rate and regular rhythm.     Heart sounds: Normal heart sounds.  Pulmonary:     Effort: Pulmonary effort is normal.     Breath sounds: Normal breath sounds.  Abdominal:     General: Bowel sounds are normal.     Palpations: Abdomen is soft.  Musculoskeletal:        General: No tenderness.     Cervical back: Normal range of motion and neck supple.     Right lower leg: No edema.     Left lower leg: No edema.  Skin:    General: Skin is warm and dry.  Neurological:     Mental Status: She is alert. Mental status is at baseline.  Psychiatric:        Mood and Affect: Mood normal.        Behavior: Behavior normal.    Labs reviewed: Basic Metabolic Panel: Recent Labs    07/14/19 1514 11/19/19 1151  NA 145 140  K 4.6 4.7  CL 110 108  CO2 28 26  GLUCOSE 82 120*  BUN 24 32*  CREATININE 1.49* 1.52*  CALCIUM 9.9 9.6   Liver Function Tests: Recent Labs    07/14/19 1514 11/19/19 1151  AST 12 12  ALT 8 10  BILITOT 0.3 0.3  PROT 5.8* 6.0*   No results for input(s): LIPASE, AMYLASE in the last 8760 hours. No results for input(s): AMMONIA in the last 8760 hours. CBC: Recent Labs    07/14/19 1514 11/19/19 1151  WBC 4.3 3.8  NEUTROABS 2,025 1,566  HGB 11.2* 11.5*  HCT 34.9* 36.0  MCV 91.8 91.8  PLT 221 212   Lipid Panel: No results for input(s): CHOL, HDL, LDLCALC, TRIG, CHOLHDL, LDLDIRECT in the last 8760 hours. TSH: No results for input(s): TSH in the last 8760 hours. A1C: Lab Results  Component Value Date    HGBA1C 8.5 (H) 11/19/2019     Assessment/Plan 1. Need for influenza vaccination - Flu Vaccine QUAD High Dose(Fluad)  2. 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 Will continue lantus and metformin but increase novolog to cover lunch and supper.  -Encouraged dietary compliance, routine foot care/monitoring and to keep up with diabetic eye exams through ophthalmology  - Hemoglobin A1c - insulin aspart (NOVOLOG) 100 UNIT/ML FlexPen; 5 units with breakfast, 10 units with lunch and 12 units with supper.  Dispense: 15 mL; Refill: 11  3. Neuropathy Stable at this time.   4. Hyperlipidemia, unspecified hyperlipidemia type Continues on lipitor 20 mg daily, dietary modifications encouraged, she is wheelchair bound so exercise is limited. - Lipid Panel  5. Essential hypertension, benign -well controlled on current regimen, continue diet modifications.  - CBC with Differential/Platelet - COMPLETE METABOLIC PANEL WITH GFR  6. PVD -stable on asa and pletal, without worsening of symptoms.   7. Gastroesophageal reflux disease without esophagitis Controlled on nexium  8. Hallucination -stable, without worsening of symptoms.   9. Stage 3 chronic kidney disease, unspecified whether stage 3a or 3b CKD (HCC) -Encourage proper hydration and to avoid NSAIDS (Aleve, Advil, Motrin, Ibuprofen)    Next appt: 3 months.  Janene Harvey. Biagio Borg  [facility] [phone]

## 2020-05-19 NOTE — Patient Instructions (Addendum)
Bone density over due- this is done at the same place as a mammogram Call [facility] to schedule.   Increase novolog to 10 units at lunch and 12 units at supper  To get TDAP vaccine at local pharmacy also make appt for COVID booster.

## 2020-05-20 LAB — COMPLETE METABOLIC PANEL WITH GFR
AG Ratio: 1.4 (calc) (ref 1.0–2.5)
ALT: 11 U/L (ref 6–29)
AST: 15 U/L (ref 10–35)
Albumin: 3.4 g/dL — ABNORMAL LOW (ref 3.6–5.1)
Alkaline phosphatase (APISO): 53 U/L (ref 37–153)
BUN/Creatinine Ratio: 18 (calc) (ref 6–22)
BUN: 22 mg/dL (ref 7–25)
CO2: 24 mmol/L (ref 20–32)
Calcium: 9.6 mg/dL (ref 8.6–10.4)
Chloride: 109 mmol/L (ref 98–110)
Creat: 1.22 mg/dL — ABNORMAL HIGH (ref 0.60–0.93)
GFR, Est African American: 50 mL/min/{1.73_m2} — ABNORMAL LOW (ref >=60)
GFR, Est Non African American: 43 mL/min/{1.73_m2} — ABNORMAL LOW (ref >=60)
Globulin: 2.4 g/dL (calc) (ref 1.9–3.7)
Glucose, Bld: 183 mg/dL — ABNORMAL HIGH (ref 65–99)
Potassium: 4.5 mmol/L (ref 3.5–5.3)
Sodium: 140 mmol/L (ref 135–146)
Total Bilirubin: 0.5 mg/dL (ref 0.2–1.2)
Total Protein: 5.8 g/dL — ABNORMAL LOW (ref 6.1–8.1)

## 2020-05-20 LAB — CBC WITH DIFFERENTIAL/PLATELET
Absolute Monocytes: 228 cells/uL (ref 200–950)
Basophils Absolute: 32 cells/uL (ref 0–200)
Basophils Relative: 0.9 %
Eosinophils Absolute: 119 cells/uL (ref 15–500)
Eosinophils Relative: 3.4 %
HCT: 33.4 % — ABNORMAL LOW (ref 35.0–45.0)
Hemoglobin: 10.7 g/dL — ABNORMAL LOW (ref 11.7–15.5)
Lymphs Abs: 1243 cells/uL (ref 850–3900)
MCH: 29.4 pg (ref 27.0–33.0)
MCHC: 32 g/dL (ref 32.0–36.0)
MCV: 91.8 fL (ref 80.0–100.0)
MPV: 11.6 fL (ref 7.5–12.5)
Monocytes Relative: 6.5 %
Neutro Abs: 1880 cells/uL (ref 1500–7800)
Neutrophils Relative %: 53.7 %
Platelets: 218 10*3/uL (ref 140–400)
RBC: 3.64 10*6/uL — ABNORMAL LOW (ref 3.80–5.10)
RDW: 13.4 % (ref 11.0–15.0)
Total Lymphocyte: 35.5 %
WBC: 3.5 10*3/uL — ABNORMAL LOW (ref 3.8–10.8)

## 2020-05-20 LAB — HEMOGLOBIN A1C
Hgb A1c MFr Bld: 8.2 % of total Hgb — ABNORMAL HIGH (ref <5.7)
Mean Plasma Glucose: 189 (calc)
eAG (mmol/L): 10.4 (calc)

## 2020-05-20 LAB — LIPID PANEL
Cholesterol: 127 mg/dL (ref <200)
HDL: 67 mg/dL (ref >=50)
LDL Cholesterol (Calc): 45 mg/dL (calc)
Non-HDL Cholesterol (Calc): 60 mg/dL (calc) (ref <130)
Total CHOL/HDL Ratio: 1.9 (calc) (ref <5.0)
Triglycerides: 73 mg/dL (ref <150)

## 2020-06-08 ENCOUNTER — Other Ambulatory Visit: Payer: Self-pay | Admitting: Nurse Practitioner

## 2020-06-08 DIAGNOSIS — Z794 Long term (current) use of insulin: Secondary | ICD-10-CM

## 2020-06-08 DIAGNOSIS — E785 Hyperlipidemia, unspecified: Secondary | ICD-10-CM

## 2020-06-08 DIAGNOSIS — N1832 Chronic kidney disease, stage 3b: Secondary | ICD-10-CM

## 2020-06-08 DIAGNOSIS — I10 Essential (primary) hypertension: Secondary | ICD-10-CM

## 2020-06-16 ENCOUNTER — Other Ambulatory Visit: Payer: Self-pay | Admitting: Nurse Practitioner

## 2020-06-16 DIAGNOSIS — I10 Essential (primary) hypertension: Secondary | ICD-10-CM

## 2020-06-22 ENCOUNTER — Other Ambulatory Visit: Payer: Self-pay | Admitting: Nurse Practitioner

## 2020-06-29 ENCOUNTER — Other Ambulatory Visit: Payer: Self-pay

## 2020-06-29 DIAGNOSIS — E0822 Diabetes mellitus due to underlying condition with diabetic chronic kidney disease: Secondary | ICD-10-CM

## 2020-06-29 DIAGNOSIS — N1832 Chronic kidney disease, stage 3b: Secondary | ICD-10-CM

## 2020-06-29 NOTE — Addendum Note (Signed)
Addended by: Meda Klinefelter E on: 06/29/2020 05:00 PM   Modules accepted: Orders

## 2020-06-29 NOTE — Telephone Encounter (Signed)
I'm unsure which version of this medication to send in. Please Advise.

## 2020-06-29 NOTE — Telephone Encounter (Signed)
Can we see if the dexcom is covered? Thank you

## 2020-06-29 NOTE — Telephone Encounter (Signed)
Patient pharmacy states that FREE STYLE LIBRE isn't covered under insurance. Alternative is "[employer] PREFERRED PRODUCT REQUIRED." Message routed to Sharon Seller, NP . Please Advise.

## 2020-06-29 NOTE — Telephone Encounter (Signed)
If insurance doesn't cover medication we will most likely receive a fax stating rejection. Order placed and pending. Message routed to PCP Janyth Contes, Janene Harvey, NP .

## 2020-06-29 NOTE — Telephone Encounter (Signed)
Dexcom is a continuous blood sugar monitoring system similar to freestyle libra.

## 2020-06-30 MED ORDER — DEXCOM G6 SENSOR MISC: 6 refills | Status: AC

## 2020-06-30 MED ORDER — DEXCOM G6 RECEIVER DEVI: 0 refills | Status: AC

## 2020-06-30 MED ORDER — DEXCOM G6 TRANSMITTER MISC: 6 refills | Status: AC

## 2020-06-30 NOTE — Addendum Note (Signed)
Addended by: Nelda Severe A on: 06/30/2020 08:58 AM   Modules accepted: Orders

## 2020-06-30 NOTE — Telephone Encounter (Signed)
Pharmacy requested refill

## 2020-08-18 ENCOUNTER — Ambulatory Visit: Payer: Medicare HMO | Admitting: Nurse Practitioner

## 2020-08-25 ENCOUNTER — Ambulatory Visit: Payer: Medicare HMO | Admitting: Nurse Practitioner

## 2020-09-22 ENCOUNTER — Ambulatory Visit: Payer: Medicare HMO | Admitting: Nurse Practitioner

## 2020-09-25 ENCOUNTER — Ambulatory Visit: Payer: Medicare HMO | Admitting: Nurse Practitioner

## 2020-09-25 ENCOUNTER — Other Ambulatory Visit: Payer: Self-pay

## 2020-09-25 ENCOUNTER — Telehealth (INDEPENDENT_AMBULATORY_CARE_PROVIDER_SITE_OTHER): Payer: Medicare HMO | Admitting: Nurse Practitioner

## 2020-09-25 ENCOUNTER — Encounter: Payer: Self-pay | Admitting: Nurse Practitioner

## 2020-09-25 DIAGNOSIS — N1832 Chronic kidney disease, stage 3b: Secondary | ICD-10-CM

## 2020-09-25 DIAGNOSIS — E1140 Type 2 diabetes mellitus with diabetic neuropathy, unspecified: Secondary | ICD-10-CM | POA: Diagnosis not present

## 2020-09-25 DIAGNOSIS — E11319 Type 2 diabetes mellitus with unspecified diabetic retinopathy without macular edema: Secondary | ICD-10-CM | POA: Diagnosis not present

## 2020-09-25 DIAGNOSIS — Z794 Long term (current) use of insulin: Secondary | ICD-10-CM

## 2020-09-25 DIAGNOSIS — I10 Essential (primary) hypertension: Secondary | ICD-10-CM | POA: Diagnosis not present

## 2020-09-25 DIAGNOSIS — E785 Hyperlipidemia, unspecified: Secondary | ICD-10-CM | POA: Diagnosis not present

## 2020-09-25 DIAGNOSIS — E0822 Diabetes mellitus due to underlying condition with diabetic chronic kidney disease: Secondary | ICD-10-CM | POA: Diagnosis not present

## 2020-09-25 DIAGNOSIS — I739 Peripheral vascular disease, unspecified: Secondary | ICD-10-CM | POA: Diagnosis not present

## 2020-09-25 DIAGNOSIS — R443 Hallucinations, unspecified: Secondary | ICD-10-CM

## 2020-09-25 MED ORDER — INSULIN ASPART 100 UNIT/ML FLEXPEN: PEN_INJECTOR | SUBCUTANEOUS | 11 refills | Status: AC

## 2020-09-25 NOTE — Progress Notes (Signed)
Careteam: Patient Care Team: Sharon Seller, NP as PCP - General (Geriatric Medicine) Van Clines, MD as Consulting Physician (Neurology)  Advanced Directive information Does Patient Have a Medical Advance Directive?: Yes, Type of Advance Directive: Out of facility DNR (pink MOST or yellow form), Pre-existing out of facility DNR order (yellow form or pink MOST form): Yellow form placed in chart (order not valid for inpatient use);Pink MOST form placed in chart (order not valid for inpatient use), Does patient want to make changes to medical advance directive?: No - Patient declined  Allergies  Allergen Reactions  . Oxycodone Other (See Comments)    "I'm swimming; I'm floating; I'm diving; I'm in another world."  . Zyprexa [Olanzapine]     Increase in hallucination     Chief Complaint  Patient presents with  . Medical Management of Chronic Issues    3 month follow-up via telephone/video visit. Discuss need for Dexa, td/tdap, and covid booster or exclude. Discuss increase in behavior episodes. Discuss FMLA paperwork to be dropped off later today.      HPI: Patient is a 74 y.o. female for routine follow up.  Son assisting with visit.  Reports hallucination episodes are more frequent and she is disoriented. She is arguing with ppl that are not there.  In the past there has not been medication that has helped.  Family has not been able to get her in with the psychiatrist.  She will have episodes for 2 days then be "normal" for 2 days. Hard to get her to appts.   DM- last a1c 8.2, fasting blood sugars 120-130, this morning 117. Blood sugar in the evening is 240-260, prior to supper. Prior to lunch is 100-160. No hypoglycemia.   htn- does not check blood pressure.  No complaints of headaches or chest pain.  Insomnia- reports she is sleeping well at night.      Review of Systems:  Review of Systems  Constitutional: Negative for chills, fever and weight loss.  HENT:  Negative for tinnitus.   Eyes:       Blind  Respiratory: Negative for cough, sputum production and shortness of breath.   Cardiovascular: Negative for chest pain, palpitations and leg swelling.  Gastrointestinal: Negative for abdominal pain, constipation, diarrhea and heartburn.  Genitourinary: Negative for dysuria, frequency and urgency.  Musculoskeletal: Negative for back pain, falls, joint pain and myalgias.  Skin: Negative.   Neurological: Positive for tingling and sensory change. Negative for dizziness and headaches.  Psychiatric/Behavioral: Positive for hallucinations. Negative for depression and memory loss. The patient does not have insomnia.     Past Medical History:  Diagnosis Date  . Anemia of chronic disease   . Arthritis   . Back pain, chronic   . Blind   . Candida infection of mouth   . Diabetes mellitus   . Esophageal reflux   . GERD (gastroesophageal reflux disease)   . Hallucination   . Hand pain, left   . Hyperlipidemia   . Hypertension   . Hypertensive renal disease   . Insomnia   . Morbid obesity (HCC)   . OAB (overactive bladder)   . Osteoarthritis    left shoulder  . Poor circulation   . PVD (peripheral vascular disease) (HCC)   . Retinal detachment 11/2012  . Senile dementia with delusional features (HCC)   . Senile purpura (HCC)   . Vitamin D deficiency    Past Surgical History:  Procedure Laterality Date  . CATARACT EXTRACTION,  BILATERAL    . INCISION AND DRAINAGE PERIRECTAL ABSCESS  11/20/2011   Procedure: IRRIGATION AND DEBRIDEMENT PERIRECTAL ABSCESS;  Surgeon: Liz Malady, MD;  Location: [facility] OR;  Service: General;  Laterality: Right;  Incision and Drainage of Right Groin Abscess   Social History:   reports that she quit smoking about 44 years ago. Her smoking use included cigarettes. She has a 3.75 pack-year smoking history. She has never used smokeless tobacco. She reports that she does not drink alcohol and does not use drugs.  Family  History  Problem Relation Age of Onset  . Heart disease Mother   . Diabetes Mother   . Hyperlipidemia Son   . Hypertension Son     Medications: Patient's Medications  New Prescriptions   No medications on file  Previous Medications   AMLODIPINE (NORVASC) 10 MG TABLET    TAKE 1 TABLET(10 MG) BY MOUTH DAILY   ASPIRIN EC 81 MG TABLET    Take 81 mg by mouth daily.   ATORVASTATIN (LIPITOR) 20 MG TABLET    TAKE 1 TABLET(20 MG) BY MOUTH DAILY AT 6 PM   CALCIUM PO    Take 1,000 mg by mouth 2 (two) times daily.    CILOSTAZOL (PLETAL) 100 MG TABLET    TAKE 1/2 TABLET BY MOUTH EVERY MORNING   CONTINUOUS BLOOD GLUC RECEIVER (DEXCOM G6 RECEIVER) DEVI    Use to test blood sugar three times daily. Dx: E08.21   CONTINUOUS BLOOD GLUC SENSOR (DEXCOM G6 SENSOR) MISC    Use to test blood sugar three times daily. Dx: E08.21   CONTINUOUS BLOOD GLUC TRANSMIT (DEXCOM G6 TRANSMITTER) MISC    Use to test blood sugar three times daily. Dx: E08.21   DOCUSATE SODIUM (COLACE) 100 MG CAPSULE    Take 100 mg by mouth 2 (two) times daily. Stool softener   ESOMEPRAZOLE (NEXIUM) 20 MG CAPSULE    Take 20 mg by mouth every morning.   INSULIN ASPART (NOVOLOG) 100 UNIT/ML FLEXPEN    5 units with breakfast, 10 units with lunch and 12 units with supper.   INSULIN PEN NEEDLE 31G X 5 MM MISC    Check blood sugar before meals and bedtime DX) E11.8   INSULIN SYRINGE-NEEDLE U-100 (INSULIN SYRINGE 1CC/31GX5/16") 31G X 5/16" 1 ML MISC    1 Syringe by Does not apply route at bedtime. Dx: E11.40   IRON PO    Take 1 tablet by mouth every evening.    LABETALOL (NORMODYNE) 100 MG TABLET    TAKE 1 TABLET(100 MG) BY MOUTH TWICE DAILY   LANTUS 100 UNIT/ML INJECTION    INJECT 44 UNITS INTO THE SKIN AT BEDTIME   METFORMIN (GLUCOPHAGE) 1000 MG TABLET    TAKE 1 TABLET(1000 MG) BY MOUTH DAILY WITH BREAKFAST   OLMESARTAN (BENICAR) 40 MG TABLET    TAKE 1 TABLET(40 MG) BY MOUTH DAILY   TORSEMIDE (DEMADEX) 20 MG TABLET    TAKE 1/2 TABLET(10 MG) BY  MOUTH EVERY MORNING  Modified Medications   No medications on file  Discontinued Medications   No medications on file    Physical Exam:  There were no vitals filed for this visit. There is no height or weight on file to calculate BMI. Wt Readings from Last 3 Encounters:  05/19/20 271 lb (122.9 kg)  12/29/19 280 lb (127 kg)  11/19/19 264 lb (119.7 kg)      Labs reviewed: Basic Metabolic Panel: Recent Labs    11/19/19 1151 05/19/20 1130  NA 140 140  K 4.7 4.5  CL 108 109  CO2 26 24  GLUCOSE 120* 183*  BUN 32* 22  CREATININE 1.52* 1.22*  CALCIUM 9.6 9.6   Liver Function Tests: Recent Labs    11/19/19 1151 05/19/20 1130  AST 12 15  ALT 10 11  BILITOT 0.3 0.5  PROT 6.0* 5.8*   No results for input(s): LIPASE, AMYLASE in the last 8760 hours. No results for input(s): AMMONIA in the last 8760 hours. CBC: Recent Labs    11/19/19 1151 05/19/20 1130  WBC 3.8 3.5*  NEUTROABS 1,566 1,880  HGB 11.5* 10.7*  HCT 36.0 33.4*  MCV 91.8 91.8  PLT 212 218   Lipid Panel: Recent Labs    05/19/20 1130  CHOL 127  HDL 67  LDLCALC 45  TRIG 73  CHOLHDL 1.9   TSH: No results for input(s): TSH in the last 8760 hours. A1C: Lab Results  Component Value Date   HGBA1C 8.2 (H) 05/19/2020     Assessment/Plan 1. 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 No hypoglycemia.  -will increase novolog to 13 units at lunch and supper.  - Hemoglobin A1c; Future -Encouraged dietary compliance, routine foot care/monitoring and to keep up with diabetic eye exams through ophthalmology  - insulin aspart (NOVOLOG) 100 UNIT/ML FlexPen; 5 units with breakfast, 13 units with lunch and 13 units with supper.  Dispense: 15 mL; Refill: 11  2. Type 2 diabetes mellitus with diabetic neuropathy, with long-term current use of insulin (HCC) -stable  neuropathy .  - Hemoglobin A1c; Future - insulin aspart (NOVOLOG) 100 UNIT/ML FlexPen; 5 units with breakfast, 13 units with lunch and 13 units with supper.  Dispense: 15 mL; Refill: 11  3. Diabetic retinopathy of both eyes associated with type 2 diabetes mellitus, macular edema presence unspecified, unspecified retinopathy severity (HCC) -encouraged ophthalmology follow up.  - insulin aspart (NOVOLOG) 100 UNIT/ML FlexPen; 5 units with breakfast, 13 units with lunch and 13 units with supper.  Dispense: 15 mL; Refill: 11  4. Hyperlipidemia, unspecified hyperlipidemia type -continues on lipitor daily  - COMPLETE METABOLIC PANEL WITH GFR; Future  5. Hallucination -worsening symptoms. Has been on Seroquel and Zyprexa in the past without benefit. Have been encouraging family to get her in with a psychiatrist. Educated they can try to request video visit if she will not leave the house.    6. Essential hypertension, benign -continues on benicar and labetalol. Encouraged to check bp at home. Goal <140/90 - CBC with Differential/Platelet; Future - COMPLETE METABOLIC PANEL WITH GFR; Future  7. PVD -stable on pletal - CBC with Differential/Platelet; Future    Virtual Visit via mychart  I connected with patient on 09/25/20 at 10:30 AM EST by video and verified that I am speaking with the correct person using two identifiers.  Location: Patient: home Provider: [facility]    I discussed the limitations, risks, security and privacy concerns of performing an evaluation and management service by telephone and the availability of in person appointments. I also discussed with the patient that there may be a patient responsible charge related to this service. The patient expressed understanding and agreed to proceed.   I discussed the assessment and treatment plan with the patient. The patient was provided an opportunity to ask questions and all were answered. The patient agreed with the plan and  demonstrated an understanding  of the instructions.   The patient was advised to call back or seek an in-person evaluation if the symptoms worsen or if the condition fails to improve as anticipated.  I provided 25 minutes of non-face-to-face time during this encounter.  Janene HarveyJessica K. Biagio BorgEubanks, AGNP Avs printed and mailed

## 2020-09-25 NOTE — Progress Notes (Signed)
   This service is provided via telemedicine  No vital signs collected/recorded due to the encounter was a telemedicine visit.   Location of patient (ex: home, work):  Home  Patient consents to a telephone visit: Yes, see telephone visit dated 01/23/2021  Location of the provider (ex: office, home):  [facility], Office   Name of any referring provider:  N/A  Names of all persons participating in the telemedicine service and their role in the encounter:  S.Chrae B/CMA, [name], NP, Marla Roe (son), and Patient   Time spent on call:  9 min with medical assistant

## 2020-09-29 DIAGNOSIS — Z029 Encounter for administrative examinations, unspecified: Secondary | ICD-10-CM

## 2020-10-02 ENCOUNTER — Telehealth: Payer: Self-pay | Admitting: *Deleted

## 2020-10-02 NOTE — Telephone Encounter (Signed)
Patient had dropped off FMLA Paperwork for [name] for [name] #[phone] Fax:[phone] Case #: [phone] Granville Lewis filled out paperwork and gave to me to call patient.  Charge of $25 attached and patient aware.  Left paperwork up front for pick up  Copy sent for scanning.

## 2020-10-29 ENCOUNTER — Other Ambulatory Visit: Payer: Self-pay | Admitting: Nurse Practitioner

## 2020-10-29 DIAGNOSIS — Z794 Long term (current) use of insulin: Secondary | ICD-10-CM

## 2020-10-29 DIAGNOSIS — E1140 Type 2 diabetes mellitus with diabetic neuropathy, unspecified: Secondary | ICD-10-CM

## 2020-12-05 ENCOUNTER — Other Ambulatory Visit: Payer: Self-pay | Admitting: Nurse Practitioner

## 2020-12-05 DIAGNOSIS — I10 Essential (primary) hypertension: Secondary | ICD-10-CM

## 2020-12-05 DIAGNOSIS — Z794 Long term (current) use of insulin: Secondary | ICD-10-CM

## 2020-12-05 DIAGNOSIS — E785 Hyperlipidemia, unspecified: Secondary | ICD-10-CM

## 2021-01-06 DIAGNOSIS — R404 Transient alteration of awareness: Secondary | ICD-10-CM | POA: Diagnosis not present

## 2021-01-06 DIAGNOSIS — I469 Cardiac arrest, cause unspecified: Secondary | ICD-10-CM | POA: Diagnosis not present

## 2021-01-06 DIAGNOSIS — I499 Cardiac arrhythmia, unspecified: Secondary | ICD-10-CM | POA: Diagnosis not present

## 2021-01-06 DIAGNOSIS — E1165 Type 2 diabetes mellitus with hyperglycemia: Secondary | ICD-10-CM | POA: Diagnosis not present

## 2021-01-10 DIAGNOSIS — 419620001 Death: Secondary | SNOMED CT | POA: Diagnosis not present

## 2021-01-10 DEATH — deceased

## 2021-01-12 ENCOUNTER — Telehealth: Payer: Self-pay | Admitting: Nurse Practitioner

## 2021-01-12 NOTE — Telephone Encounter (Signed)
Thank you for the update, death certificate signed.

## 2021-01-12 NOTE — Telephone Encounter (Signed)
I had christina from [facility] call and ask if Shanda Bumps would sign the death certificate for [name] (12/06/20) and I told her we had not received any notification that the patient had passed away. She told me the patient passed away at home and EMS was called out and she gave me the [place] at [facility] phone number so I then called him and asked him if he could give me any information on the patient and what happened, what the cause of death was. He told me they pronounced it as a natural death, there was not circumstances around the even that said otherwise and there was no trauma to the patient. They did try CPR, and it was not successful.

## 2021-01-24 ENCOUNTER — Encounter: Payer: Medicare HMO | Admitting: Nurse Practitioner

## 2021-09-01 ENCOUNTER — Other Ambulatory Visit: Payer: Self-pay | Admitting: Nurse Practitioner

## 2021-09-01 DIAGNOSIS — I10 Essential (primary) hypertension: Secondary | ICD-10-CM
